# Patient Record
Sex: Female | Born: 1985 | Race: White | Hispanic: No | Marital: Married | Smoking: Never smoker
Health system: Southern US, Community
[De-identification: ages and names within clinical notes are randomized; demographics above are authoritative.]

## PROBLEM LIST (undated history)

## (undated) ENCOUNTER — Inpatient Hospital Stay (HOSPITAL_COMMUNITY): Payer: Self-pay

## (undated) DIAGNOSIS — Z973 Presence of spectacles and contact lenses: Secondary | ICD-10-CM

## (undated) DIAGNOSIS — R1310 Dysphagia, unspecified: Secondary | ICD-10-CM

## (undated) DIAGNOSIS — F329 Major depressive disorder, single episode, unspecified: Secondary | ICD-10-CM

## (undated) DIAGNOSIS — N289 Disorder of kidney and ureter, unspecified: Secondary | ICD-10-CM

## (undated) DIAGNOSIS — C4490 Unspecified malignant neoplasm of skin, unspecified: Secondary | ICD-10-CM

## (undated) DIAGNOSIS — Z5189 Encounter for other specified aftercare: Secondary | ICD-10-CM

## (undated) DIAGNOSIS — T7840XA Allergy, unspecified, initial encounter: Secondary | ICD-10-CM

## (undated) DIAGNOSIS — Z9889 Other specified postprocedural states: Secondary | ICD-10-CM

## (undated) DIAGNOSIS — L309 Dermatitis, unspecified: Secondary | ICD-10-CM

## (undated) DIAGNOSIS — R112 Nausea with vomiting, unspecified: Secondary | ICD-10-CM

## (undated) DIAGNOSIS — K589 Irritable bowel syndrome without diarrhea: Secondary | ICD-10-CM

## (undated) DIAGNOSIS — F419 Anxiety disorder, unspecified: Secondary | ICD-10-CM

## (undated) DIAGNOSIS — D649 Anemia, unspecified: Secondary | ICD-10-CM

## (undated) DIAGNOSIS — C439 Malignant melanoma of skin, unspecified: Secondary | ICD-10-CM

## (undated) DIAGNOSIS — F32A Depression, unspecified: Secondary | ICD-10-CM

## (undated) DIAGNOSIS — N200 Calculus of kidney: Secondary | ICD-10-CM

## (undated) DIAGNOSIS — E282 Polycystic ovarian syndrome: Secondary | ICD-10-CM

## (undated) DIAGNOSIS — Z889 Allergy status to unspecified drugs, medicaments and biological substances status: Secondary | ICD-10-CM

## (undated) DIAGNOSIS — Z1371 Encounter for nonprocreative screening for genetic disease carrier status: Secondary | ICD-10-CM

## (undated) HISTORY — DX: Dermatitis, unspecified: L30.9

## (undated) HISTORY — DX: Unspecified malignant neoplasm of skin, unspecified: C44.90

## (undated) HISTORY — DX: Allergy, unspecified, initial encounter: T78.40XA

## (undated) HISTORY — DX: Depression, unspecified: F32.A

## (undated) HISTORY — PX: ABDOMINOPLASTY: SUR9

## (undated) HISTORY — PX: WISDOM TOOTH EXTRACTION: SHX21

## (undated) HISTORY — PX: COSMETIC SURGERY: SHX468

## (undated) HISTORY — DX: Disorder of kidney and ureter, unspecified: N28.9

## (undated) HISTORY — PX: OTHER SURGICAL HISTORY: SHX169

## (undated) HISTORY — DX: Malignant melanoma of skin, unspecified: C43.9

## (undated) HISTORY — DX: Major depressive disorder, single episode, unspecified: F32.9

## (undated) HISTORY — DX: Encounter for other specified aftercare: Z51.89

## (undated) HISTORY — PX: TONSILLECTOMY AND ADENOIDECTOMY: SHX28

## (undated) HISTORY — PX: TONSILLECTOMY: SUR1361

## (undated) HISTORY — DX: Encounter for nonprocreative screening for genetic disease carrier status: Z13.71

## (undated) HISTORY — DX: Anxiety disorder, unspecified: F41.9

## (undated) HISTORY — DX: Irritable bowel syndrome, unspecified: K58.9

## (undated) HISTORY — DX: Dysphagia, unspecified: R13.10

## (undated) HISTORY — PX: OVARIAN CYST REMOVAL: SHX89

## (undated) HISTORY — PX: DIAGNOSTIC LAPAROSCOPY: SUR761

---
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
## 2006-01-10 ENCOUNTER — Observation Stay: Payer: Self-pay | Admitting: Obstetrics and Gynecology

## 2006-03-24 ENCOUNTER — Observation Stay: Payer: Self-pay | Admitting: Obstetrics and Gynecology

## 2006-04-05 HISTORY — PX: OTHER SURGICAL HISTORY: SHX169

## 2006-04-12 ENCOUNTER — Inpatient Hospital Stay: Payer: Self-pay | Admitting: Obstetrics and Gynecology

## 2007-02-02 DIAGNOSIS — R635 Abnormal weight gain: Secondary | ICD-10-CM | POA: Insufficient documentation

## 2007-04-08 ENCOUNTER — Emergency Department: Payer: Self-pay | Admitting: Emergency Medicine

## 2007-05-14 ENCOUNTER — Emergency Department: Payer: Self-pay | Admitting: Emergency Medicine

## 2007-07-16 ENCOUNTER — Emergency Department: Payer: Self-pay | Admitting: Internal Medicine

## 2008-07-12 ENCOUNTER — Emergency Department: Payer: Self-pay | Admitting: Emergency Medicine

## 2008-10-23 ENCOUNTER — Ambulatory Visit: Payer: Self-pay | Admitting: Obstetrics and Gynecology

## 2008-10-23 ENCOUNTER — Encounter: Payer: Self-pay | Admitting: Obstetrics & Gynecology

## 2008-10-23 LAB — CONVERTED CEMR LAB
Antibody Screen: NEGATIVE
Basophils Absolute: 0 10*3/uL (ref 0.0–0.1)
Eosinophils Relative: 2 % (ref 0–5)
HCT: 36.9 % (ref 36.0–46.0)
Hemoglobin: 11.9 g/dL — ABNORMAL LOW (ref 12.0–15.0)
Lymphs Abs: 2.4 10*3/uL (ref 0.7–4.0)
Monocytes Absolute: 0.7 10*3/uL (ref 0.1–1.0)
Monocytes Relative: 11 % (ref 3–12)
Neutrophils Relative %: 51 % (ref 43–77)
Platelets: 177 10*3/uL (ref 150–400)
Rh Type: POSITIVE
WBC: 6.7 10*3/uL (ref 4.0–10.5)

## 2008-10-31 ENCOUNTER — Ambulatory Visit (HOSPITAL_COMMUNITY): Admission: RE | Admit: 2008-10-31 | Discharge: 2008-10-31 | Payer: Self-pay | Admitting: Gynecology

## 2008-11-05 ENCOUNTER — Ambulatory Visit: Payer: Self-pay | Admitting: Obstetrics & Gynecology

## 2008-11-05 ENCOUNTER — Other Ambulatory Visit: Admission: RE | Admit: 2008-11-05 | Discharge: 2008-11-05 | Payer: Self-pay | Admitting: Obstetrics and Gynecology

## 2008-11-05 ENCOUNTER — Encounter: Payer: Self-pay | Admitting: Obstetrics and Gynecology

## 2008-12-11 ENCOUNTER — Ambulatory Visit: Payer: Self-pay | Admitting: Obstetrics & Gynecology

## 2008-12-11 ENCOUNTER — Ambulatory Visit: Payer: Self-pay | Admitting: Obstetrics and Gynecology

## 2008-12-16 ENCOUNTER — Ambulatory Visit: Payer: Self-pay | Admitting: Obstetrics and Gynecology

## 2008-12-23 ENCOUNTER — Ambulatory Visit: Payer: Self-pay | Admitting: Obstetrics and Gynecology

## 2008-12-30 ENCOUNTER — Ambulatory Visit: Payer: Self-pay | Admitting: Obstetrics and Gynecology

## 2008-12-31 ENCOUNTER — Encounter: Payer: Self-pay | Admitting: Obstetrics and Gynecology

## 2009-01-08 ENCOUNTER — Ambulatory Visit (HOSPITAL_COMMUNITY): Admission: RE | Admit: 2009-01-08 | Discharge: 2009-01-08 | Payer: Self-pay | Admitting: Obstetrics & Gynecology

## 2009-01-09 ENCOUNTER — Ambulatory Visit: Payer: Self-pay | Admitting: Obstetrics and Gynecology

## 2009-01-21 ENCOUNTER — Ambulatory Visit: Payer: Self-pay | Admitting: Obstetrics & Gynecology

## 2009-01-22 ENCOUNTER — Encounter: Payer: Self-pay | Admitting: Obstetrics and Gynecology

## 2009-02-01 ENCOUNTER — Emergency Department: Payer: Self-pay | Admitting: Emergency Medicine

## 2009-02-12 ENCOUNTER — Ambulatory Visit: Payer: Self-pay | Admitting: Family Medicine

## 2009-03-04 ENCOUNTER — Ambulatory Visit: Payer: Self-pay | Admitting: Family Medicine

## 2009-03-04 ENCOUNTER — Inpatient Hospital Stay (HOSPITAL_COMMUNITY): Admission: AD | Admit: 2009-03-04 | Discharge: 2009-03-04 | Payer: Self-pay | Admitting: Obstetrics & Gynecology

## 2009-03-12 ENCOUNTER — Ambulatory Visit: Payer: Self-pay | Admitting: Obstetrics & Gynecology

## 2009-03-12 LAB — CONVERTED CEMR LAB
MCHC: 30.5 g/dL (ref 30.0–36.0)
Platelets: 163 10*3/uL (ref 150–400)

## 2009-03-31 ENCOUNTER — Ambulatory Visit: Payer: Self-pay | Admitting: Obstetrics & Gynecology

## 2009-04-22 ENCOUNTER — Ambulatory Visit: Payer: Self-pay | Admitting: Obstetrics & Gynecology

## 2009-05-04 DIAGNOSIS — IMO0001 Reserved for inherently not codable concepts without codable children: Secondary | ICD-10-CM

## 2009-05-04 DIAGNOSIS — Z5189 Encounter for other specified aftercare: Secondary | ICD-10-CM

## 2009-05-04 HISTORY — DX: Reserved for inherently not codable concepts without codable children: IMO0001

## 2009-05-04 HISTORY — DX: Encounter for other specified aftercare: Z51.89

## 2009-05-06 ENCOUNTER — Ambulatory Visit: Payer: Self-pay | Admitting: Obstetrics & Gynecology

## 2009-05-13 ENCOUNTER — Ambulatory Visit: Payer: Self-pay | Admitting: Obstetrics & Gynecology

## 2009-05-13 LAB — CONVERTED CEMR LAB
Chlamydia, DNA Probe: NEGATIVE
GC Probe Amp, Genital: NEGATIVE

## 2009-05-19 ENCOUNTER — Ambulatory Visit: Payer: Self-pay | Admitting: Obstetrics & Gynecology

## 2009-05-19 ENCOUNTER — Encounter: Payer: Self-pay | Admitting: Family Medicine

## 2009-05-19 ENCOUNTER — Observation Stay (HOSPITAL_COMMUNITY): Admission: RE | Admit: 2009-05-19 | Discharge: 2009-05-19 | Payer: Self-pay | Admitting: Family Medicine

## 2009-06-23 ENCOUNTER — Encounter (INDEPENDENT_AMBULATORY_CARE_PROVIDER_SITE_OTHER): Payer: Self-pay | Admitting: *Deleted

## 2009-06-23 LAB — CONVERTED CEMR LAB
HCT: 35.8 % — ABNORMAL LOW (ref 36.0–46.0)
RBC: 4.07 M/uL (ref 3.87–5.11)
WBC: 6.6 10*3/uL (ref 4.0–10.5)

## 2009-06-25 ENCOUNTER — Ambulatory Visit: Payer: Self-pay | Admitting: Obstetrics & Gynecology

## 2009-06-30 ENCOUNTER — Ambulatory Visit: Payer: Self-pay | Admitting: Obstetrics and Gynecology

## 2009-06-30 ENCOUNTER — Ambulatory Visit (HOSPITAL_COMMUNITY): Admission: RE | Admit: 2009-06-30 | Discharge: 2009-06-30 | Payer: Self-pay | Admitting: Obstetrics and Gynecology

## 2009-06-30 ENCOUNTER — Encounter: Payer: Self-pay | Admitting: Obstetrics & Gynecology

## 2009-06-30 LAB — CONVERTED CEMR LAB
Platelets: 280 10*3/uL (ref 150–400)
RDW: 16.6 % — ABNORMAL HIGH (ref 11.5–15.5)
WBC: 6.8 10*3/uL (ref 4.0–10.5)

## 2009-07-10 ENCOUNTER — Ambulatory Visit: Payer: Self-pay | Admitting: Obstetrics and Gynecology

## 2009-07-10 ENCOUNTER — Encounter: Payer: Self-pay | Admitting: Obstetrics & Gynecology

## 2009-07-10 LAB — CONVERTED CEMR LAB
HCT: 33.1 % — ABNORMAL LOW (ref 36.0–46.0)
Hemoglobin: 9.9 g/dL — ABNORMAL LOW (ref 12.0–15.0)
MCHC: 29.9 g/dL — ABNORMAL LOW (ref 30.0–36.0)
MCV: 87.1 fL (ref 78.0–100.0)
Platelets: 295 10*3/uL (ref 150–400)
RBC: 3.8 M/uL — ABNORMAL LOW (ref 3.87–5.11)
WBC: 6.7 10*3/uL (ref 4.0–10.5)

## 2009-11-13 ENCOUNTER — Ambulatory Visit: Payer: Self-pay | Admitting: Obstetrics & Gynecology

## 2009-11-14 ENCOUNTER — Encounter: Payer: Self-pay | Admitting: Family Medicine

## 2009-12-15 ENCOUNTER — Encounter: Payer: Self-pay | Admitting: Obstetrics & Gynecology

## 2010-03-23 ENCOUNTER — Encounter: Payer: Self-pay | Admitting: Family Medicine

## 2010-03-23 ENCOUNTER — Other Ambulatory Visit: Payer: Commercial Indemnity

## 2010-03-23 DIAGNOSIS — N949 Unspecified condition associated with female genital organs and menstrual cycle: Secondary | ICD-10-CM

## 2010-03-23 LAB — RPR: RPR Ser Ql: NONREACTIVE

## 2010-03-23 LAB — CONVERTED CEMR LAB: TSH: 1.041 microintl units/mL (ref 0.350–4.500)

## 2010-03-23 LAB — CBC: Platelets: 118 10*3/uL — ABNORMAL LOW (ref 150–400)

## 2010-03-30 LAB — GC/CHLAMYDIA PROBE AMP, GENITAL
Chlamydia, DNA Probe: NEGATIVE
GC Probe Amp, Genital: NEGATIVE

## 2010-03-30 LAB — URINALYSIS, ROUTINE W REFLEX MICROSCOPIC
Bilirubin Urine: NEGATIVE
Glucose, UA: NEGATIVE mg/dL
Hgb urine dipstick: NEGATIVE
Protein, ur: NEGATIVE mg/dL
Specific Gravity, Urine: 1.025 (ref 1.005–1.030)

## 2010-03-30 LAB — WET PREP, GENITAL
Trich, Wet Prep: NONE SEEN
Yeast Wet Prep HPF POC: NONE SEEN

## 2010-03-30 LAB — CBC
HCT: 30.8 % — ABNORMAL LOW (ref 36.0–46.0)
Hemoglobin: 10.2 g/dL — ABNORMAL LOW (ref 12.0–15.0)
MCHC: 33.2 g/dL (ref 30.0–36.0)
MCV: 91 fL (ref 78.0–100.0)
Platelets: 138 10*3/uL — ABNORMAL LOW (ref 150–400)
RBC: 3.39 MIL/uL — ABNORMAL LOW (ref 3.87–5.11)
RDW: 12.6 % (ref 11.5–15.5)
WBC: 10.3 10*3/uL (ref 4.0–10.5)

## 2010-03-30 LAB — STREP B DNA PROBE: Strep Group B Ag: NEGATIVE

## 2010-05-12 ENCOUNTER — Ambulatory Visit (INDEPENDENT_AMBULATORY_CARE_PROVIDER_SITE_OTHER): Payer: Commercial Indemnity | Admitting: Family Medicine

## 2010-05-12 DIAGNOSIS — N979 Female infertility, unspecified: Secondary | ICD-10-CM

## 2010-05-15 NOTE — Assessment & Plan Note (Signed)
NAME:  Debra Hernandez NO.:  [id number]  MEDICAL RECORD NO.:  [id number]           PATIENT TYPE:  LOCATION:  [facility] at [facility]           FACILITY:  PHYSICIAN:  Tinnie Gens, MD        DATE OF BIRTH:  01-Sep-1985  DATE OF SERVICE:  05/12/2010                                 CLINIC NOTE  CHIEF COMPLAINT:  Infertility consult.  HISTORY OF PRESENT ILLNESS:  The patient is a 25 year old gravida 2, para 2-0-0-1 who has a history of 8 months of infertility.  She has previously had 2 normal pregnancies and she had no trouble conceiving. Her last pregnancy ended in a neonatal demise, the baby was found to have hydropic change at the end of her pregnancy, she went to [facility] to deliver and the baby had a really low EF in the 5% to 7% range and was profoundly in heart failure, some breathing too did not make it. He lived approximately 12 days.  Had a C-section for that delivery as she would not go to tolerate labor.  She does not recall any other issues.  She did bleed rather profoundly post delivery and required blood transfusion.  She actually came into this office and we ordered an ultrasound that showed no retained products of conception.  She says she has not had a D and C or anything else.  She does not note any trouble from her C-section.  In reviewing her records, I do not see any indication that there is any trouble with section.  She is having monthly cycles, which are normal.  She has her same partner who is the father for other 2 kids.  So they have been trying to get pregnant for approximately 8 months.  She has been using ovulation prediction kits, which have shown her to be ovulating.  She does have timed intercourse. She is worried that her weight, which has gone up to 190 pounds and/or a history of PCOS may be causing her issues.  Lengthy discussion was had with this patient, approximately 25 months was spent doing counseling. We discussed with her the  three main causes of infertility including tubal problem, ovulation problem, and/or sperm problem.  She had a TSH recently back in March, which was normal.  She had another TSH in December, which was also normal.  I think that she has normal prolactin as well.  She has been using ovulation prediction kits, which showed her to be ovulating, so I do not think that is the problem. Because she had a surgery, I think we probably should look at an HSG just to be sure everything is fine and then make sure the cavity and tubes are normal and then semen analysis can be performed on her partner.  We will check FSH and AMH today.  If all of this comes back negative, we can discuss whether she would want to try Clomid versus referral to REI.  We did discuss few more factors as etiologies of  infertility. We discussed the possibility of endometriosis, but I  do not think that this is her problem.  She does not really have a history suggestive of this, but I do not  have another explanation for why she has not gotten pregnant, although we did discuss that really 12 months is the normal time for workup for infertility, but she seems fairly adamant that something must be wrong, so we will begin the workup process.          ______________________________ Tinnie Gens, MD    TP/MEDQ  D:  05/12/2010  T:  05/12/2010  Job:  [id number]

## 2010-05-19 NOTE — Assessment & Plan Note (Signed)
NAME:  Debra Hernandez, Debra Hernandez NO.:  [id number]   MEDICAL RECORD NO.:  [id number]          PATIENT TYPE:  POB   LOCATION:  [facility] at [facility]         FACILITY:  [facility]   PHYSICIAN:  Scheryl Darter, MD       DATE OF BIRTH:  1985/03/24   DATE OF SERVICE:  06/25/2009                                  CLINIC NOTE   The patient returns for postpartum visit.  She is postoperative from a  cesarean section performed at [facility] on May 20, 2009.  She was  delivered at 37 weeks' gestation due to fetal hydrops and hydramnios.  Baby was a female with a congestive heart failure, a congenital heart  condition, and baby has expired on Jun 01, 2009.  She is still waiting  for genetic results.  She requests a NuvaRing for contraception.  She  has a small amount of bleeding postpartum.  She says that her incision  is healing well.   PHYSICAL EXAMINATION:  GENERAL:  She appears well with normal affect.  VITAL SIGNS:  Weight is 201 pounds, BP 116/75, pulse 79.  ABDOMEN:  Nondistended.  No mass.  Well-healed transverse lower  abdominal incision.  PELVIC:  External genitalia, vagina, and cervix was notable for small  amount of vaginal bleeding.  Uterus, normal size, nontender, no mass.   Hemoglobin on June 23, 2009, was 10.8.  This is stable from March.  She  was anemic postoperatively and required transfusion.   IMPRESSION:  1. Postoperative from cesarean section.  2. Status post fetal death secondary to congenital heart condition.   PLAN:  Gave prescription for NuvaRing.  She will start this soon.  I  recommend she return for yearly exam in about 6 months.      Scheryl Darter, MD     JA/MEDQ  D:  06/25/2009  T:  06/25/2009  Job:  [id number]

## 2010-05-19 NOTE — Assessment & Plan Note (Signed)
NAME:  Debra Hernandez, FORNER NO.:  [id number]   MEDICAL RECORD NO.:  [id number]          PATIENT TYPE:  OUT   LOCATION:  [facility] at [facility]          FACILITY:  [facility]   PHYSICIAN:  Argentina Donovan, MD        DATE OF BIRTH:  11/14/1985   DATE OF SERVICE:  06/30/2009                                  CLINIC NOTE   The patient is a 25 year old gravida 2, para 1-1-0-1, who was delivered  at [facility] on May 20, 2009, of a hydroptic baby that lived 11 days,  she was delivered by cesarean section for fetal indication.  When she  got to her room after the C-section, she began bleeding heavily and  received 2 units of blood transfusion.  They did not take her back and  do any surgical procedure.  She then shortly thereafter start bleeding  for about a week and half to 2 weeks.  She did pump her breasts and  because the baby did live for 12 days and the date at the baby died she  stopped pumping and uses frozen cabbage leaves to treat the lactating  breasts.  She still continued to spot or bleed after the first couple  weeks on and off, sometimes heavy passage of clots, and other times just  spotting, and then few days before she came in here 1 week ago, the  bleeding picked up and was heavier.  She had a H and H at that time  which was hemoglobin of 10.8 with a hematocrit of 35.8.  She was given a  NuvaRing which she inserted but took out 2 days ago because of bleeding  got heavier, and kept washing out the NuvaRing.  Over this past day, she  has had hours where she had to change the pad every hour because it was  saturated and other times was slow down just a bit.  When she was  examined 1 week ago, there was just a small amount of blood in the  vagina.  Today, she was bleeding fairly briskly like a heavy period.  On  physical examination, the uterus palpated, pretty firm, I would say  upper limits of normal in size, but not appreciably boggy.  The cervix  was closed and there was  amount of blood extruding from the cervix was  similar to that of a moderate to heavy period, and it is highly unlikely  if she had any changes of counting at this point, but I am going to send  her for an ultrasound.  I am also going to put her on 800 p.o. of  Cytotec and progesterone, Provera 10 mg b.i.d.  If she continues to  bleed and this does not let up, I am going to have her come back in 2  days, then I think that D and C would be an order regardless of the  ultrasound result.   IMPRESSION:  Dysfunctional postpartum bleeding.  Physical exam is  generally normal.  The CBC, however, will be repeated today.           ______________________________  Argentina Donovan, MD     PR/MEDQ  D:  06/30/2009  T:  07/01/2009  Job:  [id number]

## 2010-06-03 ENCOUNTER — Other Ambulatory Visit (INDEPENDENT_AMBULATORY_CARE_PROVIDER_SITE_OTHER): Payer: Commercial Indemnity

## 2010-06-03 DIAGNOSIS — Z3480 Encounter for supervision of other normal pregnancy, unspecified trimester: Secondary | ICD-10-CM

## 2010-06-03 LAB — RPR: RPR: NONREACTIVE

## 2010-06-03 LAB — HIV ANTIBODY (ROUTINE TESTING W REFLEX): HIV: NONREACTIVE

## 2010-06-04 ENCOUNTER — Encounter (INDEPENDENT_AMBULATORY_CARE_PROVIDER_SITE_OTHER): Payer: Commercial Indemnity | Admitting: Family Medicine

## 2010-06-04 DIAGNOSIS — Z3480 Encounter for supervision of other normal pregnancy, unspecified trimester: Secondary | ICD-10-CM

## 2010-06-04 DIAGNOSIS — N912 Amenorrhea, unspecified: Secondary | ICD-10-CM

## 2010-06-05 ENCOUNTER — Other Ambulatory Visit: Payer: Commercial Indemnity

## 2010-06-10 ENCOUNTER — Other Ambulatory Visit: Payer: Self-pay | Admitting: Family Medicine

## 2010-06-10 ENCOUNTER — Other Ambulatory Visit (INDEPENDENT_AMBULATORY_CARE_PROVIDER_SITE_OTHER): Payer: Commercial Indemnity

## 2010-06-10 DIAGNOSIS — Z3480 Encounter for supervision of other normal pregnancy, unspecified trimester: Secondary | ICD-10-CM

## 2010-06-10 DIAGNOSIS — O3680X0 Pregnancy with inconclusive fetal viability, not applicable or unspecified: Secondary | ICD-10-CM

## 2010-06-17 ENCOUNTER — Encounter (INDEPENDENT_AMBULATORY_CARE_PROVIDER_SITE_OTHER): Payer: Commercial Indemnity | Admitting: Obstetrics and Gynecology

## 2010-06-17 ENCOUNTER — Ambulatory Visit (HOSPITAL_COMMUNITY): Payer: Commercial Indemnity

## 2010-06-17 ENCOUNTER — Ambulatory Visit (HOSPITAL_COMMUNITY)
Admission: RE | Admit: 2010-06-17 | Discharge: 2010-06-17 | Disposition: A | Discharge status: Home / Self Care | Payer: Commercial Indemnity | Source: Ambulatory Visit | Attending: Family Medicine | Admitting: Family Medicine

## 2010-06-17 DIAGNOSIS — O3680X0 Pregnancy with inconclusive fetal viability, not applicable or unspecified: Secondary | ICD-10-CM

## 2010-06-17 DIAGNOSIS — O09299 Supervision of pregnancy with other poor reproductive or obstetric history, unspecified trimester: Secondary | ICD-10-CM | POA: Insufficient documentation

## 2010-06-17 DIAGNOSIS — O352XX0 Maternal care for (suspected) hereditary disease in fetus, not applicable or unspecified: Secondary | ICD-10-CM | POA: Insufficient documentation

## 2010-06-17 DIAGNOSIS — O209 Hemorrhage in early pregnancy, unspecified: Secondary | ICD-10-CM | POA: Insufficient documentation

## 2010-06-17 DIAGNOSIS — Z113 Encounter for screening for infections with a predominantly sexual mode of transmission: Secondary | ICD-10-CM

## 2010-06-17 DIAGNOSIS — Z3480 Encounter for supervision of other normal pregnancy, unspecified trimester: Secondary | ICD-10-CM

## 2010-06-17 DIAGNOSIS — Z1272 Encounter for screening for malignant neoplasm of vagina: Secondary | ICD-10-CM

## 2010-06-18 ENCOUNTER — Other Ambulatory Visit (HOSPITAL_COMMUNITY): Payer: Commercial Indemnity

## 2010-06-18 ENCOUNTER — Ambulatory Visit (HOSPITAL_COMMUNITY): Admission: RE | Admit: 2010-06-18 | Payer: Commercial Indemnity | Source: Ambulatory Visit

## 2010-06-18 ENCOUNTER — Encounter: Payer: Commercial Indemnity | Admitting: Obstetrics & Gynecology

## 2010-07-16 ENCOUNTER — Encounter: Payer: Commercial Indemnity | Admitting: Obstetrics & Gynecology

## 2010-07-21 ENCOUNTER — Encounter (INDEPENDENT_AMBULATORY_CARE_PROVIDER_SITE_OTHER): Payer: Commercial Indemnity | Admitting: Family Medicine

## 2010-07-21 ENCOUNTER — Other Ambulatory Visit: Payer: Self-pay | Admitting: Family Medicine

## 2010-07-21 DIAGNOSIS — Z3682 Encounter for antenatal screening for nuchal translucency: Secondary | ICD-10-CM

## 2010-07-21 DIAGNOSIS — Z3480 Encounter for supervision of other normal pregnancy, unspecified trimester: Secondary | ICD-10-CM

## 2010-07-31 ENCOUNTER — Ambulatory Visit (HOSPITAL_COMMUNITY)
Admission: RE | Admit: 2010-07-31 | Discharge: 2010-07-31 | Disposition: A | Discharge status: Home / Self Care | Payer: Commercial Indemnity | Source: Ambulatory Visit | Attending: Family Medicine | Admitting: Family Medicine

## 2010-07-31 ENCOUNTER — Encounter (HOSPITAL_COMMUNITY): Payer: Self-pay

## 2010-07-31 ENCOUNTER — Other Ambulatory Visit: Payer: Self-pay | Admitting: Family Medicine

## 2010-07-31 ENCOUNTER — Other Ambulatory Visit: Payer: Self-pay | Admitting: Maternal and Fetal Medicine

## 2010-07-31 DIAGNOSIS — O351XX0 Maternal care for (suspected) chromosomal abnormality in fetus, not applicable or unspecified: Secondary | ICD-10-CM | POA: Insufficient documentation

## 2010-07-31 DIAGNOSIS — O2690 Pregnancy related conditions, unspecified, unspecified trimester: Secondary | ICD-10-CM

## 2010-07-31 DIAGNOSIS — O34219 Maternal care for unspecified type scar from previous cesarean delivery: Secondary | ICD-10-CM | POA: Insufficient documentation

## 2010-07-31 DIAGNOSIS — O352XX0 Maternal care for (suspected) hereditary disease in fetus, not applicable or unspecified: Secondary | ICD-10-CM | POA: Insufficient documentation

## 2010-07-31 DIAGNOSIS — O3510X0 Maternal care for (suspected) chromosomal abnormality in fetus, unspecified, not applicable or unspecified: Secondary | ICD-10-CM | POA: Insufficient documentation

## 2010-07-31 DIAGNOSIS — O09299 Supervision of pregnancy with other poor reproductive or obstetric history, unspecified trimester: Secondary | ICD-10-CM | POA: Insufficient documentation

## 2010-07-31 DIAGNOSIS — Z3682 Encounter for antenatal screening for nuchal translucency: Secondary | ICD-10-CM

## 2010-07-31 NOTE — ED Notes (Signed)
First screen drawn.

## 2010-07-31 NOTE — Progress Notes (Signed)
Genetic Counseling  High-Risk Gestation Note  Appointment Date:  07/31/2010 Referred By: Reva Bores, MD Date of Birth:  1985-03-28  Pregnancy History: G3P2001 Estimated Date of Delivery: 02/09/11 Estimated Gestational Age: [redacted]w[redacted]d  I met with Mrs. Daralene Milch for prenatal genetic counseling given a previous son with an identified gene variant in a gene associated with dilated cardiomyopathy.   Both family histories were reviewed and found to be contributory for a previous son who passed away at 12 days. Hydrops was identified at [redacted] weeks gestation, and the patient delivered at [redacted] weeks gestation. He reportedly had congestive heart failure. 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 Both the patient and her partner have had negative testing for the identified variant in the MYH7 gene. Dilated cardiomyopathy can include congestive heart failure, reduced cardiac output, arrhythmias, and/or thromboembolic disease and can be due to genetic or acquired causes. Various genes have been identified to be involved with familial dilated cardiomyopathy. 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 No additional relatives were reported in the family history with possible symptoms of dilated cardiomyopathy.   We discussed that recurrence risk for the variant in the MYH7 gene in the current pregnancy is likely low given the negative testing for the patient and her partner but would be increased above the general population risk, given that we cannot rule out germline mosaicism. Prenatal diagnosis for the MYH7 gene variant is available via CVS or amniocentesis. The risks, benefits, and limitations of these tests were reviewed. However, it is not clear that the presence or absence of this  gene variant would alter the plan for pregnancy management in the current pregnancy. Additionally the risks, benefits, and limitations of prenatal diagnosis for a condition with a range of features and age of onset (including adult onset) were discussed. We also discussed that it is not clear from the available information whether or not this underlying gene variant was contributive and/or causative for the hydrops present in her previous pregnancy. We discussed that ultrasound surveillance would be available in the pregnancy to assess for development of hydrops as well as fetal echocardiogram to assess the fetal heart in more detail.  Without further information regarding the provided family history, an accurate genetic risk cannot be calculated.   Further genetic counseling is warranted if more information is obtained.    She was counseled regarding screening options including: First Screen, Integrated Screen, and detailed ultrasound. The risks, benefits, and limitations of each of these options were reviewed in detail.  It was discussed that not all birth defects can be identified with these procedures. She indicated that She understood these risks and decided to continue with First Screen at the time of today's visit.  She understands that although the ultrasound may appear normal, the risk of anomalies cannot be completely eliminated.   She denied exposure to environmental toxins or chemical agents.  She denied the use of alcohol, tobacco or street drugs.  She denied significant viral illnesses during the course of her pregnancy.  Her medical and surgical history were noncontributory.   A complete obstetrical ultrasound was performed at the time of today's evaluation.  The ultrasound report is reported separately.    We counseled the patient for approximately 20 minutes regarding the above risks.  Clydie Braun Uva Runkel, MS, [facility] 07/31/2010

## 2010-08-12 ENCOUNTER — Ambulatory Visit (INDEPENDENT_AMBULATORY_CARE_PROVIDER_SITE_OTHER): Payer: Commercial Indemnity | Admitting: Family Medicine

## 2010-08-12 ENCOUNTER — Encounter: Payer: Self-pay | Admitting: Family Medicine

## 2010-08-12 DIAGNOSIS — O34219 Maternal care for unspecified type scar from previous cesarean delivery: Secondary | ICD-10-CM | POA: Insufficient documentation

## 2010-08-12 DIAGNOSIS — Z3480 Encounter for supervision of other normal pregnancy, unspecified trimester: Secondary | ICD-10-CM | POA: Insufficient documentation

## 2010-08-12 NOTE — Progress Notes (Signed)
Doing well--Had first screen, with normal nuchal translucency, will need AFP at next visit. Subjective:    Debra Hernandez is a 25 y.o. G3P2001 [redacted]w[redacted]d being seen today for her obstetrical visit.  Patient reports no complaints. Fetal movement: not present yet.  Objective:    BP 96/67  Wt 191 lb (86.637 kg)  LMP 05/05/2010  Physical Exam  Exam  FHT:  155 BPM  Uterine Size: size equals dates  Presentation: unknown     Assessment:    Pregnancy:  G3P2001    Plan:    Patient Active Problem List  Diagnoses  . Supervision of other normal pregnancy  . Previous cesarean delivery, antepartum condition or complication  . Fetal hydrops    see notes Follow up in 3 wks.

## 2010-08-12 NOTE — Patient Instructions (Signed)
Pregnancy - Second Trimester The second trimester of pregnancy (3 to 6 months) is a period of rapid growth for you and your baby. At the end of the sixth month, your baby is about 9 inches long and weighs 1 1/2 pounds. You will begin to feel the baby move between 18 and 20 weeks of the pregnancy. This is called quickening. Weight gain is faster. A clear fluid (colostrum) may leak out of your breasts. You may feel small contractions of the womb (uterus). This is known as false labor or Braxton-Hicks contractions. This is like a practice for labor when the baby is ready to be born. Usually, the problems with morning sickness have usually passed by the end of your first trimester. Some women develop small dark blotches (called cholasma, mask of pregnancy) on their face that usually goes away after the baby is born. Exposure to the sun makes the blotches worse. Acne may also develop in some pregnant women and pregnant women who have acne, may find that it goes away. PRENATAL EXAMS  Blood work may continue to be done during prenatal exams. These tests are done to check on your health and the probable health of your baby. Blood work is used to follow your blood levels (hemoglobin). Anemia (low hemoglobin) is common during pregnancy. Iron and vitamins are given to help prevent this. You will also be checked for diabetes between 24 and 28 weeks of the pregnancy. Some of the previous blood tests may be repeated.   The size of the uterus is measured during each visit. This is to make sure that the baby is continuing to grow properly according to the dates of the pregnancy.   Your blood pressure is checked every prenatal visit. This is to make sure you are not getting toxemia.   Your urine is checked to make sure you do not have an infection, diabetes or protein in the urine.   Your weight is checked often to make sure gains are happening at the suggested rate. This is to ensure that both you and your baby are  growing normally.   Sometimes, an ultrasound is performed to confirm the proper growth and development of the baby. This is a test which bounces harmless sound waves off the baby so your caregiver can more accurately determine due dates.  Sometimes, a specialized test is done on the amniotic fluid surrounding the baby. This test is called an amniocentesis. The amniotic fluid is obtained by sticking a needle into the belly (abdomen). This is done to check the chromosomes in instances where there is a concern about possible genetic problems with the baby. It is also sometimes done near the end of pregnancy if an early delivery is required. In this case, it is done to help make sure the baby's lungs are mature enough for the baby to live outside of the womb. CHANGES OCCURING IN THE SECOND TRIMESTER OF PREGNANCY Your body goes through many changes during pregnancy. They vary from person to person. Talk to your caregiver about changes you notice that you are concerned about.  During the second trimester, you will likely have an increase in your appetite. It is normal to have cravings for certain foods. This varies from person to person and pregnancy to pregnancy.   Your lower abdomen will begin to bulge.   You may have to urinate more often because the uterus and baby are pressing on your bladder. It is also common to get more bladder infections during pregnancy (  pain with urination). You can help this by drinking lots of fluids and emptying your bladder before and after intercourse.   You may begin to get stretch marks on your hips, abdomen, and breasts. These are normal changes in the body during pregnancy. There are no exercises or medications to take that prevent this change.   You may begin to develop swollen and bulging veins (varicose veins) in your legs. Wearing support hose, elevating your feet for 15 minutes, 3 to 4 times a day and limiting salt in your diet helps lessen the problem.    Heartburn may develop as the uterus grows and pushes up against the stomach. Antacids recommended by your caregiver helps with this problem. Also, eating smaller meals 4 to 5 times a day helps.   Constipation can be treated with a stool softener or adding bulk to your diet. Drinking lots of fluids, vegetables, fruits, and whole grains are helpful.   Exercising is also helpful. If you have been very active up until your pregnancy, most of these activities can be continued during your pregnancy. If you have been less active, it is helpful to start an exercise program such as walking.   Hemorrhoids (varicose veins in the rectum) may develop at the end of the second trimester. Warm sitz baths and hemorrhoid cream recommended by your caregiver helps hemorrhoid problems.   Backaches may develop during this time of your pregnancy. Avoid heavy lifting, wear low heal shoes and practice good posture to help with backache problems.   Some pregnant women develop tingling and numbness of their hand and fingers because of swelling and tightening of ligaments in the wrist (carpel tunnel syndrome). This goes away after the baby is born.   As your breasts enlarge, you may have to get a bigger bra. Get a comfortable, cotton, support bra. Do not get a nursing bra until the last month of the pregnancy if you will be nursing the baby.   You may get a dark line from your belly button to the pubic area called the linea nigra.   You may develop rosy cheeks because of increase blood flow to the face.   You may develop spider looking lines of the face, neck, arms and chest. These go away after the baby is born.  HOME CARE INSTRUCTIONS  It is extremely important to avoid all smoking, herbs, alcohol, and un-prescribed drugs during your pregnancy. These chemicals affect the formation and growth of the baby. Avoid these chemicals throughout the pregnancy to ensure the delivery of a healthy infant.   Most of your home  care instructions are the same as suggested for the first trimester of your pregnancy. Keep your caregiver's appointments. Follow your caregiver's instructions regarding medication use, exercise and diet.   During pregnancy, you are providing food for you and your baby. Continue to eat regular, well-balanced meals. Choose foods such as meat, fish, milk and other low fat dairy products, vegetables, fruits, and whole-grain breads and cereals. Your caregiver will tell you of the ideal weight gain.   A physical sexual relationship may be continued up until near the end of pregnancy if there are no other problems. Problems could include early (premature) leaking of amniotic fluid from the membranes, vaginal bleeding, abdominal pain, or other medical or pregnancy problems.   Exercise regularly if there are no restrictions. Check with your caregiver if you are unsure of the safety of some of your exercises. The greatest weight gain will occur in the last   2 trimesters of pregnancy. Exercise will help you:   Control your weight.   Get you in shape for labor and delivery.   Lose weight after you have the baby.   Wear a good support or jogging bra for breast tenderness during pregnancy. This may help if worn during sleep. Pads or tissues may be used in the bra if you are leaking colostrum.   Do not use hot tubs, steam rooms or saunas throughout the pregnancy.   Wear your seat belt at all times when driving. This protects you and your baby if you are in an accident.   Avoid raw meat, uncooked cheese, cat litter boxes and soil used by cats. These carry germs that can cause birth defects in the baby.   The second trimester is also a good time to visit your dentist for your dental health if this has not been done yet. Getting your teeth cleaned is OK. Use a soft toothbrush. Brush gently during pregnancy.   It is easier to loose urine during pregnancy. Tightening up and strengthening the pelvic muscles will  help with this problem. Practice stopping your urination while you are going to the bathroom. These are the same muscles you need to strengthen. It is also the muscles you would use as if you were trying to stop from passing gas. You can practice tightening these muscles up 10 times a set and repeating this about 3 times per day. Once you know what muscles to tighten up, do not perform these exercises during urination. It is more likely to contribute to an infection by backing up the urine.   Ask for help if you have financial, counseling or nutritional needs during pregnancy. Your caregiver will be able to offer counseling for these needs as well as refer you for other special needs.   Your skin may become oily. If so, wash your face with mild soap, use non-greasy moisturizer and oil or cream based makeup.  MEDICATIONS AND DRUG USE IN PREGNANCY  Take prenatal vitamins as directed. The vitamin should contain 1 milligram of folic acid. Keep all vitamins out of reach of children. Only a couple vitamins or tablets containing iron may be fatal to a baby or young child when ingested.   Avoid use of all medications, including herbs, over-the-counter medications, not prescribed or suggested by your caregiver. Only take over-the-counter or prescription medicines for pain, discomfort, or fever as directed by your caregiver. Do not use aspirin.   Let your caregiver also know about herbs you may be using.   Alcohol is related to a number of birth defects. This includes fetal alcohol syndrome. All alcohol, in any form, should be avoided completely. Smoking will cause low birth rate and premature babies.   Street/illegal drugs are very harmful to the baby. They are absolutely forbidden. A baby born to an addicted mother will be addicted at birth. The baby will go through the same withdrawal an adult does.  SEEK MEDICAL CARE IF:  You have any concerns or worries during your pregnancy. It is better to call with  your questions if you feel they cannot wait, rather than worry about them.  SEEK IMMEDIATE MEDICAL CARE IF:  An unexplained oral temperature above 101 develops, or as your caregiver suggests.   You have leaking of fluid from the vagina (birth canal). If leaking membranes are suspected, take your temperature and tell your caregiver of this when you call.   There is vaginal spotting, bleeding, or passing   clots. Tell your caregiver of the amount and how many pads are used. Light spotting in pregnancy is common, especially following intercourse.   You develop a bad smelling vaginal discharge with a change in the color from clear to white.   You continue to feel sick to your stomach (nauseated) and have no relief from remedies suggested. You vomit blood or coffee ground like materials.   You loose more than 2 pounds of weight or gain more than 2 pounds of weight over a weeks time, or as suggested by your caregiver.   You notice swelling of your face, hands, and feet or legs.   You get exposed to German measles and have never had them.   You are exposed to fifth disease or chicken pox.   You develop belly (abdominal) pain. Round ligament discomfort is a common non-cancerous (benign) cause of abdominal pain in pregnancy. Your caregiver still must evaluate you.   You develop a bad headache that does not go away.   You develop fever, diarrhea, pain with urination or shortness of breath.   You develop visual problems, blurry or double vision.   You fall, are in a car accident or any kind of trauma.   There is mental or physical violence at home.  Document Released: 12/15/2000 Document Re-Released: 03/17/2009 [employer] Patient Information 2011 [employer]. 

## 2010-08-28 ENCOUNTER — Telehealth: Payer: Self-pay | Admitting: *Deleted

## 2010-08-28 DIAGNOSIS — O219 Vomiting of pregnancy, unspecified: Secondary | ICD-10-CM

## 2010-08-28 MED ORDER — PROMETHAZINE HCL 25 MG PO TABS: 25.0000 mg | ORAL_TABLET | Freq: Four times a day (QID) | ORAL | Status: AC | PRN

## 2010-08-28 NOTE — Telephone Encounter (Signed)
Patient has been very nauseated since yesterday, unable to keep anything down.  She will try Phenergan and if this does not work she will proceed to the hospital for further evaluation.  Her BP is 104/73 and pulse is 90.  Phenergan is called into [facility] [address].

## 2010-09-01 ENCOUNTER — Encounter: Payer: Self-pay | Admitting: Family Medicine

## 2010-09-01 ENCOUNTER — Ambulatory Visit (INDEPENDENT_AMBULATORY_CARE_PROVIDER_SITE_OTHER): Payer: Commercial Indemnity | Admitting: Family Medicine

## 2010-09-01 VITALS — BP 119/66 | Ht 68.0 in | Wt 196.0 lb

## 2010-09-01 DIAGNOSIS — Z3480 Encounter for supervision of other normal pregnancy, unspecified trimester: Secondary | ICD-10-CM

## 2010-09-01 NOTE — Progress Notes (Signed)
  Subjective:    Debra Hernandez is a 25 y.o. G3P2001 [redacted]w[redacted]d being seen today for her obstetrical visit.  Patient reports no complaints. Fetal movement: normal.  Objective:    BP 119/66  Ht 5\' 8"  (1.727 m)  Wt 88.905 kg (196 lb)  BMI 29.80 kg/m2  LMP 05/05/2010  Physical Exam  Exam  FHT:  147 BPM  Uterine Size: 17 cm  Presentation: unsure     Assessment:    Pregnancy:  G3P2001    Plan:    Patient Active Problem List  Diagnoses  . Supervision of other normal pregnancy  . Previous cesarean delivery, antepartum condition or complication  . Fetal hydrops    Discussed C-section vs. TOLAC Follow up in 4 weeks.

## 2010-09-01 NOTE — Progress Notes (Signed)
Doing well 

## 2010-09-03 ENCOUNTER — Encounter: Payer: Commercial Indemnity | Admitting: Obstetrics & Gynecology

## 2010-09-11 ENCOUNTER — Ambulatory Visit (HOSPITAL_COMMUNITY)
Admission: RE | Admit: 2010-09-11 | Discharge: 2010-09-11 | Disposition: A | Discharge status: Home / Self Care | Payer: Commercial Indemnity | Source: Ambulatory Visit | Attending: Family Medicine | Admitting: Family Medicine

## 2010-09-11 VITALS — BP 120/69 | HR 104 | Wt 197.0 lb

## 2010-09-11 DIAGNOSIS — Z1389 Encounter for screening for other disorder: Secondary | ICD-10-CM | POA: Insufficient documentation

## 2010-09-11 DIAGNOSIS — O34219 Maternal care for unspecified type scar from previous cesarean delivery: Secondary | ICD-10-CM | POA: Insufficient documentation

## 2010-09-11 DIAGNOSIS — O352XX0 Maternal care for (suspected) hereditary disease in fetus, not applicable or unspecified: Secondary | ICD-10-CM | POA: Insufficient documentation

## 2010-09-11 DIAGNOSIS — O2690 Pregnancy related conditions, unspecified, unspecified trimester: Secondary | ICD-10-CM

## 2010-09-11 DIAGNOSIS — O358XX0 Maternal care for other (suspected) fetal abnormality and damage, not applicable or unspecified: Secondary | ICD-10-CM | POA: Insufficient documentation

## 2010-09-11 DIAGNOSIS — O09299 Supervision of pregnancy with other poor reproductive or obstetric history, unspecified trimester: Secondary | ICD-10-CM | POA: Insufficient documentation

## 2010-09-11 DIAGNOSIS — Z363 Encounter for antenatal screening for malformations: Secondary | ICD-10-CM | POA: Insufficient documentation

## 2010-09-11 NOTE — Progress Notes (Signed)
Ultrasound in [facility].  Follow up U/S scheduled 

## 2010-09-15 ENCOUNTER — Inpatient Hospital Stay (HOSPITAL_COMMUNITY)
Admission: AD | Admit: 2010-09-15 | Discharge: 2010-09-15 | Disposition: A | Discharge status: Home / Self Care | Payer: Managed Care, Other (non HMO) | Source: Ambulatory Visit | Attending: Obstetrics & Gynecology | Admitting: Obstetrics & Gynecology

## 2010-09-15 DIAGNOSIS — O99891 Other specified diseases and conditions complicating pregnancy: Secondary | ICD-10-CM | POA: Insufficient documentation

## 2010-09-15 DIAGNOSIS — K5289 Other specified noninfective gastroenteritis and colitis: Secondary | ICD-10-CM | POA: Insufficient documentation

## 2010-09-15 DIAGNOSIS — K529 Noninfective gastroenteritis and colitis, unspecified: Secondary | ICD-10-CM

## 2010-09-15 DIAGNOSIS — O210 Mild hyperemesis gravidarum: Secondary | ICD-10-CM | POA: Insufficient documentation

## 2010-09-15 DIAGNOSIS — O34219 Maternal care for unspecified type scar from previous cesarean delivery: Secondary | ICD-10-CM

## 2010-09-15 DIAGNOSIS — Z3480 Encounter for supervision of other normal pregnancy, unspecified trimester: Secondary | ICD-10-CM

## 2010-09-15 LAB — DIFFERENTIAL
Basophils Absolute: 0 10*3/uL (ref 0.0–0.1)
Basophils Relative: 0 % (ref 0–1)
Eosinophils Absolute: 0.2 10*3/uL (ref 0.0–0.7)
Monocytes Relative: 5 % (ref 3–12)
Neutro Abs: 8.3 10*3/uL — ABNORMAL HIGH (ref 1.7–7.7)
Neutrophils Relative %: 79 % — ABNORMAL HIGH (ref 43–77)

## 2010-09-15 LAB — URINALYSIS, ROUTINE W REFLEX MICROSCOPIC
Bilirubin Urine: NEGATIVE
Ketones, ur: NEGATIVE mg/dL
Leukocytes, UA: NEGATIVE
Nitrite: NEGATIVE
Urobilinogen, UA: 0.2 mg/dL (ref 0.0–1.0)
pH: 6 (ref 5.0–8.0)

## 2010-09-15 LAB — CBC
Hemoglobin: 10.3 g/dL — ABNORMAL LOW (ref 12.0–15.0)
MCH: 27.2 pg (ref 26.0–34.0)
MCHC: 32.1 g/dL (ref 30.0–36.0)
Platelets: 161 10*3/uL (ref 150–400)
RDW: 15.6 % — ABNORMAL HIGH (ref 11.5–15.5)

## 2010-09-15 MED ORDER — NALBUPHINE SYRINGE 5 MG/0.5 ML
10.0000 mg | INJECTION | INTRAMUSCULAR | Status: DC | PRN
  Administered 2010-09-15: 10 mg via INTRAMUSCULAR
  Filled 2010-09-15: qty 1

## 2010-09-15 MED ORDER — HYDROXYZINE HCL 50 MG/ML IM SOLN
50.0000 mg | Freq: Four times a day (QID) | INTRAMUSCULAR | Status: AC | PRN
  Administered 2010-09-15: 50 mg via INTRAMUSCULAR
  Filled 2010-09-15: qty 1

## 2010-09-15 MED ORDER — ONDANSETRON 8 MG PO TBDP
8.0000 mg | ORAL_TABLET | Freq: Once | ORAL | Status: DC
  Filled 2010-09-15: qty 1

## 2010-09-15 MED ORDER — NALBUPHINE SYRINGE 5 MG/0.5 ML
10.0000 mg | INJECTION | INTRAMUSCULAR | Status: DC | PRN
  Filled 2010-09-15: qty 1

## 2010-09-15 NOTE — ED Provider Notes (Signed)
History   Pt presents today c/o N&V and diarrhea since about 1am. She states she has vomited about 4 times and had 4-5 episodes of diarrhea. She denies fever, vag dc, bleeding, or any other sx at this time. She has noted some abd cramping associated with the diarrhea.  Chief Complaint  Patient presents with  . Emesis   HPI  OB History    Grav Para Term Preterm Abortions TAB SAB Ect Mult Living   3 2 2  0 0 0 0 0 0 1      Past Medical History  Diagnosis Date  . Melanoma 2003    Past Surgical History  Procedure Date  . Cesarean section     Family History  Problem Relation Age of Onset  . Cancer Mother     Ovarian  . Cancer Maternal Grandmother     Ovarian  . Diabetes Maternal Grandfather     History  Substance Use Topics  . Smoking status: Never Smoker   . Smokeless tobacco: Not on file  . Alcohol Use: No    Allergies:  Allergies  Allergen Reactions  . Strattera (Atomoxetine Hcl) Nausea And Vomiting and Other (See Comments)    insomnia    Prescriptions prior to admission  Medication Sig Dispense Refill  . acetaminophen (TYLENOL) 160 MG tablet Take 160 mg by mouth every 6 (six) hours as needed. For fever       . IRON PO Take 1 tablet by mouth daily.        . prenatal vitamin w/FE, FA (PRENATAL 1 + 1) 27-1 MG TABS Take 1 tablet by mouth daily.        . IRON COMBINATIONS PO Take by mouth.        [name] PRENATAL VITAMINS PO Take by mouth.        . ValACYclovir HCl (VALTREX PO) Take by mouth as needed.          Review of Systems  Constitutional: Positive for chills and malaise/fatigue. Negative for fever, weight loss and diaphoresis.  Cardiovascular: Negative for chest pain and palpitations.  Gastrointestinal: Positive for nausea, vomiting, abdominal pain and diarrhea. Negative for constipation and blood in stool.  Genitourinary: Negative for dysuria, urgency, frequency, hematuria and flank pain.  Neurological: Negative for dizziness and headaches.    Psychiatric/Behavioral: Negative for depression and suicidal ideas.   Physical Exam   Blood pressure 114/61, pulse 91, temperature 97 F (36.1 C), temperature source Oral, resp. rate 18, height 5\' 8"  (1.727 m), weight 198 lb (89.812 kg), last menstrual period 05/05/2010.  Physical Exam  Constitutional: She is oriented to person, place, and time. She appears well-developed and well-nourished. No distress.  HENT:  Head: Normocephalic and atraumatic.  Eyes: EOM are normal. Pupils are equal, round, and reactive to light.  Cardiovascular: Normal rate, regular rhythm and normal heart sounds.  Exam reveals no gallop and no friction rub.   No murmur heard. Respiratory: Effort normal and breath sounds normal. No respiratory distress. She has no wheezes. She has no rales. She exhibits no tenderness.  GI: Soft. She exhibits no distension. There is no tenderness. There is no rebound and no guarding.  Neurological: She is alert and oriented to person, place, and time.  Skin: Skin is warm and dry. She is not diaphoretic.  Psychiatric: She has a normal mood and affect. Her behavior is normal. Judgment and thought content normal.    MAU Course  Procedures  Results for orders placed during the hospital  encounter of 09/15/10 (from the past 24 hour(s))  URINALYSIS, ROUTINE W REFLEX MICROSCOPIC     Status: Normal   Collection Time   09/15/10  5:50 AM      Component Value Range   Color, Urine YELLOW  YELLOW    Appearance CLEAR  CLEAR    Specific Gravity, Urine 1.025  1.005 - 1.030    pH 6.0  5.0 - 8.0    Glucose, UA NEGATIVE  NEGATIVE (mg/dL)   Hgb urine dipstick NEGATIVE  NEGATIVE    Bilirubin Urine NEGATIVE  NEGATIVE    Ketones, ur NEGATIVE  NEGATIVE (mg/dL)   Protein, ur NEGATIVE  NEGATIVE (mg/dL)   Urobilinogen, UA 0.2  0.0 - 1.0 (mg/dL)   Nitrite NEGATIVE  NEGATIVE    Leukocytes, UA NEGATIVE  NEGATIVE   CBC     Status: Abnormal   Collection Time   09/15/10  7:16 AM      Component Value  Range   WBC 10.6 (*) 4.0 - 10.5 (K/uL)   RBC 3.78 (*) 3.87 - 5.11 (MIL/uL)   Hemoglobin 10.3 (*) 12.0 - 15.0 (g/dL)   HCT 09.8 (*) 11.9 - 46.0 (%)   MCV 84.9  78.0 - 100.0 (fL)   MCH 27.2  26.0 - 34.0 (pg)   MCHC 32.1  30.0 - 36.0 (g/dL)   RDW 14.7 (*) 82.9 - 15.5 (%)   Platelets 161  150 - 400 (K/uL)  DIFFERENTIAL     Status: Abnormal   Collection Time   09/15/10  7:16 AM      Component Value Range   Neutrophils Relative 79 (*) 43 - 77 (%)   Neutro Abs 8.3 (*) 1.7 - 7.7 (K/uL)   Lymphocytes Relative 14  12 - 46 (%)   Lymphs Abs 1.5  0.7 - 4.0 (K/uL)   Monocytes Relative 5  3 - 12 (%)   Monocytes Absolute 0.5  0.1 - 1.0 (K/uL)   Eosinophils Relative 2  0 - 5 (%)   Eosinophils Absolute 0.2  0.0 - 0.7 (K/uL)   Basophils Relative 0  0 - 1 (%)   Basophils Absolute 0.0  0.0 - 0.1 (K/uL)     Assessment and Plan  Viral gastroenteritis: discussed with pt at length. She has phenergan at home which she will use vaginally. She is also to take Imodium for the diarrhea. Encouraged proper hydration. Discussed diet, activity, risks, and precautions.  Clinton Gallant. Rice III, DrHSc, MPAS, PA-C  09/15/2010, 8:10 AM   Henrietta Hoover, PA 09/15/10 [id number]

## 2010-09-15 NOTE — ED Notes (Signed)
No adverse effects from meds. Feels better. Family member to drive patient home.

## 2010-09-15 NOTE — Progress Notes (Signed)
Pt reports she awoke about 5 hours ago with nausea and vomiting, has been vomiting and having diarrhea since then some lower abd pain. Fever at home 99.8.

## 2010-09-15 NOTE — Progress Notes (Signed)
Pt states she woke up with stomach pain,vomiting and diarrhea

## 2010-09-23 NOTE — ED Provider Notes (Signed)
Agree with above note.  Debra Hernandez 09/23/2010 9:14 AM   

## 2010-09-29 ENCOUNTER — Ambulatory Visit (INDEPENDENT_AMBULATORY_CARE_PROVIDER_SITE_OTHER): Payer: Managed Care, Other (non HMO) | Admitting: Family Medicine

## 2010-09-29 VITALS — BP 98/65 | Wt 206.0 lb

## 2010-09-29 DIAGNOSIS — B9689 Other specified bacterial agents as the cause of diseases classified elsewhere: Secondary | ICD-10-CM

## 2010-09-29 DIAGNOSIS — N390 Urinary tract infection, site not specified: Secondary | ICD-10-CM

## 2010-09-29 DIAGNOSIS — O2390 Unspecified genitourinary tract infection in pregnancy, unspecified trimester: Secondary | ICD-10-CM

## 2010-09-29 DIAGNOSIS — A499 Bacterial infection, unspecified: Secondary | ICD-10-CM

## 2010-09-29 DIAGNOSIS — N760 Acute vaginitis: Secondary | ICD-10-CM

## 2010-09-29 DIAGNOSIS — Z3480 Encounter for supervision of other normal pregnancy, unspecified trimester: Secondary | ICD-10-CM

## 2010-09-29 DIAGNOSIS — O2340 Unspecified infection of urinary tract in pregnancy, unspecified trimester: Secondary | ICD-10-CM

## 2010-09-29 LAB — POCT URINALYSIS DIPSTICK
Glucose, UA: NEGATIVE
Nitrite, UA: NEGATIVE

## 2010-09-29 MED ORDER — METRONIDAZOLE 500 MG PO TABS: 500.0000 mg | ORAL_TABLET | Freq: Two times a day (BID) | ORAL | Status: DC

## 2010-09-29 NOTE — Patient Instructions (Signed)
Birth Control Choices Birth control is the use of any practices, methods, or devices to prevent pregnancy from happening in a sexually active woman.  Below are some birth control choices to help avoid pregnancy.  Not having sex (abstinence) is the surest form of birth control. This requires self-control. There is no risk of acquiring a sexually transmitted disease (STD), including acquired immunodeficiency syndrome (AIDS).   Periodic abstinence requires self-control during certain times of the month.   Calendar method, timing your menstrual periods from month to month.   Ovulation method is avoiding sexual intercourse around the time you produce an egg (ovulate).   Symptotherm method is avoiding sexual intercourse at the time of ovulation, using a thermometer and ovulation symptoms.   Post ovulation method is the timing of sexual intercourse after you ovulated.  These methods do not protect against STDs, including AIDS.  Birth control pills (BCPs) contain estrogen and progesterone hormone. These medicines work by stopping the egg from forming in the ovary (ovulation). Birth control pills are prescribed by a caregiver who will ask you questions about the risks of taking BCPs. Birth control pills do not protect against STDs, including AIDS.   "Minipill" birth control pills have only the progesterone hormone. They are taken every day of each month and must be prescribed by your caregiver. They do not protect against STDs, including AIDS.   Emergency contraception is often call the "morning after" pill. This pill can be taken right after sex or up to five days after sex if you think your birth control failed, you failed to use contraception, or you were forced to have sex. It is most effective the sooner you take the pills after having sexual intercourse. Do not use emergency contraception as your only form of birth control. Emergency contraceptive pills are available without a prescription. Check  with your pharmacist.   Condoms are a thin sheath of latex, synthetic material, or lambskin worn over the penis during sexual intercourse. They can have a spermicide in or on them when you buy them. Latex condoms can prevent pregnancy and STDs. "Natural" or lambskin condoms can prevent pregnancy but may not protect against STDs, including AIDS.   Female condoms are a soft, loose-fitting sheath that is put into the vagina before sexual intercourse. They can prevent pregnancy and STDs, including AIDS.   Sponge is a soft, circular piece of polyurethane foam with spermicide in it that is inserted into the vagina after wetting it and before sexual intercourse. It does not require a prescription from your caregiver. It does not protect against STDs, including AIDS.   Diaphragm is a soft, latex, dome-shaped barrier that must be fitted by a caregiver. It is inserted into the vagina, along with a spermicidal jelly. After the proper fitting for a diaphragm, always insert the diaphragm before intercourse. The diaphragm should be left in the vagina for 6 to 8 hours after intercourse. Removal and reinsertion with a spermicide is always necessary after any use. It does not protect against STDs, including AIDS.   Progesterone-only injections are given every 3 months to prevent pregnancy. These injections contain synthetic progesterone and no estrogen. This hormone stops the ovaries from releasing eggs. It also causes the cervical mucus to thicken and changes the uterine lining. This makes it harder for sperm to survive in the uterus. It does not protect against STDs, including AIDS.   Birth Control Patch contains hormones similar to those in birth control pills, so effectiveness, risks, and side effects   are similar. It must be changed once a week and is prescribed by a caregiver. It is less effective in very overweight women. It does not protect against STDs, including AIDS.   Vaginal Ring contains hormones similar  to those in birth control pills. It is left in place for 3 weeks, removed for 1 week, and then a new one is put back into the vagina. It comes with a timer to put in your purse to help you remember when to take it out or put a new one in. A caregiver's examination and prescription is necessary, just like with birth control pills and the patch. It does not protect against STDs, including AIDS.   Estrogen plus progesterone injections are given every 28 to 30 days. They can be given in the upper arm, thigh, or buttocks. It does not protect against STDs, including AIDS.   Intrauterine device (IUD): copper T or progestin filled is a T-shaped device that is put in a woman's uterus during a menstrual period to prevent pregnancy. The copper T IUD can last 10 years, and the progestin IUD can last 5 years. The progestin IUD can also help control heavy menstrual periods. It does not protect against STDs, including AIDS. The copper T IUD can be used as emergency contraception if inserted within 5 days of having unprotected intercourse.   Cervical cap is a round, soft latex or plastic cup that fits over the cervix and must be fitted by a caregiver. You do not need to use a spermicide with it or remove and insert it every time you have sexual intercourse. It does not protect against STDs, including AIDS.   Spermicides are chemicals that kill or block sperm from entering the cervix and uterus. They come in the form of creams, jellies, suppositories, foam, or tablets, and they do not require a prescription. They are inserted into the vagina with an applicator before having sexual intercourse. This must be repeated every time you have sexual intercourse.   Withdrawal is using the method of the female withdrawing his penis from sexual intercourse before he has a climax and deposits his sperm. It does not protect against STDs, including AIDS.   Female tubal ligation is when the woman's fallopian tubes are surgically sealed  or tied to prevent the egg from traveling to the uterus. It does not protect against STDs, including AIDS.   Female sterilization is when the female has his tubes that carry sperm tied off (vasectomy) to stop sperm from entering the vagina during sexual intercourse. It does not protect against STDs, including AIDS.  Regardless of which method of birth control you choose, it is still important that you use some form of protection against STDs. Document Released: 12/21/2004 Document Re-Released: 06/10/2009 [facility] Patient Information 2011 [place], [place]. Breastfeeding Your Baby BENEFITS OF BREASTFEEDING For the baby  The first milk (colostrum) helps the baby's digestive system function better.   There are antibodies from the mother in the milk that help the baby fight off infections.   The baby has a lower incidence of asthma, allergies and SIDS (sudden infant death syndrome).   The nutrients in breast milk are better than formulas for the baby and helps the baby's brain grow better.   Babies who breastfeed have less gas, colic and constipation.  For the mother  Breastfeeding helps develop a very special bond between mother and baby.   It is more convenient, always available at the correct temperature and cheaper than formula feeding.  It burns calories in the mother and helps with losing weight that was gained during pregnancy.   It makes the uterus contract back down to normal size faster and slows bleeding following delivery.   Breastfeeding mothers have a lower risk of developing breast cancer.  NURSE FREQUENTLY  A healthy, full-term baby may breastfeed as often as every hour or space his feedings to every three hours.   How often to nurse will vary from baby to baby. Watch your baby for signs of hunger, not the clock.   Nurse as often as the baby requests, or when you feel the need to reduce the fullness of your breasts.   Awaken the baby if it has been 3-4 hours since the  last feeding.   Frequent feeding will help the mother make more milk and will prevent problems like sore nipples and engorgement of the breasts.  BABY'S POSITION AT THE BREAST  Whether lying down or sitting, be sure that the baby's tummy is facing your tummy.   Support the breast with four fingers underneath the breast and the thumb above. Make sure your fingers are well away from the nipple and baby's mouth.   Stroke the baby's lips and cheek closest to the breast gently with your finger or nipple.   When the baby's mouth is open wide enough, place all of your nipple and as much of the dark area around the nipple as possible into your baby's mouth.   Pull the baby in close so the tip of the nose and the baby's cheeks touch the breast during the feeding.  FEEDINGS  The length of each feeding varies from baby to baby and from feeding to feeding.   The baby must suck about two to three minutes for your milk to get to the him. This is called a "let down." For this reason, allow the baby to feed on each breast as long as he wants. He will end the feeding when he has received the right balance of nutrients.   To break the suction, put your finger into the corner of the baby's mouth and slide it between his gums before removing your breast from his mouth. This will help prevent sore nipples.  REDUCING BREAST ENGORGEMENT  In the first week after your baby is born, you may experience signs of breast engorgement. When breasts are engorged, they feel heavy, warm, full and may be tender to the touch. You can reduce engorgement if you:   Nurse frequently, every 2-3 hours. Mothers who breastfeed early and often have fewer problems with engorgement.   Place light ice packs (bags of frozen corn or peas work well) on your breasts between feedings. This reduces swelling. Wrap the ice packs in a lightweight towel to protect your skin.   Apply moist hot packs to your breast for 5-10 minutes before each  feeding. This increases circulation and helps the milk flow.   Gently massage your breast before and during the feeding.   Make sure that the baby empties at least one breast at every feeding before switching sides.   Use a breast pump to empty the breasts if your baby is sleepy or not nursing well. You may also want to pump if you are returning to work or or you feel you are getting engorged.   Avoid bottle feeds, pacifiers or supplemental feedings of water or juice in place of breastfeeding.   Be sure the baby is latched on and positioned properly while breastfeeding.  Prevent fatigue, stress and anemia.   Wear a supportive bra, avoiding underwire styles.   Eat a balanced diet with enough fluids.  If you follow these suggestions, your engorgement should improve in 24-48 hours. If you are still experiencing difficulty, call your lactation consultant or caregiver. IS MY BABY GETTING ENOUGH MILK? Sometimes, mothers worry about whether their babies are getting enough milk. You can be assured that your baby is getting enough milk if:  The baby is actively sucking and you hear swallowing.   The baby nurses at least 8-12 times in a 24 hour time period. Nurse him until he unlatches himself or falls asleep at the first breast (at least 10-20 minutes), then offer the second side.   The baby is wetting 5-6 disposable diapers (6-8 cloth diapers) in a 24 hour period by 27-34 days of age.   The baby is having at least 2-3 stools every 24 hours for the first few months. Breast milk is all the food your baby needs. It is not necessary for your baby to have water or formula. In fact, to help your breasts make more milk, it is best not to give your baby supplemental feedings during the early weeks.   The stool should be soft and yellow.   The baby should gain 4-6 ounces per week after he is 12 days old.  TAKE CARE OF YOURSELF Take care of your breasts by:  Bathing or showering daily.   Avoiding  the use of soaps on your nipples.   Start feedings on your left breast at one feeding and on your right breast at the next feeding.   You will notice an increase in your milk supply 2-5 days after delivery. You may feel some discomfort from engorgement, which makes your breasts very firm and often tender. Engorgement "peaks" out within 24 to 48 hours. In the meantime, apply warm moist towels to your breasts for 5-10 minutes before feeding. Gentle massage and expression of some milk before feeding will soften your breasts, making it easier for your baby to latch on. Wear a well fitting nursing bra and air dry your nipples for 10-15 minutes after each feeding.   Only use cotton bra pads.   Only use pure lanolin on your nipples after nursing. You do not need to wash it off before nursing.  Take care of yourself by:   Eating well-balanced meals and nutritious snacks.   Drinking milk, fruit juice and water to satisfy your thirst (about 8 glasses/day).   Getting plenty of rest.   Increase calcium in your diet (1200mg /day).   Avoid foods that you notice affect the baby in a bad way.  SEEK MEDICAL CARE IF:  You have any questions or difficulty with breastfeeding.   You need help.   You have a hard, red, sore area on your breast, accompanied by a fever of 100.5 F (38.1 C) or more.   Your baby is too sleepy to eat well or is having trouble sleeping.   Your baby is wetting less than 6 diapers per day, by 88 days of age.   Your baby's skin or white part of his/her eyes is more yellow than it was in the hospital.   You feel depressed.  Document Released: 12/21/2004 Document Re-Released: 10/17/2008 [facility] Patient Information 2011 [place], [place].

## 2010-09-29 NOTE — Progress Notes (Signed)
  Subjective:    Debra Hernandez is a 25 y.o. G3P2001 [redacted]w[redacted]d being seen today for her obstetrical visit.  Patient reports vaginal irritation. Fetal movement: normal.  Objective:    BP 98/65  Wt 206 lb (93.441 kg)  LMP 05/05/2010  Physical Exam  Exam  FHT:  150 BPM  Uterine Size: 22 cm  Presentation: unsure     Assessment:    Pregnancy:  G3P2001    Plan:    Patient Active Problem List  Diagnoses  . Supervision of other normal pregnancy  . Previous cesarean delivery, antepartum condition or complication  . Fetal hydrops    Continue PNC- Fetal ECHO Follow up in 2 Weeks.   Flagyl

## 2010-09-30 LAB — WET PREP, GENITAL
Trich, Wet Prep: NONE SEEN
Yeast Wet Prep HPF POC: NONE SEEN

## 2010-09-30 MED ORDER — METRONIDAZOLE 500 MG PO TABS: 500.0000 mg | ORAL_TABLET | Freq: Two times a day (BID) | ORAL | Status: AC

## 2010-09-30 NOTE — Progress Notes (Signed)
Addended by: Barbara Cower on: 09/30/2010 02:08 PM   Modules accepted: Orders

## 2010-10-05 ENCOUNTER — Ambulatory Visit (HOSPITAL_COMMUNITY): Payer: Commercial Indemnity

## 2010-10-12 ENCOUNTER — Ambulatory Visit (HOSPITAL_COMMUNITY)
Admission: RE | Admit: 2010-10-12 | Discharge: 2010-10-12 | Disposition: A | Discharge status: Home / Self Care | Payer: Managed Care, Other (non HMO) | Source: Ambulatory Visit | Attending: Family Medicine | Admitting: Family Medicine

## 2010-10-12 DIAGNOSIS — O2690 Pregnancy related conditions, unspecified, unspecified trimester: Secondary | ICD-10-CM

## 2010-10-12 DIAGNOSIS — O34219 Maternal care for unspecified type scar from previous cesarean delivery: Secondary | ICD-10-CM | POA: Insufficient documentation

## 2010-10-12 DIAGNOSIS — O352XX0 Maternal care for (suspected) hereditary disease in fetus, not applicable or unspecified: Secondary | ICD-10-CM | POA: Insufficient documentation

## 2010-10-12 DIAGNOSIS — O09299 Supervision of pregnancy with other poor reproductive or obstetric history, unspecified trimester: Secondary | ICD-10-CM | POA: Insufficient documentation

## 2010-10-27 ENCOUNTER — Encounter: Payer: Self-pay | Admitting: Family Medicine

## 2010-10-27 ENCOUNTER — Ambulatory Visit (INDEPENDENT_AMBULATORY_CARE_PROVIDER_SITE_OTHER): Payer: Managed Care, Other (non HMO) | Admitting: Family Medicine

## 2010-10-27 VITALS — BP 105/68 | Wt 211.0 lb

## 2010-10-27 DIAGNOSIS — Z3480 Encounter for supervision of other normal pregnancy, unspecified trimester: Secondary | ICD-10-CM

## 2010-10-27 DIAGNOSIS — M545 Low back pain, unspecified: Secondary | ICD-10-CM

## 2010-10-27 NOTE — Patient Instructions (Signed)
Back Pain in Pregnancy  Back pain during pregnancy is common. It happens in about half of all pregnancies. It is important for you and your baby that you remain active during your pregnancy.If you feel that back pain is not allowing you to remain active or sleep well, it is time to see your caregiver. Back pain may be caused by several factors related to changes during your pregnancy.Fortunately, unless you had trouble with your back before your pregnancy, the pain is likely to get better after you deliver.  Low back pain usually occurs between the fifth and seventh months of pregnancy. It can, however, happen in the first couple months. Factors that increase the risk of back problems include:    Previous back problems.   Injury to your back.   Having twins or multiple births.   A chronic cough.   Stress.   Job-related repetitive motions.   Muscle or spinal disease in the back.   Family history of back problems, ruptured (herniated) discs, or osteoporosis.   Depression, anxiety, and panic attacks.  CAUSES    When you are pregnant, your body produces a hormone called relaxin. This hormonemakes the ligaments connecting the low back and pubic bones more flexible. This flexibility allows the baby to be delivered more easily. When your ligaments are loose, your muscles need to work harder to support your back. Soreness in your back can come from tired muscles. Soreness can also come from back tissues that are irritated since they are receiving less support.   As the baby grows, it puts pressure on the nerves and blood vessels in your pelvis. This can cause back pain.   As the baby grows and gets heavier during pregnancy, the uterus pushes the stomach muscles forward and changes your center of gravity. This makes your back muscles work harder to maintain good posture.  SYMPTOMS   Lumbar pain during pregnancy  Lumbar pain during pregnancy usually occurs at or above the waist in the center of the back. There  may be pain and numbness that radiates into your leg or foot. This is similar to low back pain experienced by non-pregnant women. It usually increases with sitting for long periods of time, standing, or repetitive lifting. Tenderness may also be present in the muscles along your upper back.  Posterior pelvic pain during pregnancy  Pain in the back of the pelvis is more common than lumbar pain in pregnancy. It is a deep pain felt in your side at the waistline, or across the tailbone (sacrum), or in both places. You may have pain on one or both sides. This pain can also go into the buttocks and backs of the upper thighs. Pubic and groin pain may also be present. The pain does not quickly resolve with rest, and morning stiffness may also be present.  Pelvic pain during pregnancy can be brought on by most activities. A high level of fitness before and during pregnancy may or may not prevent this problem. Labor pain is usually 1 to 2 minutes apart, lasts for about 1 minute, and involves a bearing down feeling or pressure in your pelvis. However, if you are at term with the pregnancy, constant low back pain can be the beginning of early labor, and you should be aware of this.  DIAGNOSIS   X-rays of the back should not be done during the first 12 to 14 weeks of the pregnancy and only when absolutely necessary during the rest of the pregnancy. MRIs do   not give off radiation and are safe during pregnancy. MRIs also should only be done when absolutely necessary.  HOME CARE INSTRUCTIONS   Exercise as directed by your caregiver. Exercise is the most effective way to prevent or manage back pain. If you have a back problem, it is especially important to avoid sports that require sudden body movements. Swimming and walking are great activities.   Do not stand in one place for long periods of time.   Do not wear high heels.   Sit in chairs with good posture. Use a pillow on your lower back if necessary. Make sure your head  rests over your shoulders and is not hanging forward.   Try sleeping on your side, preferably the left side, with a pillow or two between your legs. If you are sore after a night's rest, your bedmay betoo soft.Try placing a board between your mattress and box spring.   Listen to your body when lifting.If you are experiencing pain, ask for help or try bending yourknees more so you can use your leg muscles rather than your back muscles. Squat down when picking up something from the floor. Do not bend over.   Eat a healthy diet. Try to gain weight within your caregiver's recommendations.   Use heat or cold packs 3 to 4 times a day for 15 minutes to help with the pain.   Only take over-the-counter or prescription medicines for pain, discomfort, or fever as directed by your caregiver.  Sudden (acute) back pain   Use bed rest for only the most extreme, acute episodes of back pain. Prolonged bed rest over 48 hours will aggravate your condition.   Ice is very effective for acute conditions.   Put ice in a plastic bag.   Place a towel between your skin and the bag.   Leave the ice on for 10 to 20 minutes every 2 hours, or as needed.   Using heat packs for 30 minutes prior to activities is also helpful.  Continued back pain  See your caregiver if you have continued problems. Your caregiver can help or refer you for appropriate physical therapy. With conditioning, most back problems can be avoided. Sometimes, a more serious issue may be the cause of back pain. You should be seen right away if new problems seem to be developing. Your caregiver may recommend:   A maternity girdle.   An elastic sling.   A back brace.   A massage therapist or acupuncture.  SEEK MEDICAL CARE IF:    You are not able to do most of your daily activities, even when taking the pain medicine you were given.   You need a referral to a physical therapist or chiropractor.   You want to try acupuncture.  SEEK IMMEDIATE MEDICAL CARE  IF:   You develop numbness, tingling, weakness, or problems with the use of your arms or legs.   You develop severe back pain that is no longer relieved with medicines.   You have a sudden change in bowel or bladder control.   You have increasing pain in other areas of the body.   You develop shortness of breath, dizziness, or fainting.   You develop nausea, vomiting, or sweating.   You have back pain which is similar to labor pains.   You have back pain along with your water breaking or vaginal bleeding.   You have back pain or numbness that travels down your leg.   Your back pain developed after   kidney stone.   You see blood in your urine. You may have a bladder infection or kidney stone.   You have back pain with blisters. You may have shingles.  Back pain is fairly common during pregnancy but should not be accepted as just part of the process. Back pain should always be treated as soon as possible. This will make your pregnancy as pleasant as possible. Document Released: 03/31/2005 Document Revised: 09/02/2010 Document Reviewed: 05/12/2010 [facility] Patient Information 2012 [place], [place].Preterm Labor Preterm labor is when labor starts at less than 37 weeks of pregnancy. The normal length of a pregnancy is 39 to 41 weeks. CAUSES Often, there is no identifiable underlying cause as to why a woman goes into preterm labor. However, one of the most common known causes of preterm labor is infection. Infections of the uterus, cervix, vagina, amniotic sac, bladder, kidney, or even the lungs (pneumonia) can cause labor to start. Other causes of preterm labor include:  Urogenital infections, such as yeast infections and bacterial vaginosis.   Uterine abnormalities (uterine shape, uterine septum, fibroids, bleeding from the placenta).   A cervix that has been operated on and opens  prematurely.   Malformations in the baby.   Multiple gestations (twins, triplets, and so on).   Breakage of the amniotic sac.  Additional risk factors for preterm labor include:  Previous history of preterm labor.   Premature rupture of membranes (PROM).   A placenta that covers the opening of the cervix (placenta previa).   A placenta that separates from the uterus (placenta abruption).   A cervix that is too weak to hold the baby in the uterus (incompetence cervix).   Having too much fluid in the amniotic sac (polyhydramnios).   Taking illegal drugs or smoking while pregnant.   Not gaining enough weight while pregnant.   Women younger than 73 and older than 24 years old.   Low socioeconomic status.   African-American ethnicity.  SYMPTOMS Signs and symptoms of preterm labor include:  Menstrual-like cramps.   Contractions that are 30 to 70 seconds apart, become very regular, closer together, and are more intense and painful.   Contractions that start on the top of the uterus and spread down to the lower abdomen and back.   A sense of increased pelvic pressure or back pain.   A watery or bloody discharge that comes from the vagina.  DIAGNOSIS  A diagnosis can be confirmed by:  A vaginal exam.   An ultrasound of the cervix.   Sampling (swabbing) cervico-vaginal secretions. These samples can be tested for the presence of fetal fibronectin. This is a protein found in cervical discharge which is associated with preterm labor.   Fetal monitoring.  TREATMENT  Depending on the length of the pregnancy and other circumstances, a caregiver may suggest bed rest. If necessary, there are medicines that can be given to stop contractions and to quicken fetal lung maturity. If labor happens before 34 weeks of pregnancy, a prolonged hospital stay may be recommended. Treatment depends on the condition of both the mother and baby. PREVENTION There are some things a mother can do to  lower the risk of preterm labor in future pregnancies. A woman can:   Stop smoking.   Maintain healthy weight gain and avoid chemicals and drugs that are not necessary.   Be watchful for any type of infection.   Inform her caregiver if she has a known history of preterm labor.  Document Released: 03/13/2003 Document Revised: 09/02/2010  Document Reviewed: 04/17/2010 [facility] Patient Information 2012 [place], [place].

## 2010-10-27 NOTE — Progress Notes (Signed)
Starting at 5 am today, increasing back pain and low pelvic cramping that goes all the way around "almost like a ring"  She has had significantly increased watery discharge that is making her underwear stay wet.  Increased nausea and lack of appetite.  She just feels bad today and it has been getting worse since she woke.  The baby's movement is not as much as usual.

## 2010-10-27 NOTE — Progress Notes (Signed)
Watery d/c and low abd pain and back pain. SSE neg. Pool, neg nitrazine  Subjective:    Debra Hernandez is a 25 y.o. G3P2001 [redacted]w[redacted]d being seen today for her obstetrical visit.  Patient reports backache. Fetal movement: normal.  Objective:    BP 105/68  Wt 211 lb (95.709 kg)  LMP 05/05/2010  Physical Exam  Exam  FHT:  154 BPM  Uterine Size: 26 cm  Presentation: unsure   External os is open and internal os is closed.  Assessment:    Pregnancy:  G3P2001    Plan:    Patient Active Problem List  Diagnoses  . Supervision of other normal pregnancy  . Previous cesarean delivery, antepartum condition or complication  . Fetal hydrops    Ruled out ROM, check PTL, f/u in 1 day for recheck. Follow up in 1 day.

## 2010-10-28 ENCOUNTER — Ambulatory Visit (INDEPENDENT_AMBULATORY_CARE_PROVIDER_SITE_OTHER): Payer: Managed Care, Other (non HMO) | Admitting: Family Medicine

## 2010-10-28 VITALS — BP 118/76 | Wt 209.0 lb

## 2010-10-28 DIAGNOSIS — Z3480 Encounter for supervision of other normal pregnancy, unspecified trimester: Secondary | ICD-10-CM

## 2010-10-28 MED ORDER — CYCLOBENZAPRINE HCL 5 MG PO TABS: 10.0000 mg | ORAL_TABLET | Freq: Three times a day (TID) | ORAL | Status: DC | PRN

## 2010-10-28 MED ORDER — INDOMETHACIN 50 MG PO CAPS: 50.0000 mg | ORAL_CAPSULE | Freq: Four times a day (QID) | ORAL | Status: DC

## 2010-10-28 NOTE — Progress Notes (Signed)
Lower pelvic pain is somewhat better, more achey now likened it to the same pain as after a charlie horse.  Her lower back is still hurting extremely bad.

## 2010-10-28 NOTE — Progress Notes (Signed)
Continued back pain, improved abd. Pain.  Cervix is unchanged.  Back is tender over SI joints.  Trial of heat, exercise and muscle relaxant.  2 days of Indocin.  Desires panniculectomy with C-section.  28 wk labs today.

## 2010-10-28 NOTE — Progress Notes (Signed)
Addended by: Vinnie Langton C on: 10/28/2010 03:50 PM   Modules accepted: Orders

## 2010-10-28 NOTE — Patient Instructions (Signed)
Continue to use heat!  Stretch out back as much as possible.  Return to normal activity slowly.  Back Exercises Back exercises help treat and prevent back injuries. The goal of back exercises is to increase the strength of your abdominal and back muscles and the flexibility of your back. These exercises should be started when you no longer have back pain. Back exercises include:  Pelvic Tilt. Lie on your back with your knees bent. Tilt your pelvis until the lower part of your back is against the floor. Hold this position 5 to 10 sec and repeat 5 to 10 times.   Knee to Chest. Pull first 1 knee up against your chest and hold for 20 to 30 seconds, repeat this with the other knee, and then both knees. This may be done with the other leg straight or bent, whichever feels better.   Sit-Ups or Curl-Ups. Bend your knees 90 degrees. Start with tilting your pelvis, and do a partial, slow sit-up, lifting your trunk only 30 to 45 degrees off the floor. Take at least 2 to 3 seconds for each sit-up. Do not do sit-ups with your knees out straight. If partial sit-ups are difficult, simply do the above but with only tightening your abdominal muscles and holding it as directed.   Hip-Lift. Lie on your back with your knees flexed 90 degrees. Push down with your feet and shoulders as you raise your hips a couple inches off the floor; hold for 10 seconds, repeat 5 to 10 times.   Back arches. Lie on your stomach, propping yourself up on bent elbows. Slowly press on your hands, causing an arch in your low back. Repeat 3 to 5 times. Any initial stiffness and discomfort should lessen with repetition over time.   Shoulder-Lifts. Lie face down with arms beside your body. Keep hips and torso pressed to floor as you slowly lift your head and shoulders off the floor.  Do not overdo your exercises, especially in the beginning. Exercises may cause you some mild back discomfort which lasts for a few minutes; however, if the pain is  more severe, or lasts for more than 15 minutes, do not continue exercises until you see your caregiver. Improvement with exercise therapy for back problems is slow.  See your caregivers for assistance with developing a proper back exercise program. Document Released: 01/29/2004 Document Revised: 08/19/2010 Document Reviewed: 12/21/2004 [facility] Patient Information 2012 [place], [place].

## 2010-10-29 ENCOUNTER — Encounter: Payer: Commercial Indemnity | Admitting: Obstetrics & Gynecology

## 2010-11-04 ENCOUNTER — Encounter: Payer: Managed Care, Other (non HMO) | Admitting: Family Medicine

## 2010-11-11 ENCOUNTER — Ambulatory Visit (INDEPENDENT_AMBULATORY_CARE_PROVIDER_SITE_OTHER): Payer: Managed Care, Other (non HMO) | Admitting: Obstetrics & Gynecology

## 2010-11-11 DIAGNOSIS — Z3480 Encounter for supervision of other normal pregnancy, unspecified trimester: Secondary | ICD-10-CM

## 2010-11-11 NOTE — Progress Notes (Signed)
Some "shocking" pain in vagina, cervix, some BHC Cervix is stable

## 2010-11-11 NOTE — Progress Notes (Signed)
Patient is having sharper contractions over the past two weeks.  They are not regular but will wake her from her sleep.  She has also had an increase in the watery discharge along with brown to yellow mucous discharge.  Urine dip is negative for protein, sugar, leukocytes, nitrates and blood.

## 2010-11-18 ENCOUNTER — Telehealth: Payer: Self-pay | Admitting: *Deleted

## 2010-11-18 DIAGNOSIS — B009 Herpesviral infection, unspecified: Secondary | ICD-10-CM

## 2010-11-18 MED ORDER — VALACYCLOVIR HCL 1 G PO TABS: 1000.0000 mg | ORAL_TABLET | Freq: Two times a day (BID) | ORAL | Status: AC

## 2010-11-18 NOTE — Telephone Encounter (Signed)
Patient is requesting a refill of her Valtrex. 

## 2010-11-23 ENCOUNTER — Ambulatory Visit (HOSPITAL_COMMUNITY): Payer: Managed Care, Other (non HMO)

## 2010-11-24 ENCOUNTER — Encounter: Payer: Managed Care, Other (non HMO) | Admitting: Family Medicine

## 2010-11-24 ENCOUNTER — Ambulatory Visit (HOSPITAL_COMMUNITY)
Admission: RE | Admit: 2010-11-24 | Discharge: 2010-11-24 | Disposition: A | Discharge status: Home / Self Care | Payer: Managed Care, Other (non HMO) | Source: Ambulatory Visit | Attending: Family Medicine | Admitting: Family Medicine

## 2010-11-24 DIAGNOSIS — O352XX0 Maternal care for (suspected) hereditary disease in fetus, not applicable or unspecified: Secondary | ICD-10-CM | POA: Insufficient documentation

## 2010-11-24 DIAGNOSIS — O09299 Supervision of pregnancy with other poor reproductive or obstetric history, unspecified trimester: Secondary | ICD-10-CM | POA: Insufficient documentation

## 2010-11-24 DIAGNOSIS — O2690 Pregnancy related conditions, unspecified, unspecified trimester: Secondary | ICD-10-CM

## 2010-11-24 DIAGNOSIS — O34219 Maternal care for unspecified type scar from previous cesarean delivery: Secondary | ICD-10-CM | POA: Insufficient documentation

## 2010-11-25 ENCOUNTER — Observation Stay: Payer: Self-pay

## 2010-12-01 ENCOUNTER — Ambulatory Visit (HOSPITAL_COMMUNITY): Payer: Managed Care, Other (non HMO)

## 2010-12-01 ENCOUNTER — Encounter: Payer: Managed Care, Other (non HMO) | Admitting: Obstetrics and Gynecology

## 2010-12-08 ENCOUNTER — Ambulatory Visit (INDEPENDENT_AMBULATORY_CARE_PROVIDER_SITE_OTHER): Payer: Managed Care, Other (non HMO) | Admitting: Family Medicine

## 2010-12-08 VITALS — BP 133/65 | Wt 212.0 lb

## 2010-12-08 DIAGNOSIS — O26899 Other specified pregnancy related conditions, unspecified trimester: Secondary | ICD-10-CM

## 2010-12-08 DIAGNOSIS — O26819 Pregnancy related exhaustion and fatigue, unspecified trimester: Secondary | ICD-10-CM

## 2010-12-08 DIAGNOSIS — R5381 Other malaise: Secondary | ICD-10-CM

## 2010-12-08 DIAGNOSIS — Z3480 Encounter for supervision of other normal pregnancy, unspecified trimester: Secondary | ICD-10-CM

## 2010-12-08 NOTE — Progress Notes (Signed)
Addended by: Barbara Cower on: 12/08/2010 02:14 PM   Modules accepted: Orders

## 2010-12-08 NOTE — Progress Notes (Signed)
Increased pressure and mucous discharge. Irreg ctx, < 4/hour. Cvx: FT/thick/vtx/-1. Comfort measures reviewed.

## 2010-12-08 NOTE — Patient Instructions (Signed)

## 2010-12-08 NOTE — Progress Notes (Signed)
Patient is having significant increase in pressure and discharge has changed from watery to more mucous and stringy...Debra KitchenMarland KitchenShe also has been to ER since last visit due to stomach flu.  She has lost some weight due to this and was told her blood sugar was 63 and she was tachycardic.  She is feeling better from that now but seems to be very pale in color and just feels bad.  She would like blood work today to check her Hgm.

## 2010-12-10 ENCOUNTER — Inpatient Hospital Stay (HOSPITAL_COMMUNITY)
Admission: AD | Admit: 2010-12-10 | Discharge: 2010-12-10 | Disposition: A | Discharge status: Home / Self Care | Payer: Managed Care, Other (non HMO) | Source: Ambulatory Visit | Attending: Family Medicine | Admitting: Family Medicine

## 2010-12-10 ENCOUNTER — Encounter (HOSPITAL_COMMUNITY): Payer: Self-pay | Admitting: *Deleted

## 2010-12-10 DIAGNOSIS — O34219 Maternal care for unspecified type scar from previous cesarean delivery: Secondary | ICD-10-CM

## 2010-12-10 DIAGNOSIS — O4700 False labor before 37 completed weeks of gestation, unspecified trimester: Secondary | ICD-10-CM | POA: Insufficient documentation

## 2010-12-10 DIAGNOSIS — Z3480 Encounter for supervision of other normal pregnancy, unspecified trimester: Secondary | ICD-10-CM

## 2010-12-10 DIAGNOSIS — R109 Unspecified abdominal pain: Secondary | ICD-10-CM | POA: Insufficient documentation

## 2010-12-10 HISTORY — DX: Calculus of kidney: N20.0

## 2010-12-10 HISTORY — DX: Polycystic ovarian syndrome: E28.2

## 2010-12-10 HISTORY — DX: Anemia, unspecified: D64.9

## 2010-12-10 LAB — URINALYSIS, ROUTINE W REFLEX MICROSCOPIC
Leukocytes, UA: NEGATIVE
Nitrite: NEGATIVE
Specific Gravity, Urine: 1.025 (ref 1.005–1.030)
Urobilinogen, UA: 0.2 mg/dL (ref 0.0–1.0)
pH: 6 (ref 5.0–8.0)

## 2010-12-10 LAB — WET PREP, GENITAL
Clue Cells Wet Prep HPF POC: NONE SEEN
Trich, Wet Prep: NONE SEEN

## 2010-12-10 MED ORDER — LACTATED RINGERS IV BOLUS (SEPSIS)
1000.0000 mL | Freq: Once | INTRAVENOUS | Status: AC
  Administered 2010-12-10: 1000 mL via INTRAVENOUS

## 2010-12-10 MED ORDER — BETAMETHASONE SOD PHOS & ACET 6 (3-3) MG/ML IJ SUSP
12.0000 mg | Freq: Once | INTRAMUSCULAR | Status: AC
  Administered 2010-12-10: 12 mg via INTRAMUSCULAR
  Filled 2010-12-10: qty 2

## 2010-12-10 MED ORDER — NIFEDIPINE 10 MG PO CAPS
10.0000 mg | ORAL_CAPSULE | Freq: Once | ORAL | Status: AC
  Administered 2010-12-10: 10 mg via ORAL
  Filled 2010-12-10: qty 1

## 2010-12-10 MED ORDER — NIFEDIPINE 10 MG PO CAPS: 10.0000 mg | ORAL_CAPSULE | Freq: Four times a day (QID) | ORAL | Status: DC | PRN

## 2010-12-10 NOTE — ED Provider Notes (Signed)
Chart reviewed and agree with management and plan.  

## 2010-12-10 NOTE — ED Provider Notes (Signed)
Debra Hernandez is a 25 y.o. year old G50P2001 female at [redacted]w[redacted]d weeks gestation who presents to [facility]U reporting cramping wrapping around her low abd and low back that has worsened today and continued feeling of dampness in her underwear.  History OB History    Grav Para Term Preterm Abortions TAB SAB Ect Mult Living   3 2 2  0 0 0 0 0 0 1     Past Medical History  Diagnosis Date  . Melanoma 2003  . Anemia   . Polycystic ovary syndrome   . Kidney stone    Past Surgical History  Procedure Date  . Cesarean section   . Laporascopy   . Tonsillectomy    Family History: family history includes Cancer in her maternal grandmother and mother and Diabetes in her maternal grandfather. Social History:  reports that she has never smoked. She does not have any smokeless tobacco history on file. She reports that she does not drink alcohol or use illicit drugs.  ROS  Dilation: Fingertip: 1 ext os, closed internally Effacement (%): Thick Station: -3 Exam by:: VKatrinka Blazing CNM Blood pressure 121/59, pulse 101, temperature 98.4 F (36.9 C), temperature source Oral, resp. rate 20, height 5\' 8"  (1.727 m), weight 97.977 kg (216 lb), last menstrual period 05/05/2010, SpO2 99.00%. Exam General: Mild discomfort, A&O x 4 Abd: NT, gravid, S=D, mild UC's palpated Pelvic: Mod clear mucus. Neg for pooling or VB  FHR: category I Toco: frequent UI  Physical Exam  Prenatal labs: ABO, Rh:   Antibody:   Rubella:   RPR:    HBsAg:    HIV:    GBS:     Fern neg  Results for orders placed during the hospital encounter of 12/10/10 (from the past 48 hour(s))  URINALYSIS, ROUTINE W REFLEX MICROSCOPIC     Status: Abnormal   Collection Time   12/10/10  2:40 PM      Component Value Range Comment   Color, Urine YELLOW  YELLOW     APPearance HAZY (*) CLEAR     Specific Gravity, Urine 1.025  1.005 - 1.030     pH 6.0  5.0 - 8.0     Glucose, UA NEGATIVE  NEGATIVE (mg/dL)    Hgb urine dipstick NEGATIVE  NEGATIVE       Bilirubin Urine NEGATIVE  NEGATIVE     Ketones, ur 15 (*) NEGATIVE (mg/dL)    Protein, ur NEGATIVE  NEGATIVE (mg/dL)    Urobilinogen, UA 0.2  0.0 - 1.0 (mg/dL)    Nitrite NEGATIVE  NEGATIVE     Leukocytes, UA NEGATIVE  NEGATIVE  MICROSCOPIC NOT DONE ON URINES WITH NEGATIVE PROTEIN, BLOOD, LEUKOCYTES, NITRITE, OR GLUCOSE <1000 mg/dL.  WET PREP, GENITAL     Status: Abnormal   Collection Time   12/10/10  3:30 PM      Component Value Range Comment   Yeast, Wet Prep NONE SEEN  NONE SEEN     Trich, Wet Prep NONE SEEN  NONE SEEN     Clue Cells, Wet Prep NONE SEEN  NONE SEEN     WBC, Wet Prep HPF POC FEW (*) NONE SEEN  FEW BACTERIA SEEN                           FETAL FIBRONECTIN     Status: Abnormal   Collection Time   12/10/10  3:38 PM      Component Value Range Comment  Fetal Fibronectin POSITIVE (*) NEGATIVE     Will give IV bolus, procardia and BMZ per consult w/ Dr. Debroah Loop.  Care turned over to Wynelle Bourgeois, CNM at [address] [place] 12/10/2010, 3:58 PM

## 2010-12-10 NOTE — ED Provider Notes (Signed)
History     Chief Complaint  Patient presents with  . Abdominal Pain   HPI Assumed care from V.Smith CNM. Pt treated today for PTL with + fetal fibronectin. Cervix has remained unchanged. FHR reassuring. Has received 2 doses of Procardia and her first dose of Beta methasone.   Past Medical History  Diagnosis Date  . Melanoma 2003  . Anemia   . Polycystic ovary syndrome   . Kidney stone     Past Surgical History  Procedure Date  . Cesarean section   . Laporascopy   . Tonsillectomy     Family History  Problem Relation Age of Onset  . Cancer Mother     Ovarian  . Cancer Maternal Grandmother     Ovarian  . Diabetes Maternal Grandfather     History  Substance Use Topics  . Smoking status: Never Smoker   . Smokeless tobacco: Not on file  . Alcohol Use: No    Allergies:  Allergies  Allergen Reactions  . Strattera (Atomoxetine Hcl) Nausea And Vomiting and Other (See Comments)    insomnia    Prescriptions prior to admission  Medication Sig Dispense Refill  . IRON COMBINATIONS PO Take by mouth.        [name] PRENATAL VITAMINS PO Take by mouth.        [name] acetaminophen (TYLENOL) 160 MG tablet Take 160 mg by mouth every 6 (six) hours as needed. For fever       . cyclobenzaprine (FLEXERIL) 5 MG tablet Take 2 tablets (10 mg total) by mouth every 8 (eight) hours as needed for muscle spasms.  30 tablet  1  . indomethacin (INDOCIN) 50 MG capsule Take 1 capsule (50 mg total) by mouth 4 (four) times daily.  8 capsule  0    ROS As above  Physical Exam   Blood pressure 104/57, pulse 90, temperature 98.4 F (36.9 C), temperature source Oral, resp. rate 20, height 5\' 8"  (1.727 m), weight 97.977 kg (216 lb), last menstrual period 05/05/2010, SpO2 99.00%.  Physical Exam Cervix closed/FT / long/ high/ soft/ no presenting part felt. FHR reassuring UCs irritability with contractions irregular q 4-5 minutes  MAU Course  Procedures  Assessment and Plan  A:  IUP at 32 weeks  PTL with + FFn P:  Discussed with Dr Shawnie Pons and patient      Will d/c home      Procardia at home      Second dose of betamethasone tomorrow   [facility] 12/10/2010, 6:39 PM

## 2010-12-10 NOTE — Progress Notes (Signed)
Patient states she has been having lower abdominal pressure since her last visit at the office. Today became more intense in the lower abdomen and radiates to back at the hip level. States her panties have been damp but no active leaking. Reports good fetal movement.

## 2010-12-11 ENCOUNTER — Inpatient Hospital Stay (HOSPITAL_COMMUNITY)
Admission: AD | Admit: 2010-12-11 | Discharge: 2010-12-11 | Disposition: A | Discharge status: Home / Self Care | Payer: Managed Care, Other (non HMO) | Source: Ambulatory Visit | Attending: Obstetrics & Gynecology | Admitting: Obstetrics & Gynecology

## 2010-12-11 ENCOUNTER — Other Ambulatory Visit: Payer: Managed Care, Other (non HMO)

## 2010-12-11 DIAGNOSIS — O4700 False labor before 37 completed weeks of gestation, unspecified trimester: Secondary | ICD-10-CM | POA: Insufficient documentation

## 2010-12-11 MED ORDER — BETAMETHASONE SOD PHOS & ACET 6 (3-3) MG/ML IJ SUSP
12.0000 mg | Freq: Once | INTRAMUSCULAR | Status: AC
  Administered 2010-12-11: 12 mg via INTRAMUSCULAR
  Filled 2010-12-11: qty 2

## 2010-12-11 MED ORDER — BETAMETHASONE SOD PHOS & ACET 6 (3-3) MG/ML IJ SUSP: 12.0000 mg | Freq: Once | INTRAMUSCULAR | Status: DC

## 2010-12-14 ENCOUNTER — Ambulatory Visit (INDEPENDENT_AMBULATORY_CARE_PROVIDER_SITE_OTHER): Payer: Managed Care, Other (non HMO) | Admitting: Obstetrics & Gynecology

## 2010-12-14 DIAGNOSIS — Z3480 Encounter for supervision of other normal pregnancy, unspecified trimester: Secondary | ICD-10-CM

## 2010-12-14 DIAGNOSIS — O99210 Obesity complicating pregnancy, unspecified trimester: Secondary | ICD-10-CM

## 2010-12-14 DIAGNOSIS — E669 Obesity, unspecified: Secondary | ICD-10-CM

## 2010-12-14 NOTE — Progress Notes (Signed)
She reports that her back pain is daily and unchanged since her cervix was found to be fingertip.  She has been out of work [employer]) since last Thursday and she wants to know if she will be going back to work. She is rather tearful when talking about how the back pain has affected her activities of daily living (like bathing her 25 year old). I have given her reassurance that her NST was reassuring and there was no unusual uterine activity.  Her internal os was closed and thick.  She will go see chiropractor Dr. Remo Lipps today (if possible).

## 2010-12-14 NOTE — Progress Notes (Signed)
Appointment with [facility] is tomorrow at 10:00.  Patient will report back after this appointment.

## 2010-12-17 ENCOUNTER — Ambulatory Visit (INDEPENDENT_AMBULATORY_CARE_PROVIDER_SITE_OTHER): Payer: Managed Care, Other (non HMO) | Admitting: Family Medicine

## 2010-12-17 ENCOUNTER — Encounter: Payer: Managed Care, Other (non HMO) | Admitting: Family Medicine

## 2010-12-17 VITALS — BP 148/64 | Wt 208.0 lb

## 2010-12-17 DIAGNOSIS — M5430 Sciatica, unspecified side: Secondary | ICD-10-CM

## 2010-12-17 DIAGNOSIS — Z3480 Encounter for supervision of other normal pregnancy, unspecified trimester: Secondary | ICD-10-CM

## 2010-12-17 MED ORDER — OXYCODONE-ACETAMINOPHEN 5-325 MG PO TABS
1.0000 | ORAL_TABLET | Freq: Four times a day (QID) | ORAL | Status: AC | PRN
Start: 2010-12-17 — End: 2010-12-27

## 2010-12-17 NOTE — Progress Notes (Signed)
Patient is here today to discuss her visit with the Chiropractor.  She is still hurting daily to the point her husband has to help her get dressed.  She is having a hard time walking.  The pain shoots through her butt and into her legs.  She wishes to discuss what happens at time of delivery and if moving her date up is an option.

## 2010-12-17 NOTE — Patient Instructions (Addendum)
Back Pain in Pregnancy  Back pain during pregnancy is common. It happens in about half of all pregnancies. It is important for you and your baby that you remain active during your pregnancy.If you feel that back pain is not allowing you to remain active or sleep well, it is time to see your caregiver. Back pain may be caused by several factors related to changes during your pregnancy.Fortunately, unless you had trouble with your back before your pregnancy, the pain is likely to get better after you deliver.  Low back pain usually occurs between the fifth and seventh months of pregnancy. It can, however, happen in the first couple months. Factors that increase the risk of back problems include:    Previous back problems.   Injury to your back.   Having twins or multiple births.   A chronic cough.   Stress.   Job-related repetitive motions.   Muscle or spinal disease in the back.   Family history of back problems, ruptured (herniated) discs, or osteoporosis.   Depression, anxiety, and panic attacks.  CAUSES    When you are pregnant, your body produces a hormone called relaxin. This hormonemakes the ligaments connecting the low back and pubic bones more flexible. This flexibility allows the baby to be delivered more easily. When your ligaments are loose, your muscles need to work harder to support your back. Soreness in your back can come from tired muscles. Soreness can also come from back tissues that are irritated since they are receiving less support.   As the baby grows, it puts pressure on the nerves and blood vessels in your pelvis. This can cause back pain.   As the baby grows and gets heavier during pregnancy, the uterus pushes the stomach muscles forward and changes your center of gravity. This makes your back muscles work harder to maintain good posture.  SYMPTOMS   Lumbar pain during pregnancy  Lumbar pain during pregnancy usually occurs at or above the waist in the center of the back. There  may be pain and numbness that radiates into your leg or foot. This is similar to low back pain experienced by non-pregnant women. It usually increases with sitting for long periods of time, standing, or repetitive lifting. Tenderness may also be present in the muscles along your upper back.  Posterior pelvic pain during pregnancy  Pain in the back of the pelvis is more common than lumbar pain in pregnancy. It is a deep pain felt in your side at the waistline, or across the tailbone (sacrum), or in both places. You may have pain on one or both sides. This pain can also go into the buttocks and backs of the upper thighs. Pubic and groin pain may also be present. The pain does not quickly resolve with rest, and morning stiffness may also be present.  Pelvic pain during pregnancy can be brought on by most activities. A high level of fitness before and during pregnancy may or may not prevent this problem. Labor pain is usually 1 to 2 minutes apart, lasts for about 1 minute, and involves a bearing down feeling or pressure in your pelvis. However, if you are at term with the pregnancy, constant low back pain can be the beginning of early labor, and you should be aware of this.  DIAGNOSIS   X-rays of the back should not be done during the first 12 to 14 weeks of the pregnancy and only when absolutely necessary during the rest of the pregnancy. MRIs do   not give off radiation and are safe during pregnancy. MRIs also should only be done when absolutely necessary.  HOME CARE INSTRUCTIONS   Exercise as directed by your caregiver. Exercise is the most effective way to prevent or manage back pain. If you have a back problem, it is especially important to avoid sports that require sudden body movements. Swimming and walking are great activities.   Do not stand in one place for long periods of time.   Do not wear high heels.   Sit in chairs with good posture. Use a pillow on your lower back if necessary. Make sure your head  rests over your shoulders and is not hanging forward.   Try sleeping on your side, preferably the left side, with a pillow or two between your legs. If you are sore after a night's rest, your bedmay betoo soft.Try placing a board between your mattress and box spring.   Listen to your body when lifting.If you are experiencing pain, ask for help or try bending yourknees more so you can use your leg muscles rather than your back muscles. Squat down when picking up something from the floor. Do not bend over.   Eat a healthy diet. Try to gain weight within your caregiver's recommendations.   Use heat or cold packs 3 to 4 times a day for 15 minutes to help with the pain.   Only take over-the-counter or prescription medicines for pain, discomfort, or fever as directed by your caregiver.  Sudden (acute) back pain   Use bed rest for only the most extreme, acute episodes of back pain. Prolonged bed rest over 48 hours will aggravate your condition.   Ice is very effective for acute conditions.   Put ice in a plastic bag.   Place a towel between your skin and the bag.   Leave the ice on for 10 to 20 minutes every 2 hours, or as needed.   Using heat packs for 30 minutes prior to activities is also helpful.  Continued back pain  See your caregiver if you have continued problems. Your caregiver can help or refer you for appropriate physical therapy. With conditioning, most back problems can be avoided. Sometimes, a more serious issue may be the cause of back pain. You should be seen right away if new problems seem to be developing. Your caregiver may recommend:   A maternity girdle.   An elastic sling.   A back brace.   A massage therapist or acupuncture.  SEEK MEDICAL CARE IF:    You are not able to do most of your daily activities, even when taking the pain medicine you were given.   You need a referral to a physical therapist or chiropractor.   You want to try acupuncture.  SEEK IMMEDIATE MEDICAL CARE  IF:   You develop numbness, tingling, weakness, or problems with the use of your arms or legs.   You develop severe back pain that is no longer relieved with medicines.   You have a sudden change in bowel or bladder control.   You have increasing pain in other areas of the body.   You develop shortness of breath, dizziness, or fainting.   You develop nausea, vomiting, or sweating.   You have back pain which is similar to labor pains.   You have back pain along with your water breaking or vaginal bleeding.   You have back pain or numbness that travels down your leg.   Your back pain developed after   kidney stone.   You see blood in your urine. You may have a bladder infection or kidney stone.   You have back pain with blisters. You may have shingles.  Back pain is fairly common during pregnancy but should not be accepted as just part of the process. Back pain should always be treated as soon as possible. This will make your pregnancy as pleasant as possible. Document Released: 03/31/2005 Document Revised: 09/02/2010 Document Reviewed: 05/12/2010 [facility] Patient Information 2012 [place], [place]. Pregnancy - Third Trimester The third trimester of pregnancy (the last 3 months) is a period of the most rapid growth for you and your baby. The baby approaches a length of 20 inches and a weight of 6 to 10 pounds. The baby is adding on fat and getting ready for life outside your body. While inside, babies have periods of sleeping and waking, suck their thumbs, and hiccups. You can often feel small contractions of the uterus. This is false labor. It is also called Braxton-Hicks contractions. This is like a practice for labor. The usual problems in this stage of pregnancy include more difficulty breathing, swelling of the hands and feet from water retention, and having to urinate more often because  of the uterus and baby pressing on your bladder.  PRENATAL EXAMS  Blood work may continue to be done during prenatal exams. These tests are done to check on your health and the probable health of your baby. Blood work is used to follow your blood levels (hemoglobin). Anemia (low hemoglobin) is common during pregnancy. Iron and vitamins are given to help prevent this. You may also continue to be checked for diabetes. Some of the past blood tests may be done again.   The size of the uterus is measured during each visit. This makes sure your baby is growing properly according to your pregnancy dates.   Your blood pressure is checked every prenatal visit. This is to make sure you are not getting toxemia.   Your urine is checked every prenatal visit for infection, diabetes and protein.   Your weight is checked at each visit. This is done to make sure gains are happening at the suggested rate and that you and your baby are growing normally.   Sometimes, an ultrasound is performed to confirm the position and the proper growth and development of the baby. This is a test done that bounces harmless sound waves off the baby so your caregiver can more accurately determine due dates.   Discuss the type of pain medication and anesthesia you will have during your labor and delivery.   Discuss the possibility and anesthesia if a Cesarean Section might be necessary.   Inform your caregiver if there is any mental or physical violence at home.  Sometimes, a specialized non-stress test, contraction stress test and biophysical profile are done to make sure the baby is not having a problem. Checking the amniotic fluid surrounding the baby is called an amniocentesis. The amniotic fluid is removed by sticking a needle into the belly (abdomen). This is sometimes done near the end of pregnancy if an early delivery is required. In this case, it is done to help make sure the baby's lungs are mature enough for the baby to  live outside of the womb. If the lungs are not mature and it is unsafe to deliver the baby, an injection of cortisone medication is given to the mother 1 to 2 days before the delivery. This helps the baby's lungs mature and makes  it safer to deliver the baby. CHANGES OCCURING IN THE THIRD TRIMESTER OF PREGNANCY Your body goes through many changes during pregnancy. They vary from person to person. Talk to your caregiver about changes you notice and are concerned about.  During the last trimester, you have probably had an increase in your appetite. It is normal to have cravings for certain foods. This varies from person to person and pregnancy to pregnancy.   You may begin to get stretch marks on your hips, abdomen, and breasts. These are normal changes in the body during pregnancy. There are no exercises or medications to take which prevent this change.   Constipation may be treated with a stool softener or adding bulk to your diet. Drinking lots of fluids, fiber in vegetables, fruits, and whole grains are helpful.   Exercising is also helpful. If you have been very active up until your pregnancy, most of these activities can be continued during your pregnancy. If you have been less active, it is helpful to start an exercise program such as walking. Consult your caregiver before starting exercise programs.   Avoid all smoking, alcohol, un-prescribed drugs, herbs and "street drugs" during your pregnancy. These chemicals affect the formation and growth of the baby. Avoid chemicals throughout the pregnancy to ensure the delivery of a healthy infant.   Backache, varicose veins and hemorrhoids may develop or get worse.   You will tire more easily in the third trimester, which is normal.   The baby's movements may be stronger and more often.   You may become short of breath easily.   Your belly button may stick out.   A yellow discharge may leak from your breasts called colostrum.   You may have a  bloody mucus discharge. This usually occurs a few days to a week before labor begins.  HOME CARE INSTRUCTIONS   Keep your caregiver's appointments. Follow your caregiver's instructions regarding medication use, exercise, and diet.   During pregnancy, you are providing food for you and your baby. Continue to eat regular, well-balanced meals. Choose foods such as meat, fish, milk and other low fat dairy products, vegetables, fruits, and whole-grain breads and cereals. Your caregiver will tell you of the ideal weight gain.   A physical sexual relationship may be continued throughout pregnancy if there are no other problems such as early (premature) leaking of amniotic fluid from the membranes, vaginal bleeding, or belly (abdominal) pain.   Exercise regularly if there are no restrictions. Check with your caregiver if you are unsure of the safety of your exercises. Greater weight gain will occur in the last 2 trimesters of pregnancy. Exercising helps:   Control your weight.   Get you in shape for labor and delivery.   You lose weight after you deliver.   Rest a lot with legs elevated, or as needed for leg cramps or low back pain.   Wear a good support or jogging bra for breast tenderness during pregnancy. This may help if worn during sleep. Pads or tissues may be used in the bra if you are leaking colostrum.   Do not use hot tubs, steam rooms, or saunas.   Wear your seat belt when driving. This protects you and your baby if you are in an accident.   Avoid raw meat, cat litter boxes and soil used by cats. These carry germs that can cause birth defects in the baby.   It is easier to loose urine during pregnancy. Tightening up and strengthening the  pelvic muscles will help with this problem. You can practice stopping your urination while you are going to the bathroom. These are the same muscles you need to strengthen. It is also the muscles you would use if you were trying to stop from passing  gas. You can practice tightening these muscles up 10 times a set and repeating this about 3 times per day. Once you know what muscles to tighten up, do not perform these exercises during urination. It is more likely to cause an infection by backing up the urine.   Ask for help if you have financial, counseling or nutritional needs during pregnancy. Your caregiver will be able to offer counseling for these needs as well as refer you for other special needs.   Make a list of emergency phone numbers and have them available.   Plan on getting help from family or friends when you go home from the hospital.   Make a trial run to the hospital.   Take prenatal classes with the father to understand, practice and ask questions about the labor and delivery.   Prepare the baby's room/nursery.   Do not travel out of the city unless it is absolutely necessary and with the advice of your caregiver.   Wear only low or no heal shoes to have better balance and prevent falling.  MEDICATIONS AND DRUG USE IN PREGNANCY  Take prenatal vitamins as directed. The vitamin should contain 1 milligram of folic acid. Keep all vitamins out of reach of children. Only a couple vitamins or tablets containing iron may be fatal to a baby or young child when ingested.   Avoid use of all medications, including herbs, over-the-counter medications, not prescribed or suggested by your caregiver. Only take over-the-counter or prescription medicines for pain, discomfort, or fever as directed by your caregiver. Do not use aspirin, ibuprofen (Motrin, Advil, Nuprin) or naproxen (Aleve) unless OK'd by your caregiver.   Let your caregiver also know about herbs you may be using.   Alcohol is related to a number of birth defects. This includes fetal alcohol syndrome. All alcohol, in any form, should be avoided completely. Smoking will cause low birth rate and premature babies.   Street/illegal drugs are very harmful to the baby. They  are absolutely forbidden. A baby born to an addicted mother will be addicted at birth. The baby will go through the same withdrawal an adult does.  SEEK MEDICAL CARE IF: You have any concerns or worries during your pregnancy. It is better to call with your questions if you feel they cannot wait, rather than worry about them. DECISIONS ABOUT CIRCUMCISION You may or may not know the sex of your baby. If you know your baby is a boy, it may be time to think about circumcision. Circumcision is the removal of the foreskin of the penis. This is the skin that covers the sensitive end of the penis. There is no proven medical need for this. Often this decision is made on what is popular at the time or based upon religious beliefs and social issues. You can discuss these issues with your caregiver or pediatrician. SEEK IMMEDIATE MEDICAL CARE IF:   An unexplained oral temperature above 102 F (38.9 C) develops, or as your caregiver suggests.   You have leaking of fluid from the vagina (birth canal). If leaking membranes are suspected, take your temperature and tell your caregiver of this when you call.   There is vaginal spotting, bleeding or passing clots. Tell your  caregiver of the amount and how many pads are used.   You develop a bad smelling vaginal discharge with a change in the color from clear to white.   You develop vomiting that lasts more than 24 hours.   You develop chills or fever.   You develop shortness of breath.   You develop burning on urination.   You loose more than 2 pounds of weight or gain more than 2 pounds of weight or as suggested by your caregiver.   You notice sudden swelling of your face, hands, and feet or legs.   You develop belly (abdominal) pain. Round ligament discomfort is a common non-cancerous (benign) cause of abdominal pain in pregnancy. Your caregiver still must evaluate you.   You develop a severe headache that does not go away.   You develop visual  problems, blurred or double vision.   If you have not felt your baby move for more than 1 hour. If you think the baby is not moving as much as usual, eat something with sugar in it and lie down on your left side for an hour. The baby should move at least 4 to 5 times per hour. Call right away if your baby moves less than that.   You fall, are in a car accident or any kind of trauma.   There is mental or physical violence at home.  Document Released: 12/15/2000 Document Revised: 09/02/2010 Document Reviewed: 06/19/2008 [facility] Patient Information 2012 [place], [place]. Postpartum Depression After delivery, your body is going through a drastic change in hormone levels. You may find yourself crying for no apparent reason and unable to cope with all the changes a new baby brings. This is a common response following a pregnancy. Seek support from your partner and/or friends and just give yourself time to recover. If these feelings persist and you feel you are getting worse, contact your caregiver or other professionals who can help you. WHAT IS DEPRESSION? Depression can be described as feeling sad, blue, unhappy, miserable, or down in the dumps. Most of [place] feel this way at one time or another for short periods. But true clinical depression is a mood disorder in which feelings of sadness, loss, anger, fear, or frustration interfere with everyday life for an extended time. Depression can be mild, moderate, or severe. The degree of depression, which your caregiver can determine, influences your treatment. Postpartum depression occurs within a couple days to months after delivering your baby. HOW COMMON IS DEPRESSION DURING AND AFTER PREGNANCY? Depression that occurs during pregnancy or within a year after delivery is called perinatal depression. Depression after pregnancy is also called postpartum depression or peripartum depression. The exact number of women with depression during this time is unknown, but  it occurs in between 10-15% of women. Researchers believe that depression is one of the most common complications during and after pregnancy. The depression is often not recognized or treated, because some normal pregnancy changes cause similar symptoms and are happening at the same time. Tiredness, problems sleeping, stronger emotional reactions, and changes in body weight may occur during and after pregnancy. But these symptoms may also be signs of depression.  CAUSES  Rapid hormone changes. Estrogen and progesterone usually decrease immediately after delivering your baby. Researchers think the fast change in hormone levels may lead to depression, just as smaller changes in hormones can affect a woman's moods before she gets her menstrual period.   Decrease in thyroid hormone. Thyroid hormone regulates how your body uses  and stores energy from food (metabolism). A simple blood test can tell if this condition is causing a woman's depression. If so, thyroid medicine can be prescribed by your caregiver.   A stressful life event, such as a death in the family. This can cause chemical changes in the brain that lead to depression.   Feeling overwhelmed by caring for and raising a new baby.   Depression is also an illness that runs in some families. It is not always clear what causes depression.  FACTORS THAT MAY INCREASE A WOMAN'S CHANCE OF DEPRESSION DURING PREGNANCY:  History of depression.   Substance abuse, alcohol, or drugs.   Little support from family and friends.   Problems with previous pregnancy or birth.   Young age for motherhood.   Living alone.   Little or no social support.   Family history of mental illness.   Anxiety about the fetus.   Marital or financial problems.   Postpartum depression in a previous pregnancy.   Having a psychiatric illness (schizophrenia, bipolar disorder).   Going through a difficult or stressful pregnancy.   Going through a difficult labor  and delivery.   Moving to another city or state during your pregnancy, or just after delivering your baby.  OTHER FACTORS THAT MAY CONTRIBUTE TO POSTPARTUM DEPRESSION INCLUDE:   Feeling tired after delivery, broken sleep patterns, and not getting enough rest. This often keeps a new mother from regaining her full strength for weeks.   Feeling overwhelmed with a new baby to take care of and doubting your ability to be a good mother.   Feeling stress from changes in work and home routines. Women sometimes think they need to be "super mom" or perfect. This is not realistic and can add stress.   Having feelings of loss. This can include loss of the identity of who you are, or were, before having the baby, loss of control, loss of your pre-pregnancy figure, and feeling less attractive.   Having less free time and less control over your time. Needing to stay home, indoors, for longer periods of time and having less time to spend with your partner and loved ones can contribute to depression.   Having trouble doing your daily activities at home or at work.   Fears about not knowing how to take of the baby correctly and about harming the baby.   Feelings of guilt that you are not taking care of the baby properly.  SYMPTOMS Any of these symptoms, during and after pregnancy, that last longer than 2 weeks are signs of depression:  Feeling restless or irritable.   Feeling sad, hopeless, and overwhelmed.   Crying a lot.   Having no energy or motivation.   Eating too little or too much.   Sleeping too little or too much.   Trouble focusing, remembering, or making decisions.   Feeling worthless and guilty.   Loss of interest or pleasure in activities.   Withdrawal from friends and family.   Having headaches, chest pains, rapid or irregular heartbeat (palpitations), or fast and shallow breathing (hyperventilation).   After pregnancy, being afraid of hurting the baby or oneself, and not  having any interest in the baby.   Not being able to care for yourself or the baby.   Loss of interest in caring for the baby.   Anxiety and panic attacks.   Thoughts of harming yourself, the baby, or someone else.   Feelings of guilt because you feel you are not  taking care of the baby well enough.  WHAT IS THE DIFFERENCE BETWEEN "BABY BLUES," POSTPARTUM DEPRESSION, AND POSTPARTUM PSYCHOSIS?  The "baby blues" occurs 70 to 80% of the time, and it can happen in the days right after childbirth. It normally goes away within a few days to a week. A new mother can have sudden mood swings, sadness, crying spells, loss of appetite, sleeping problems, and feel irritable, restless, anxious, and lonely. Symptoms are not severe and treatment usually is not needed. But there are things you can do to feel better. Nap when the baby does. Ask for help from your spouse, family members, and friends. Join a support group of new moms or talk with other moms. If the "baby blues" does not go away in a week to 10 days or gets worse, you may have postpartum depression.   Postpartum depression can happen anytime within the first year after childbirth. A woman may have a number of symptoms, such as sadness, lack of energy, trouble concentrating, anxiety, and feelings of guilt and worthlessness. The difference between postpartum depression and the "baby blues" is that the feelings in postpartum depression are much stronger and often affects a woman's well-being. It keeps her from functioning well for a longer period of time. Postpartum depression needs to be treated by a caregiver. Counseling, support groups, and medicines can help.   Postpartum psychosis is rare. It occurs in 1 or 2 out of every 1000 births. It usually begins in the first 6 weeks after delivery. Women who have bipolar disorder, schizoaffective disorder, or family history of psychotic disease have a higher risk for developing postpartum psychosis. Symptoms  may include delusions, hallucinations, sleep disturbances, and obsessive thoughts about the baby. A woman may have rapid mood swings, from depression, to irritability, to euphoria. This is a serious condition and needs professional care and treatment.  WHAT STEPS CAN I TAKE IF I HAVE SYMPTOMS OF DEPRESSION DURING PREGNANCY OR AFTER CHILDBIRTH?  Some women do not tell anyone about their symptoms, because they feel embarrassed, ashamed, or guilty about feeling depressed when they are supposed to be happy. They worry that they will be viewed as unfit parents. Perinatal depression can happen to any woman. It does not mean you are a bad or a "not together" mom. You and your baby do not need to suffer. There is help. You should discuss these feelings with your spouse or partner, family, and caregiver.   There are different types of individual and group "talk therapies" that can help a woman with perinatal depression feel better and do better as a mom and as a person. Limited research suggests that many women with perinatal depression improve when treated with antidepressant medicine. Your caregiver can help you learn more about these options and decide which approach is best for you and your baby.   Speak to your caregiver if you are having symptoms of depression while you are pregnant or after you deliver your baby. Your caregiver can give you a questionnaire to test for depression. You can also be referred to a mental health professional who specializes in treating depression.  HOME CARE INSTRUCTIONS  Try to get as much rest as you can. Try to nap when the baby naps.   Stop putting pressure on yourself to do everything. Do as much as you can and leave the rest.   Ask for help with household chores and nighttime feedings. Ask your partner to bring the baby to you so you can breastfeed.  If you can, have a friend, family member, or professional support person help you in the home for part of the day.   Talk  to your partner, family, and friends about how you are feeling.   Do not spend a lot of time alone. Get dressed and leave the house. Run an errand or take a short walk.   Spend time alone with your partner.   Talk with other mothers so you can learn from their experiences.   Join a support group for women with depression. Call a local hotline or look in your telephone book for information and services.   Do not make any major life changes during pregnancy. Major changes can cause unneeded stress. However, sometimes big changes cannot be avoided. Arrange support and help in your new situation ahead of time.   Exercise regularly.   Eat a balanced and nourishing diet.   Seek help if there are marital or financial problems.   Take the medicine your caregiver gives, as directed.   Keep all your postpartum appointments.  TREATMENT There are 2 common types of treatment for depression.  Talk therapy. This involves talking to a therapist, psychologist, clergyperson, or social worker, in order to learn to change how depression makes you think, feel, and act.   Medicine. Your caregiver can give you an antidepressant medicine to help you. These medicines can help relieve the symptoms of depression.   Women who are pregnant or breast-feeding should talk with their caregivers about the advantages and risks of taking antidepressant medicines. Some women are concerned that taking these medicines may harm the baby. A mother's depression can affect her baby's development. Getting treatment is important for both mother and baby. The risks of taking medicine must be weighed against the risks of depression. It is a decision that women need to discuss carefully with their caregivers. Women who decide to take antidepressant medicines should talk to their caregivers about which antidepressant medicines are safer to take while pregnant or breastfeeding.  What effects can untreated depression have?  Depression  not only hurts the mother, but it also affects her family. Some researchers have found that depression during pregnancy can raise the risk of delivering an underweight baby or a premature infant. Some women with depression have difficulty caring for themselves during pregnancy. They may have trouble eating and do not gain enough weight during the pregnancy. They may also have trouble sleeping, may miss prenatal visits, may not follow medical instructions, have a poor diet, or may use harmful substances, like tobacco, alcohol, or illegal drugs.   Postpartum depression can affect a mother's ability to parent. She may lack energy, have trouble concentrating, be irritable, and not be able to meet her child's needs for love and affection. As a result, she may feel guilty and lose confidence in herself as a mother. This can make the depression worse. Researchers believe that postpartum depression can affect the infant by causing delays in language development, problems with emotional bonding to others, behavioral problems, lower activity levels, sleep problems, and distress. It helps if the father or another caregiver can assist in meeting the needs of the baby, and other children in the family, while the mother is depressed.   All children deserve the chance to have a healthy mom. All moms deserve the chance to enjoy their life and their children. Do not suffer alone. If you are experiencing symptoms of depression during pregnancy or after having a baby, tell a loved one and  call your caregiver right away.  SEEK MEDICAL CARE IF:  You think you have postpartum depression.   You want medicine to treat your postpartum depression.   You want a referral to a psychiatrist or psychologist.   You are having a reaction or problems with your medicine.  SEEK IMMEDIATE MEDICAL CARE IF:  You have suicidal feelings.   You feel you may harm the baby.   You feel you may harm your spouse/partner, or someone else.    You feel you need to be admitted to a hospital now.   You feel you are losing control and need treatment immediately.  FOR MORE INFORMATION [employer] Health Information Center: http://hoffman.com/ National Institute of Mental Health, NIH, HHS: http://www.maynard.net/ American Psychological Association: DiceTournament.ca  Postpartum Education for Parents: www.sbpep.org National Mental Health Information Center, SAMHSA, HHS: www.mentalhealth.org  National Mental Health Association: www.nmha.org Postpartum Support International: www.postpartum.net  Document Released: 09/25/2003 Document Revised: 09/02/2010 Document Reviewed: 01/02/2009 [facility] Patient Information 2012 [place], [place]. Breastfeeding BENEFITS OF BREASTFEEDING For the baby  The first milk (colostrum) helps the baby's digestive system function better.   There are antibodies from the mother in the milk that help the baby fight off infections.   The baby has a lower incidence of asthma, allergies, and SIDS (sudden infant death syndrome).   The nutrients in breast milk are better than formulas for the baby and helps the baby's brain grow better.   Babies who breastfeed have less gas, colic, and constipation.  For the mother  Breastfeeding helps develop a very special bond between mother and baby.   It is more convenient, always available at the correct temperature and cheaper than formula feeding.   It burns calories in the mother and helps with losing weight that was gained during pregnancy.   It makes the uterus contract back down to normal size faster and slows bleeding following delivery.   Breastfeeding mothers have a lower risk of developing breast cancer.  NURSE FREQUENTLY  A healthy, full-term baby may breastfeed as often as every hour or space his or her feedings to every 3 hours.   How often to nurse will vary from baby to baby. Watch your baby for signs of hunger, not the clock.   Nurse as often as the  baby requests, or when you feel the need to reduce the fullness of your breasts.   Awaken the baby if it has been 3 to 4 hours since the last feeding.   Frequent feeding will help the mother make more milk and will prevent problems like sore nipples and engorgement of the breasts.  BABY'S POSITION AT THE BREAST  Whether lying down or sitting, be sure that the baby's tummy is facing your tummy.   Support the breast with 4 fingers underneath the breast and the thumb above. Make sure your fingers are well away from the nipple and baby's mouth.   Stroke the baby's lips and cheek closest to the breast gently with your finger or nipple.   When the baby's mouth is open wide enough, place all of your nipple and as much of the dark area around the nipple as possible into your baby's mouth.   Pull the baby in close so the tip of the nose and the baby's cheeks touch the breast during the feeding.  FEEDINGS  The length of each feeding varies from baby to baby and from feeding to feeding.   The baby must suck about 2 to 3 minutes for your  milk to get to him or her. This is called a "let down." For this reason, allow the baby to feed on each breast as long as he or she wants. Your baby will end the feeding when he or she has received the right balance of nutrients.   To break the suction, put your finger into the corner of the baby's mouth and slide it between his or her gums before removing your breast from his or her mouth. This will help prevent sore nipples.  REDUCING BREAST ENGORGEMENT  In the first week after your baby is born, you may experience signs of breast engorgement. When breasts are engorged, they feel heavy, warm, full, and may be tender to the touch. You can reduce engorgement if you:   Nurse frequently, every 2 to 3 hours. Mothers who breastfeed early and often have fewer problems with engorgement.   Place light ice packs on your breasts between feedings. This reduces swelling. Wrap  the ice packs in a lightweight towel to protect your skin.   Apply moist hot packs to your breast for 5 to 10 minutes before each feeding. This increases circulation and helps the milk flow.   Gently massage your breast before and during the feeding.   Make sure that the baby empties at least one breast at every feeding before switching sides.   Use a breast pump to empty the breasts if your baby is sleepy or not nursing well. You may also want to pump if you are returning to work or or you feel you are getting engorged.   Avoid bottle feeds, pacifiers or supplemental feedings of water or juice in place of breastfeeding.   Be sure the baby is latched on and positioned properly while breastfeeding.   Prevent fatigue, stress, and anemia.   Wear a supportive bra, avoiding underwire styles.   Eat a balanced diet with enough fluids.  If you follow these suggestions, your engorgement should improve in 24 to 48 hours. If you are still experiencing difficulty, call your lactation consultant or caregiver. IS MY BABY GETTING ENOUGH MILK? Sometimes, mothers worry about whether their babies are getting enough milk. You can be assured that your baby is getting enough milk if:  The baby is actively sucking and you hear swallowing.   The baby nurses at least 8 to 12 times in a 24 hour time period. Nurse your baby until he or she unlatches or falls asleep at the first breast (at least 10 to 20 minutes), then offer the second side.   The baby is wetting 5 to 6 disposable diapers (6 to 8 cloth diapers) in a 24 hour period by 15 to 29 days of age.   The baby is having at least 2 to 3 stools every 24 hours for the first few months. Breast milk is all the food your baby needs. It is not necessary for your baby to have water or formula. In fact, to help your breasts make more milk, it is best not to give your baby supplemental feedings during the early weeks.   The stool should be soft and yellow.   The  baby should gain 4 to 7 ounces per week after he is 80 days old.  TAKE CARE OF YOURSELF Take care of your breasts by:  Bathing or showering daily.   Avoiding the use of soaps on your nipples.   Start feedings on your left breast at one feeding and on your right breast at the next  feeding.   You will notice an increase in your milk supply 2 to 5 days after delivery. You may feel some discomfort from engorgement, which makes your breasts very firm and often tender. Engorgement "peaks" out within 24 to 48 hours. In the meantime, apply warm moist towels to your breasts for 5 to 10 minutes before feeding. Gentle massage and expression of some milk before feeding will soften your breasts, making it easier for your baby to latch on. Wear a well fitting nursing bra and air dry your nipples for 10 to 15 minutes after each feeding.   Only use cotton bra pads.   Only use pure lanolin on your nipples after nursing. You do not need to wash it off before nursing.  Take care of yourself by:   Eating well-balanced meals and nutritious snacks.   Drinking milk, fruit juice, and water to satisfy your thirst (about 8 glasses a day).   Getting plenty of rest.   Increasing calcium in your diet (1200 mg a day).   Avoiding foods that you notice affect the baby in a bad way.  SEEK MEDICAL CARE IF:   You have any questions or difficulty with breastfeeding.   You need help.   You have a hard, red, sore area on your breast, accompanied by a fever of 100.5 F (38.1 C) or more.   Your baby is too sleepy to eat well or is having trouble sleeping.   Your baby is wetting less than 6 diapers per day, by 6 days of age.   Your baby's skin or white part of his or her eyes is more yellow than it was in the hospital.   You feel depressed.  Document Released: 12/21/2004 Document Revised: 09/02/2010 Document Reviewed: 08/05/2008 [facility] Patient Information 2012 [place], [place].

## 2010-12-17 NOTE — Progress Notes (Signed)
Continued sciatica and back pain.  Chiropractor has given up.  Will refer to PT. Given small amount of narcotic pain reliever.  Discussed addiction, implications for baby, not taking it daily, etc.  Discussed TOLAC vs. RCS.  She wants RCS at present.

## 2010-12-22 ENCOUNTER — Encounter: Payer: Self-pay | Admitting: Advanced Practice Midwife

## 2010-12-22 ENCOUNTER — Ambulatory Visit: Payer: Managed Care, Other (non HMO) | Admitting: Advanced Practice Midwife

## 2010-12-22 VITALS — BP 87/61 | Wt 209.0 lb

## 2010-12-22 DIAGNOSIS — Z3480 Encounter for supervision of other normal pregnancy, unspecified trimester: Secondary | ICD-10-CM

## 2010-12-22 DIAGNOSIS — M5430 Sciatica, unspecified side: Secondary | ICD-10-CM

## 2010-12-22 NOTE — Progress Notes (Signed)
Discharge has changed to a snot like consistency, she feels like she lost a pretty good size of her mucous plug last night and has been having some irregular contractions.  She took her nifedipine and drank some water, but is still feeling the pressure and having some irregular pains.  She did have a positive FFN in [facility] at 31 weeks.  Ultrasound is scheduled for January.

## 2010-12-22 NOTE — Progress Notes (Signed)
Still having considerable sciatic pain. Irregular contractions:  Cervix unchanged. No leaking or bleeding. Left leg slightly shorter than right. Suggest trial of shoe insert to even up. Recommend keep PT appt as they can lend more to treatment. Will also try Ice for symptomatic relief. Discussed positioning for sleep.

## 2010-12-22 NOTE — Patient Instructions (Signed)

## 2010-12-23 ENCOUNTER — Encounter: Payer: Managed Care, Other (non HMO) | Admitting: Obstetrics and Gynecology

## 2010-12-30 ENCOUNTER — Ambulatory Visit (INDEPENDENT_AMBULATORY_CARE_PROVIDER_SITE_OTHER): Payer: Managed Care, Other (non HMO) | Admitting: Obstetrics & Gynecology

## 2010-12-30 DIAGNOSIS — Z3480 Encounter for supervision of other normal pregnancy, unspecified trimester: Secondary | ICD-10-CM

## 2010-12-30 DIAGNOSIS — G5700 Lesion of sciatic nerve, unspecified lower limb: Secondary | ICD-10-CM

## 2010-12-30 DIAGNOSIS — O34219 Maternal care for unspecified type scar from previous cesarean delivery: Secondary | ICD-10-CM

## 2010-12-30 DIAGNOSIS — M5430 Sciatica, unspecified side: Secondary | ICD-10-CM

## 2010-12-30 NOTE — Progress Notes (Signed)
Addended by: Barbara Cower on: 12/30/2010 03:20 PM   Modules accepted: Orders

## 2010-12-30 NOTE — Progress Notes (Signed)
Sciatic pain is persistent, on pain meds prn. No other complaints or concerns.  Fetal movement and labor precautions reviewed.  Next MFM scan for fetal evaluation and hydrops check is on 01/06/11.

## 2011-01-06 ENCOUNTER — Ambulatory Visit (HOSPITAL_COMMUNITY)
Admission: RE | Admit: 2011-01-06 | Discharge: 2011-01-06 | Disposition: A | Discharge status: Home / Self Care | Payer: Managed Care, Other (non HMO) | Source: Ambulatory Visit | Attending: Obstetrics & Gynecology | Admitting: Obstetrics & Gynecology

## 2011-01-06 DIAGNOSIS — O34219 Maternal care for unspecified type scar from previous cesarean delivery: Secondary | ICD-10-CM | POA: Insufficient documentation

## 2011-01-06 DIAGNOSIS — O09299 Supervision of pregnancy with other poor reproductive or obstetric history, unspecified trimester: Secondary | ICD-10-CM | POA: Insufficient documentation

## 2011-01-06 DIAGNOSIS — O2690 Pregnancy related conditions, unspecified, unspecified trimester: Secondary | ICD-10-CM

## 2011-01-06 DIAGNOSIS — O352XX0 Maternal care for (suspected) hereditary disease in fetus, not applicable or unspecified: Secondary | ICD-10-CM | POA: Insufficient documentation

## 2011-01-06 NOTE — Progress Notes (Signed)
Ms. Cerullo was seen for ultrasound appointment today.  Please see [facility] report for details.

## 2011-01-12 ENCOUNTER — Ambulatory Visit (INDEPENDENT_AMBULATORY_CARE_PROVIDER_SITE_OTHER): Payer: Managed Care, Other (non HMO) | Admitting: Family Medicine

## 2011-01-12 VITALS — BP 91/69 | Wt 212.4 lb

## 2011-01-12 DIAGNOSIS — Z3480 Encounter for supervision of other normal pregnancy, unspecified trimester: Secondary | ICD-10-CM

## 2011-01-12 NOTE — Progress Notes (Signed)
MFM--reports nml growth, no need for further growth scans---weekly scanning for hydrops. Cultures today.  No evidence of hydrops today.  Considering TOLAC if goes into labor prior to c-section.

## 2011-01-12 NOTE — Patient Instructions (Signed)
Birth Control Choices Birth control is the use of any practices, methods, or devices to prevent pregnancy from happening in a sexually active woman.  Below are some birth control choices to help avoid pregnancy.  Not having sex (abstinence) is the surest form of birth control. This requires self-control. There is no risk of acquiring a sexually transmitted disease (STD), including acquired immunodeficiency syndrome (AIDS).   Periodic abstinence requires self-control during certain times of the month.   Calendar method, timing your menstrual periods from month to month.   Ovulation method is avoiding sexual intercourse around the time you produce an egg (ovulate).   Symptotherm method is avoiding sexual intercourse at the time of ovulation, using a thermometer and ovulation symptoms.   Post ovulation method is the timing of sexual intercourse after you ovulated.  These methods do not protect against STDs, including AIDS.  Birth control pills (BCPs) contain estrogen and progesterone hormone. These medicines work by stopping the egg from forming in the ovary (ovulation). Birth control pills are prescribed by a caregiver who will ask you questions about the risks of taking BCPs. Birth control pills do not protect against STDs, including AIDS.   "Minipill" birth control pills have only the progesterone hormone. They are taken every day of each month and must be prescribed by your caregiver. They do not protect against STDs, including AIDS.   Emergency contraception is often call the "morning after" pill. This pill can be taken right after sex or up to five days after sex if you think your birth control failed, you failed to use contraception, or you were forced to have sex. It is most effective the sooner you take the pills after having sexual intercourse. Do not use emergency contraception as your only form of birth control. Emergency contraceptive pills are available without a prescription. Check  with your pharmacist.   Condoms are a thin sheath of latex, synthetic material, or lambskin worn over the penis during sexual intercourse. They can have a spermicide in or on them when you buy them. Latex condoms can prevent pregnancy and STDs. "Natural" or lambskin condoms can prevent pregnancy but may not protect against STDs, including AIDS.   Female condoms are a soft, loose-fitting sheath that is put into the vagina before sexual intercourse. They can prevent pregnancy and STDs, including AIDS.   Sponge is a soft, circular piece of polyurethane foam with spermicide in it that is inserted into the vagina after wetting it and before sexual intercourse. It does not require a prescription from your caregiver. It does not protect against STDs, including AIDS.   Diaphragm is a soft, latex, dome-shaped barrier that must be fitted by a caregiver. It is inserted into the vagina, along with a spermicidal jelly. After the proper fitting for a diaphragm, always insert the diaphragm before intercourse. The diaphragm should be left in the vagina for 6 to 8 hours after intercourse. Removal and reinsertion with a spermicide is always necessary after any use. It does not protect against STDs, including AIDS.   Progesterone-only injections are given every 3 months to prevent pregnancy. These injections contain synthetic progesterone and no estrogen. This hormone stops the ovaries from releasing eggs. It also causes the cervical mucus to thicken and changes the uterine lining. This makes it harder for sperm to survive in the uterus. It does not protect against STDs, including AIDS.   Birth Control Patch contains hormones similar to those in birth control pills, so effectiveness, risks, and side effects   are similar. It must be changed once a week and is prescribed by a caregiver. It is less effective in very overweight women. It does not protect against STDs, including AIDS.   Vaginal Ring contains hormones similar  to those in birth control pills. It is left in place for 3 weeks, removed for 1 week, and then a new one is put back into the vagina. It comes with a timer to put in your purse to help you remember when to take it out or put a new one in. A caregiver's examination and prescription is necessary, just like with birth control pills and the patch. It does not protect against STDs, including AIDS.   Estrogen plus progesterone injections are given every 28 to 30 days. They can be given in the upper arm, thigh, or buttocks. It does not protect against STDs, including AIDS.   Intrauterine device (IUD): copper T or progestin filled is a T-shaped device that is put in a woman's uterus during a menstrual period to prevent pregnancy. The copper T IUD can last 10 years, and the progestin IUD can last 5 years. The progestin IUD can also help control heavy menstrual periods. It does not protect against STDs, including AIDS. The copper T IUD can be used as emergency contraception if inserted within 5 days of having unprotected intercourse.   Cervical cap is a round, soft latex or plastic cup that fits over the cervix and must be fitted by a caregiver. You do not need to use a spermicide with it or remove and insert it every time you have sexual intercourse. It does not protect against STDs, including AIDS.   Spermicides are chemicals that kill or block sperm from entering the cervix and uterus. They come in the form of creams, jellies, suppositories, foam, or tablets, and they do not require a prescription. They are inserted into the vagina with an applicator before having sexual intercourse. This must be repeated every time you have sexual intercourse.   Withdrawal is using the method of the female withdrawing his penis from sexual intercourse before he has a climax and deposits his sperm. It does not protect against STDs, including AIDS.   Female tubal ligation is when the woman's fallopian tubes are surgically sealed  or tied to prevent the egg from traveling to the uterus. It does not protect against STDs, including AIDS.   Female sterilization is when the female has his tubes that carry sperm tied off (vasectomy) to stop sperm from entering the vagina during sexual intercourse. It does not protect against STDs, including AIDS.  Regardless of which method of birth control you choose, it is still important that you use some form of protection against STDs. Document Released: 12/21/2004 Document Revised: 01/23/2010 Document Reviewed: 11/07/2008 [employer] Patient Information 2012 [employer]. Postpartum Depression After delivery, your body is going through a drastic change in hormone levels. You may find yourself crying for no apparent reason and unable to cope with all the changes a new baby brings. This is a common response following a pregnancy. Seek support from your partner and/or friends and just give yourself time to recover. If these feelings persist and you feel you are getting worse, contact your caregiver or other professionals who can help you. WHAT IS DEPRESSION? Depression can be described as feeling sad, blue, unhappy, miserable, or down in the dumps. Most of us feel this way at one time or another for short periods. But true clinical depression is a mood disorder   in which feelings of sadness, loss, anger, fear, or frustration interfere with everyday life for an extended time. Depression can be mild, moderate, or severe. The degree of depression, which your caregiver can determine, influences your treatment. Postpartum depression occurs within a couple days to months after delivering your baby. HOW COMMON IS DEPRESSION DURING AND AFTER PREGNANCY? Depression that occurs during pregnancy or within a year after delivery is called perinatal depression. Depression after pregnancy is also called postpartum depression or peripartum depression. The exact number of women with depression during this time is unknown,  but it occurs in between 10-15% of women. Researchers believe that depression is one of the most common complications during and after pregnancy. The depression is often not recognized or treated, because some normal pregnancy changes cause similar symptoms and are happening at the same time. Tiredness, problems sleeping, stronger emotional reactions, and changes in body weight may occur during and after pregnancy. But these symptoms may also be signs of depression.  CAUSES  Rapid hormone changes. Estrogen and progesterone usually decrease immediately after delivering your baby. Researchers think the fast change in hormone levels may lead to depression, just as smaller changes in hormones can affect a woman's moods before she gets her menstrual period.   Decrease in thyroid hormone. Thyroid hormone regulates how your body uses and stores energy from food (metabolism). A simple blood test can tell if this condition is causing a woman's depression. If so, thyroid medicine can be prescribed by your caregiver.   A stressful life event, such as a death in the family. This can cause chemical changes in the brain that lead to depression.   Feeling overwhelmed by caring for and raising a new baby.   Depression is also an illness that runs in some families. It is not always clear what causes depression.  FACTORS THAT MAY INCREASE A WOMAN'S CHANCE OF DEPRESSION DURING PREGNANCY:  History of depression.   Substance abuse, alcohol, or drugs.   Little support from family and friends.   Problems with previous pregnancy or birth.   Young age for motherhood.   Living alone.   Little or no social support.   Family history of mental illness.   Anxiety about the fetus.   Marital or financial problems.   Postpartum depression in a previous pregnancy.   Having a psychiatric illness (schizophrenia, bipolar disorder).   Going through a difficult or stressful pregnancy.   Going through a difficult  labor and delivery.   Moving to another city or state during your pregnancy, or just after delivering your baby.  OTHER FACTORS THAT MAY CONTRIBUTE TO POSTPARTUM DEPRESSION INCLUDE:   Feeling tired after delivery, broken sleep patterns, and not getting enough rest. This often keeps a new mother from regaining her full strength for weeks.   Feeling overwhelmed with a new baby to take care of and doubting your ability to be a good mother.   Feeling stress from changes in work and home routines. Women sometimes think they need to be "super mom" or perfect. This is not realistic and can add stress.   Having feelings of loss. This can include loss of the identity of who you are, or were, before having the baby, loss of control, loss of your pre-pregnancy figure, and feeling less attractive.   Having less free time and less control over your time. Needing to stay home, indoors, for longer periods of time and having less time to spend with your partner and loved ones can   contribute to depression.   Having trouble doing your daily activities at home or at work.   Fears about not knowing how to take of the baby correctly and about harming the baby.   Feelings of guilt that you are not taking care of the baby properly.  SYMPTOMS Any of these symptoms, during and after pregnancy, that last longer than 2 weeks are signs of depression:  Feeling restless or irritable.   Feeling sad, hopeless, and overwhelmed.   Crying a lot.   Having no energy or motivation.   Eating too little or too much.   Sleeping too little or too much.   Trouble focusing, remembering, or making decisions.   Feeling worthless and guilty.   Loss of interest or pleasure in activities.   Withdrawal from friends and family.   Having headaches, chest pains, rapid or irregular heartbeat (palpitations), or fast and shallow breathing (hyperventilation).   After pregnancy, being afraid of hurting the baby or oneself, and  not having any interest in the baby.   Not being able to care for yourself or the baby.   Loss of interest in caring for the baby.   Anxiety and panic attacks.   Thoughts of harming yourself, the baby, or someone else.   Feelings of guilt because you feel you are not taking care of the baby well enough.  WHAT IS THE DIFFERENCE BETWEEN "BABY BLUES," POSTPARTUM DEPRESSION, AND POSTPARTUM PSYCHOSIS?  The "baby blues" occurs 70 to 80% of the time, and it can happen in the days right after childbirth. It normally goes away within a few days to a week. A new mother can have sudden mood swings, sadness, crying spells, loss of appetite, sleeping problems, and feel irritable, restless, anxious, and lonely. Symptoms are not severe and treatment usually is not needed. But there are things you can do to feel better. Nap when the baby does. Ask for help from your spouse, family members, and friends. Join a support group of new moms or talk with other moms. If the "baby blues" does not go away in a week to 10 days or gets worse, you may have postpartum depression.   Postpartum depression can happen anytime within the first year after childbirth. A woman may have a number of symptoms, such as sadness, lack of energy, trouble concentrating, anxiety, and feelings of guilt and worthlessness. The difference between postpartum depression and the "baby blues" is that the feelings in postpartum depression are much stronger and often affects a woman's well-being. It keeps her from functioning well for a longer period of time. Postpartum depression needs to be treated by a caregiver. Counseling, support groups, and medicines can help.   Postpartum psychosis is rare. It occurs in 1 or 2 out of every 1000 births. It usually begins in the first 6 weeks after delivery. Women who have bipolar disorder, schizoaffective disorder, or family history of psychotic disease have a higher risk for developing postpartum psychosis.  Symptoms may include delusions, hallucinations, sleep disturbances, and obsessive thoughts about the baby. A woman may have rapid mood swings, from depression, to irritability, to euphoria. This is a serious condition and needs professional care and treatment.  WHAT STEPS CAN I TAKE IF I HAVE SYMPTOMS OF DEPRESSION DURING PREGNANCY OR AFTER CHILDBIRTH?  Some women do not tell anyone about their symptoms, because they feel embarrassed, ashamed, or guilty about feeling depressed when they are supposed to be happy. They worry that they will be viewed as unfit parents.   Perinatal depression can happen to any woman. It does not mean you are a bad or a "not together" mom. You and your baby do not need to suffer. There is help. You should discuss these feelings with your spouse or partner, family, and caregiver.   There are different types of individual and group "talk therapies" that can help a woman with perinatal depression feel better and do better as a mom and as a person. Limited research suggests that many women with perinatal depression improve when treated with antidepressant medicine. Your caregiver can help you learn more about these options and decide which approach is best for you and your baby.   Speak to your caregiver if you are having symptoms of depression while you are pregnant or after you deliver your baby. Your caregiver can give you a questionnaire to test for depression. You can also be referred to a mental health professional who specializes in treating depression.  HOME CARE INSTRUCTIONS  Try to get as much rest as you can. Try to nap when the baby naps.   Stop putting pressure on yourself to do everything. Do as much as you can and leave the rest.   Ask for help with household chores and nighttime feedings. Ask your partner to bring the baby to you so you can breastfeed. If you can, have a friend, family member, or professional support person help you in the home for part of the day.    Talk to your partner, family, and friends about how you are feeling.   Do not spend a lot of time alone. Get dressed and leave the house. Run an errand or take a short walk.   Spend time alone with your partner.   Talk with other mothers so you can learn from their experiences.   Join a support group for women with depression. Call a local hotline or look in your telephone book for information and services.   Do not make any major life changes during pregnancy. Major changes can cause unneeded stress. However, sometimes big changes cannot be avoided. Arrange support and help in your new situation ahead of time.   Exercise regularly.   Eat a balanced and nourishing diet.   Seek help if there are marital or financial problems.   Take the medicine your caregiver gives, as directed.   Keep all your postpartum appointments.  TREATMENT There are 2 common types of treatment for depression.  Talk therapy. This involves talking to a therapist, psychologist, clergyperson, or social worker, in order to learn to change how depression makes you think, feel, and act.   Medicine. Your caregiver can give you an antidepressant medicine to help you. These medicines can help relieve the symptoms of depression.   Women who are pregnant or breast-feeding should talk with their caregivers about the advantages and risks of taking antidepressant medicines. Some women are concerned that taking these medicines may harm the baby. A mother's depression can affect her baby's development. Getting treatment is important for both mother and baby. The risks of taking medicine must be weighed against the risks of depression. It is a decision that women need to discuss carefully with their caregivers. Women who decide to take antidepressant medicines should talk to their caregivers about which antidepressant medicines are safer to take while pregnant or breastfeeding.  What effects can untreated depression  have?  Depression not only hurts the mother, but it also affects her family. Some researchers have found that depression during pregnancy   can raise the risk of delivering an underweight baby or a premature infant. Some women with depression have difficulty caring for themselves during pregnancy. They may have trouble eating and do not gain enough weight during the pregnancy. They may also have trouble sleeping, may miss prenatal visits, may not follow medical instructions, have a poor diet, or may use harmful substances, like tobacco, alcohol, or illegal drugs.   Postpartum depression can affect a mother's ability to parent. She may lack energy, have trouble concentrating, be irritable, and not be able to meet her child's needs for love and affection. As a result, she may feel guilty and lose confidence in herself as a mother. This can make the depression worse. Researchers believe that postpartum depression can affect the infant by causing delays in language development, problems with emotional bonding to others, behavioral problems, lower activity levels, sleep problems, and distress. It helps if the father or another caregiver can assist in meeting the needs of the baby, and other children in the family, while the mother is depressed.   All children deserve the chance to have a healthy mom. All moms deserve the chance to enjoy their life and their children. Do not suffer alone. If you are experiencing symptoms of depression during pregnancy or after having a baby, tell a loved one and call your caregiver right away.  SEEK MEDICAL CARE IF:  You think you have postpartum depression.   You want medicine to treat your postpartum depression.   You want a referral to a psychiatrist or psychologist.   You are having a reaction or problems with your medicine.  SEEK IMMEDIATE MEDICAL CARE IF:  You have suicidal feelings.   You feel you may harm the baby.   You feel you may harm your spouse/partner,  or someone else.   You feel you need to be admitted to a hospital now.   You feel you are losing control and need treatment immediately.  FOR MORE INFORMATION National Women's Health Information Center: www.womenshealth.gov National Institute of Mental Health, NIH, HHS: www.nimh.nih.gov American Psychological Association: www.apa.org  Postpartum Education for Parents: www.sbpep.org National Mental Health Information Center, SAMHSA, HHS: www.mentalhealth.org  National Mental Health Association: www.nmha.org Postpartum Support International: www.postpartum.net  Document Released: 09/25/2003 Document Revised: 09/02/2010 Document Reviewed: 01/02/2009 [employer] Patient Information 2012 [employer]. Breastfeeding BENEFITS OF BREASTFEEDING For the baby  The first milk (colostrum) helps the baby's digestive system function better.   There are antibodies from the mother in the milk that help the baby fight off infections.   The baby has a lower incidence of asthma, allergies, and SIDS (sudden infant death syndrome).   The nutrients in breast milk are better than formulas for the baby and helps the baby's brain grow better.   Babies who breastfeed have less gas, colic, and constipation.  For the mother  Breastfeeding helps develop a very special bond between mother and baby.   It is more convenient, always available at the correct temperature and cheaper than formula feeding.   It burns calories in the mother and helps with losing weight that was gained during pregnancy.   It makes the uterus contract back down to normal size faster and slows bleeding following delivery.   Breastfeeding mothers have a lower risk of developing breast cancer.  NURSE FREQUENTLY  A healthy, full-term baby may breastfeed as often as every hour or space his or her feedings to every 3 hours.   How often to nurse will vary from baby to   baby. Watch your baby for signs of hunger, not the clock.   Nurse as  often as the baby requests, or when you feel the need to reduce the fullness of your breasts.   Awaken the baby if it has been 3 to 4 hours since the last feeding.   Frequent feeding will help the mother make more milk and will prevent problems like sore nipples and engorgement of the breasts.  BABY'S POSITION AT THE BREAST  Whether lying down or sitting, be sure that the baby's tummy is facing your tummy.   Support the breast with 4 fingers underneath the breast and the thumb above. Make sure your fingers are well away from the nipple and baby's mouth.   Stroke the baby's lips and cheek closest to the breast gently with your finger or nipple.   When the baby's mouth is open wide enough, place all of your nipple and as much of the dark area around the nipple as possible into your baby's mouth.   Pull the baby in close so the tip of the nose and the baby's cheeks touch the breast during the feeding.  FEEDINGS  The length of each feeding varies from baby to baby and from feeding to feeding.   The baby must suck about 2 to 3 minutes for your milk to get to him or her. This is called a "let down." For this reason, allow the baby to feed on each breast as long as he or she wants. Your baby will end the feeding when he or she has received the right balance of nutrients.   To break the suction, put your finger into the corner of the baby's mouth and slide it between his or her gums before removing your breast from his or her mouth. This will help prevent sore nipples.  REDUCING BREAST ENGORGEMENT  In the first week after your baby is born, you may experience signs of breast engorgement. When breasts are engorged, they feel heavy, warm, full, and may be tender to the touch. You can reduce engorgement if you:   Nurse frequently, every 2 to 3 hours. Mothers who breastfeed early and often have fewer problems with engorgement.   Place light ice packs on your breasts between feedings. This reduces  swelling. Wrap the ice packs in a lightweight towel to protect your skin.   Apply moist hot packs to your breast for 5 to 10 minutes before each feeding. This increases circulation and helps the milk flow.   Gently massage your breast before and during the feeding.   Make sure that the baby empties at least one breast at every feeding before switching sides.   Use a breast pump to empty the breasts if your baby is sleepy or not nursing well. You may also want to pump if you are returning to work or or you feel you are getting engorged.   Avoid bottle feeds, pacifiers or supplemental feedings of water or juice in place of breastfeeding.   Be sure the baby is latched on and positioned properly while breastfeeding.   Prevent fatigue, stress, and anemia.   Wear a supportive bra, avoiding underwire styles.   Eat a balanced diet with enough fluids.  If you follow these suggestions, your engorgement should improve in 24 to 48 hours. If you are still experiencing difficulty, call your lactation consultant or caregiver. IS MY BABY GETTING ENOUGH MILK? Sometimes, mothers worry about whether their babies are getting enough milk. You can be   assured that your baby is getting enough milk if:  The baby is actively sucking and you hear swallowing.   The baby nurses at least 8 to 12 times in a 24 hour time period. Nurse your baby until he or she unlatches or falls asleep at the first breast (at least 10 to 20 minutes), then offer the second side.   The baby is wetting 5 to 6 disposable diapers (6 to 8 cloth diapers) in a 24 hour period by 5 to 6 days of age.   The baby is having at least 2 to 3 stools every 24 hours for the first few months. Breast milk is all the food your baby needs. It is not necessary for your baby to have water or formula. In fact, to help your breasts make more milk, it is best not to give your baby supplemental feedings during the early weeks.   The stool should be soft and  yellow.   The baby should gain 4 to 7 ounces per week after he is 4 days old.  TAKE CARE OF YOURSELF Take care of your breasts by:  Bathing or showering daily.   Avoiding the use of soaps on your nipples.   Start feedings on your left breast at one feeding and on your right breast at the next feeding.   You will notice an increase in your milk supply 2 to 5 days after delivery. You may feel some discomfort from engorgement, which makes your breasts very firm and often tender. Engorgement "peaks" out within 24 to 48 hours. In the meantime, apply warm moist towels to your breasts for 5 to 10 minutes before feeding. Gentle massage and expression of some milk before feeding will soften your breasts, making it easier for your baby to latch on. Wear a well fitting nursing bra and air dry your nipples for 10 to 15 minutes after each feeding.   Only use cotton bra pads.   Only use pure lanolin on your nipples after nursing. You do not need to wash it off before nursing.  Take care of yourself by:   Eating well-balanced meals and nutritious snacks.   Drinking milk, fruit juice, and water to satisfy your thirst (about 8 glasses a day).   Getting plenty of rest.   Increasing calcium in your diet (1200 mg a day).   Avoiding foods that you notice affect the baby in a bad way.  SEEK MEDICAL CARE IF:   You have any questions or difficulty with breastfeeding.   You need help.   You have a hard, red, sore area on your breast, accompanied by a fever of 100.5 F (38.1 C) or more.   Your baby is too sleepy to eat well or is having trouble sleeping.   Your baby is wetting less than 6 diapers per day, by 5 days of age.   Your baby's skin or white part of his or her eyes is more yellow than it was in the hospital.   You feel depressed.  Document Released: 12/21/2004 Document Revised: 09/02/2010 Document Reviewed: 08/05/2008 [employer] Patient Information 2012 [employer]. 

## 2011-01-17 ENCOUNTER — Encounter (HOSPITAL_COMMUNITY): Payer: Self-pay | Admitting: *Deleted

## 2011-01-17 ENCOUNTER — Inpatient Hospital Stay (HOSPITAL_COMMUNITY)
Admission: AD | Admit: 2011-01-17 | Discharge: 2011-01-17 | Disposition: A | Discharge status: Home / Self Care | Payer: Managed Care, Other (non HMO) | Source: Ambulatory Visit | Attending: Obstetrics and Gynecology | Admitting: Obstetrics and Gynecology

## 2011-01-17 DIAGNOSIS — R109 Unspecified abdominal pain: Secondary | ICD-10-CM | POA: Insufficient documentation

## 2011-01-17 DIAGNOSIS — O479 False labor, unspecified: Secondary | ICD-10-CM

## 2011-01-17 DIAGNOSIS — O4700 False labor before 37 completed weeks of gestation, unspecified trimester: Principal | ICD-10-CM | POA: Insufficient documentation

## 2011-01-17 HISTORY — DX: Nausea with vomiting, unspecified: R11.2

## 2011-01-17 HISTORY — DX: Other specified postprocedural states: Z98.890

## 2011-01-17 LAB — URINALYSIS, ROUTINE W REFLEX MICROSCOPIC
Bilirubin Urine: NEGATIVE
Hgb urine dipstick: NEGATIVE
Nitrite: NEGATIVE
Specific Gravity, Urine: 1.02 (ref 1.005–1.030)
Urobilinogen, UA: 2 mg/dL — ABNORMAL HIGH (ref 0.0–1.0)
pH: 7 (ref 5.0–8.0)

## 2011-01-17 MED ORDER — ZOLPIDEM TARTRATE 5 MG PO TABS
5.0000 mg | ORAL_TABLET | Freq: Once | ORAL | Status: AC
  Administered 2011-01-17: 5 mg via ORAL
  Filled 2011-01-17: qty 1

## 2011-01-17 NOTE — Progress Notes (Signed)
Pt G3 P2 at 36.5wks, having cramping, back pain and leaking small amt of clear/cloudy fluid around 1900.  Pt reports, PTL stopped taking procardia 1/8.

## 2011-01-17 NOTE — ED Provider Notes (Signed)
History     Chief Complaint  Patient presents with  . Abdominal Cramping  . Back Pain  . Rupture of Membranes   HPI This is a 26 yo G3P2001 at 36 weeks and 5 days who presents to [facility] with question of ruptured membranes and contractions.  Felt gush of fluid around 7pm, but no continued leaking.  Contractions started about 12 hours ago and has stayed fairly regular throughout the day.  No radiation, nothing improves or worsens the contractions.  OB History    Grav Para Term Preterm Abortions TAB SAB Ect Mult Living   3 2 2  0 0 0 0 0 0 1      Past Medical History  Diagnosis Date  . Melanoma 2003  . Anemia   . Polycystic ovary syndrome   . Kidney stone   . PONV (postoperative nausea and vomiting)     Past Surgical History  Procedure Date  . Cesarean section   . Laporascopy   . Tonsillectomy     Family History  Problem Relation Age of Onset  . Cancer Mother     Ovarian  . Cancer Maternal Grandmother     Ovarian  . Diabetes Maternal Grandfather   . Anesthesia problems Neg Hx   . Hypotension Neg Hx   . Malignant hyperthermia Neg Hx   . Pseudochol deficiency Neg Hx     History  Substance Use Topics  . Smoking status: Never Smoker   . Smokeless tobacco: Not on file  . Alcohol Use: No    Allergies:  Allergies  Allergen Reactions  . Strattera (Atomoxetine Hcl) Nausea And Vomiting and Other (See Comments)    insomnia     (Not in a hospital admission)  ROS Physical Exam   Blood pressure 114/66, pulse 85, temperature 98.6 F (37 C), temperature source Oral, resp. rate 18, height 5\' 7"  (1.702 m), weight 97.977 kg (216 lb), last menstrual period 05/05/2010.  Physical Exam  Constitutional: She is oriented to person, place, and time. She appears well-developed and well-nourished.  HENT:  Head: Normocephalic.  Cardiovascular: Normal rate.   Respiratory: Effort normal.  GI: Soft. Bowel sounds are normal. She exhibits no distension and no mass. There is no  tenderness. There is no rebound and no guarding.       Fundus at term.  Vertex by leopold.   Genitourinary: Vagina normal.       No pooling.  Musculoskeletal: Normal range of motion.  Neurological: She is alert and oriented to person, place, and time.  Skin: Skin is warm and dry.  Psychiatric: She has a normal mood and affect. Her behavior is normal. Judgment and thought content normal.   Dilation: 2 Effacement (%): 50 Cervical Position: Posterior Exam by:: B Mosca  Fern neg. NST - category 1.  MAU Course  Procedures  Assessment and Plan  1.  IUP at 36 weeks and 5 days 2.  Braxton hicks contractions  Patient sent home.  Labor precautions given.  Encouraged oral fluids.  Pape Parson JEHIEL 01/17/2011, 10:40 PM

## 2011-01-17 NOTE — Progress Notes (Signed)
Pt reports leaking of fluid since 1900. Pt also reports "period like cramps" radiating into her back.

## 2011-01-19 ENCOUNTER — Encounter: Payer: Self-pay | Admitting: Advanced Practice Midwife

## 2011-01-19 ENCOUNTER — Ambulatory Visit: Payer: Managed Care, Other (non HMO) | Admitting: Advanced Practice Midwife

## 2011-01-19 VITALS — BP 105/72 | Wt 217.0 lb

## 2011-01-19 DIAGNOSIS — Z3480 Encounter for supervision of other normal pregnancy, unspecified trimester: Secondary | ICD-10-CM

## 2011-01-19 NOTE — Progress Notes (Signed)
Still bothered by sciatica. Weekly USs for hydrops within normal limits so far. Culture results pending from [facility].  GBS neg.  Cervix 1+/70/-3/vtx.  Membranes swept.  Discussed VBAC/Section. States if comes into hospital in labor, may consider VBAC

## 2011-01-19 NOTE — Patient Instructions (Signed)
Trial of Labor After Cesarean Information A trial of labor after cesarean (TOLAC) is when a woman tries to give birth vaginally after a previous cesarean delivery. When successful, this is called a vaginal birth after cesarean (VBAC). TOLAC may be a safe and appropriate option for you depending on your history and other risk factors. The chances of a successful VBAC depends on the reason you previously had a cesarean. Women have higher VBAC success rates if their cesarean was due to:   A breech (malpresentation) baby.   Emergency reasons.   Medical factors like hypertension.  Women have lower VBAC success rates if their cesarean was done because they:   Did not dilate.   Could not push their baby out.  Talk to your caregiver about VBAC benefits, risks, and success rates. Discuss your plans for having more children. After this is done, you and your caregiver can decide whether to attempt TOLAC.  MOST SUCCESSFUL CANDIDATES FOR TOLAC TOLAC is possible for some women who:  Had 1 past horizontal (low transverse) incision during a cesarean.   Are carrying twins and had 1 past low transverse incision during a cesarean.   Do not have a very large (macrosomic) baby.   Do not have a vertical (classical) uterine scar.   Are in labor and are less than [redacted] weeks pregnant.  TOLAC is also supported for women who meet appropriate criteria and:  Are under the age of 94.   Are tall and have a body mass index (BMI) of less than 30.   Are likely carrying a baby that is at an average, or less than average, birth weight.   Have an unknown uterine scar.   Deliver in a hospital with a proper medical team. This team should be able to handle possible complications such as a uterine rupture.   Have thorough counseling about the benefits and risks of TOLAC.   Have discussed future pregnancy plans with their caregiver.   Plan to have several more pregnancies.  TOLAC may be most appropriate for women  who meet the above guidelines and who plan to have more pregnancies. TOLAC is not recommended for home births. LEAST SUCCESSFUL CANDIDATES FOR TOLAC TOLAC is least successful for women who:  Have an induced labor with an unfavorable cervix. An unfavorable cervix is when the cerix is not dilating sufficiently (among other factors).   Have never had a vaginal delivery.   Had a past cesarean for failed progress.   Had a past cesarean due to abnormal fetal heart rate patterns on the monitor (nonrassuring tracing).   Have a macrosomic baby.   Are postterm. This means the pregancy has lasted beyond 42 weeks from the first day of the last menstrual period.  There are no high-quality studies comparing the risks and benefits of TOLAC and elective repeat cesarean deliveries. SUGGESTED BENEFITS OF TOLAC The benefits of TOLAC include:   Having a faster recovery time.   Having less pain than with a cesarean.   Having the partner involved in the delivery process.  SUGGESTED RISKS OF TOLAC The highest risk of complications happens to women who attempt a TOLAC and fail. A failed TOLAC results in an unplanned cesarean delivery. Risks related to [facility] or repeat cesarean surgeries include:   Blood loss.   Infection.   Blood clot.   Injury to surrounding tissues or organs.   Removal of the uterus (hysterectomy).   Potential problems with the placenta (placenta previa, placental acreta) in future pregnancies.  While very rare, the main concerns with TOLAC are:  Rupture of the uterine scar from a past cesarean surgery.   Needing an emergency cesarean surgery.   Having a bad outcome for the baby (perinatal morbidity).   Having closely spaced pregnancies (less than 6 months apart).  ADVANTAGES TO HAVING A REPEAT CESAREAN DELIVERY   It is convenient to be able to schedule a cesarean delivery.   A woman can easily have surgery to prevent future pregnancies (sterilization) during a cesarean,  if desired.   There is a lower rate of hysterectomy later in life due to the uterus falling down (pelvic relaxation).   The risks associated with TOLAC are not applicable.  FOR MORE INFORMATION [employer] of Obstetricians and Gynecologists: www.acog.org [employer]: www.midwife.org Document Released: 09/08/2010 Document Reviewed: 07/17/2010 [facility] Patient Information 2012 [place], [place].

## 2011-01-19 NOTE — Progress Notes (Signed)
Addended by: Vinnie Langton C on: 01/19/2011 09:19 AM   Modules accepted: Orders

## 2011-01-20 LAB — GC/CHLAMYDIA PROBE AMP, URINE
Chlamydia, Swab/Urine, PCR: NEGATIVE
GC Probe Amp, Urine: NEGATIVE

## 2011-01-25 ENCOUNTER — Ambulatory Visit (INDEPENDENT_AMBULATORY_CARE_PROVIDER_SITE_OTHER): Payer: Managed Care, Other (non HMO) | Admitting: Family Medicine

## 2011-01-25 ENCOUNTER — Encounter (HOSPITAL_COMMUNITY): Payer: Self-pay

## 2011-01-25 ENCOUNTER — Encounter (HOSPITAL_COMMUNITY)
Admission: RE | Admit: 2011-01-25 | Discharge: 2011-01-25 | Disposition: A | Discharge status: Home / Self Care | Payer: Managed Care, Other (non HMO) | Source: Ambulatory Visit | Attending: Family Medicine | Admitting: Family Medicine

## 2011-01-25 DIAGNOSIS — O34219 Maternal care for unspecified type scar from previous cesarean delivery: Secondary | ICD-10-CM

## 2011-01-25 DIAGNOSIS — Z3480 Encounter for supervision of other normal pregnancy, unspecified trimester: Secondary | ICD-10-CM

## 2011-01-25 HISTORY — DX: Encounter for other specified aftercare: Z51.89

## 2011-01-25 HISTORY — DX: Allergy status to unspecified drugs, medicaments and biological substances: Z88.9

## 2011-01-25 LAB — CBC
MCH: 23.6 pg — ABNORMAL LOW (ref 26.0–34.0)
MCHC: 29.9 g/dL — ABNORMAL LOW (ref 30.0–36.0)
MCV: 79 fL (ref 78.0–100.0)
Platelets: 131 10*3/uL — ABNORMAL LOW (ref 150–400)
RBC: 3.43 MIL/uL — ABNORMAL LOW (ref 3.87–5.11)
RDW: 16.9 % — ABNORMAL HIGH (ref 11.5–15.5)

## 2011-01-25 LAB — SURGICAL PCR SCREEN: MRSA, PCR: NEGATIVE

## 2011-01-25 NOTE — Patient Instructions (Signed)
Birth Control Choices Birth control is the use of any practices, methods, or devices to prevent pregnancy from happening in a sexually active woman.  Below are some birth control choices to help avoid pregnancy.  Not having sex (abstinence) is the surest form of birth control. This requires self-control. There is no risk of acquiring a sexually transmitted disease (STD), including acquired immunodeficiency syndrome (AIDS).   Periodic abstinence requires self-control during certain times of the month.   Calendar method, timing your menstrual periods from month to month.   Ovulation method is avoiding sexual intercourse around the time you produce an egg (ovulate).   Symptotherm method is avoiding sexual intercourse at the time of ovulation, using a thermometer and ovulation symptoms.   Post ovulation method is the timing of sexual intercourse after you ovulated.  These methods do not protect against STDs, including AIDS.  Birth control pills (BCPs) contain estrogen and progesterone hormone. These medicines work by stopping the egg from forming in the ovary (ovulation). Birth control pills are prescribed by a caregiver who will ask you questions about the risks of taking BCPs. Birth control pills do not protect against STDs, including AIDS.   "Minipill" birth control pills have only the progesterone hormone. They are taken every day of each month and must be prescribed by your caregiver. They do not protect against STDs, including AIDS.   Emergency contraception is often call the "morning after" pill. This pill can be taken right after sex or up to five days after sex if you think your birth control failed, you failed to use contraception, or you were forced to have sex. It is most effective the sooner you take the pills after having sexual intercourse. Do not use emergency contraception as your only form of birth control. Emergency contraceptive pills are available without a prescription. Check  with your pharmacist.   Condoms are a thin sheath of latex, synthetic material, or lambskin worn over the penis during sexual intercourse. They can have a spermicide in or on them when you buy them. Latex condoms can prevent pregnancy and STDs. "Natural" or lambskin condoms can prevent pregnancy but may not protect against STDs, including AIDS.   Female condoms are a soft, loose-fitting sheath that is put into the vagina before sexual intercourse. They can prevent pregnancy and STDs, including AIDS.   Sponge is a soft, circular piece of polyurethane foam with spermicide in it that is inserted into the vagina after wetting it and before sexual intercourse. It does not require a prescription from your caregiver. It does not protect against STDs, including AIDS.   Diaphragm is a soft, latex, dome-shaped barrier that must be fitted by a caregiver. It is inserted into the vagina, along with a spermicidal jelly. After the proper fitting for a diaphragm, always insert the diaphragm before intercourse. The diaphragm should be left in the vagina for 6 to 8 hours after intercourse. Removal and reinsertion with a spermicide is always necessary after any use. It does not protect against STDs, including AIDS.   Progesterone-only injections are given every 3 months to prevent pregnancy. These injections contain synthetic progesterone and no estrogen. This hormone stops the ovaries from releasing eggs. It also causes the cervical mucus to thicken and changes the uterine lining. This makes it harder for sperm to survive in the uterus. It does not protect against STDs, including AIDS.   Birth Control Patch contains hormones similar to those in birth control pills, so effectiveness, risks, and side effects   are similar. It must be changed once a week and is prescribed by a caregiver. It is less effective in very overweight women. It does not protect against STDs, including AIDS.   Vaginal Ring contains hormones similar  to those in birth control pills. It is left in place for 3 weeks, removed for 1 week, and then a new one is put back into the vagina. It comes with a timer to put in your purse to help you remember when to take it out or put a new one in. A caregiver's examination and prescription is necessary, just like with birth control pills and the patch. It does not protect against STDs, including AIDS.   Estrogen plus progesterone injections are given every 28 to 30 days. They can be given in the upper arm, thigh, or buttocks. It does not protect against STDs, including AIDS.   Intrauterine device (IUD): copper T or progestin filled is a T-shaped device that is put in a woman's uterus during a menstrual period to prevent pregnancy. The copper T IUD can last 10 years, and the progestin IUD can last 5 years. The progestin IUD can also help control heavy menstrual periods. It does not protect against STDs, including AIDS. The copper T IUD can be used as emergency contraception if inserted within 5 days of having unprotected intercourse.   Cervical cap is a round, soft latex or plastic cup that fits over the cervix and must be fitted by a caregiver. You do not need to use a spermicide with it or remove and insert it every time you have sexual intercourse. It does not protect against STDs, including AIDS.   Spermicides are chemicals that kill or block sperm from entering the cervix and uterus. They come in the form of creams, jellies, suppositories, foam, or tablets, and they do not require a prescription. They are inserted into the vagina with an applicator before having sexual intercourse. This must be repeated every time you have sexual intercourse.   Withdrawal is using the method of the female withdrawing his penis from sexual intercourse before he has a climax and deposits his sperm. It does not protect against STDs, including AIDS.   Female tubal ligation is when the woman's fallopian tubes are surgically sealed  or tied to prevent the egg from traveling to the uterus. It does not protect against STDs, including AIDS.   Female sterilization is when the female has his tubes that carry sperm tied off (vasectomy) to stop sperm from entering the vagina during sexual intercourse. It does not protect against STDs, including AIDS.  Regardless of which method of birth control you choose, it is still important that you use some form of protection against STDs. Document Released: 12/21/2004 Document Revised: 01/23/2010 Document Reviewed: 11/07/2008 [employer] Patient Information 2012 [employer]. Breastfeeding BENEFITS OF BREASTFEEDING For the baby  The first milk (colostrum) helps the baby's digestive system function better.   There are antibodies from the mother in the milk that help the baby fight off infections.   The baby has a lower incidence of asthma, allergies, and SIDS (sudden infant death syndrome).   The nutrients in breast milk are better than formulas for the baby and helps the baby's brain grow better.   Babies who breastfeed have less gas, colic, and constipation.  For the mother  Breastfeeding helps develop a very special bond between mother and baby.   It is more convenient, always available at the correct temperature and cheaper than formula feeding.     It burns calories in the mother and helps with losing weight that was gained during pregnancy.   It makes the uterus contract back down to normal size faster and slows bleeding following delivery.   Breastfeeding mothers have a lower risk of developing breast cancer.  NURSE FREQUENTLY  A healthy, full-term baby may breastfeed as often as every hour or space his or her feedings to every 3 hours.   How often to nurse will vary from baby to baby. Watch your baby for signs of hunger, not the clock.   Nurse as often as the baby requests, or when you feel the need to reduce the fullness of your breasts.   Awaken the baby if it has been 3  to 4 hours since the last feeding.   Frequent feeding will help the mother make more milk and will prevent problems like sore nipples and engorgement of the breasts.  BABY'S POSITION AT THE BREAST  Whether lying down or sitting, be sure that the baby's tummy is facing your tummy.   Support the breast with 4 fingers underneath the breast and the thumb above. Make sure your fingers are well away from the nipple and baby's mouth.   Stroke the baby's lips and cheek closest to the breast gently with your finger or nipple.   When the baby's mouth is open wide enough, place all of your nipple and as much of the dark area around the nipple as possible into your baby's mouth.   Pull the baby in close so the tip of the nose and the baby's cheeks touch the breast during the feeding.  FEEDINGS  The length of each feeding varies from baby to baby and from feeding to feeding.   The baby must suck about 2 to 3 minutes for your milk to get to him or her. This is called a "let down." For this reason, allow the baby to feed on each breast as long as he or she wants. Your baby will end the feeding when he or she has received the right balance of nutrients.   To break the suction, put your finger into the corner of the baby's mouth and slide it between his or her gums before removing your breast from his or her mouth. This will help prevent sore nipples.  REDUCING BREAST ENGORGEMENT  In the first week after your baby is born, you may experience signs of breast engorgement. When breasts are engorged, they feel heavy, warm, full, and may be tender to the touch. You can reduce engorgement if you:   Nurse frequently, every 2 to 3 hours. Mothers who breastfeed early and often have fewer problems with engorgement.   Place light ice packs on your breasts between feedings. This reduces swelling. Wrap the ice packs in a lightweight towel to protect your skin.   Apply moist hot packs to your breast for 5 to 10  minutes before each feeding. This increases circulation and helps the milk flow.   Gently massage your breast before and during the feeding.   Make sure that the baby empties at least one breast at every feeding before switching sides.   Use a breast pump to empty the breasts if your baby is sleepy or not nursing well. You may also want to pump if you are returning to work or or you feel you are getting engorged.   Avoid bottle feeds, pacifiers or supplemental feedings of water or juice in place of breastfeeding.   Be sure   the baby is latched on and positioned properly while breastfeeding.   Prevent fatigue, stress, and anemia.   Wear a supportive bra, avoiding underwire styles.   Eat a balanced diet with enough fluids.  If you follow these suggestions, your engorgement should improve in 24 to 48 hours. If you are still experiencing difficulty, call your lactation consultant or caregiver. IS MY BABY GETTING ENOUGH MILK? Sometimes, mothers worry about whether their babies are getting enough milk. You can be assured that your baby is getting enough milk if:  The baby is actively sucking and you hear swallowing.   The baby nurses at least 8 to 12 times in a 24 hour time period. Nurse your baby until he or she unlatches or falls asleep at the first breast (at least 10 to 20 minutes), then offer the second side.   The baby is wetting 5 to 6 disposable diapers (6 to 8 cloth diapers) in a 24 hour period by 5 to 6 days of age.   The baby is having at least 2 to 3 stools every 24 hours for the first few months. Breast milk is all the food your baby needs. It is not necessary for your baby to have water or formula. In fact, to help your breasts make more milk, it is best not to give your baby supplemental feedings during the early weeks.   The stool should be soft and yellow.   The baby should gain 4 to 7 ounces per week after he is 4 days old.  TAKE CARE OF YOURSELF Take care of your breasts  by:  Bathing or showering daily.   Avoiding the use of soaps on your nipples.   Start feedings on your left breast at one feeding and on your right breast at the next feeding.   You will notice an increase in your milk supply 2 to 5 days after delivery. You may feel some discomfort from engorgement, which makes your breasts very firm and often tender. Engorgement "peaks" out within 24 to 48 hours. In the meantime, apply warm moist towels to your breasts for 5 to 10 minutes before feeding. Gentle massage and expression of some milk before feeding will soften your breasts, making it easier for your baby to latch on. Wear a well fitting nursing bra and air dry your nipples for 10 to 15 minutes after each feeding.   Only use cotton bra pads.   Only use pure lanolin on your nipples after nursing. You do not need to wash it off before nursing.  Take care of yourself by:   Eating well-balanced meals and nutritious snacks.   Drinking milk, fruit juice, and water to satisfy your thirst (about 8 glasses a day).   Getting plenty of rest.   Increasing calcium in your diet (1200 mg a day).   Avoiding foods that you notice affect the baby in a bad way.  SEEK MEDICAL CARE IF:   You have any questions or difficulty with breastfeeding.   You need help.   You have a hard, red, sore area on your breast, accompanied by a fever of 100.5 F (38.1 C) or more.   Your baby is too sleepy to eat well or is having trouble sleeping.   Your baby is wetting less than 6 diapers per day, by 5 days of age.   Your baby's skin or white part of his or her eyes is more yellow than it was in the hospital.   You feel   depressed.  Document Released: 12/21/2004 Document Revised: 09/02/2010 Document Reviewed: 08/05/2008 [employer] Patient Information 2012 [employer]. 

## 2011-01-25 NOTE — Pre-Procedure Instructions (Signed)
Ok to see patient DOS. 

## 2011-01-25 NOTE — Progress Notes (Signed)
Doing well Membranes stripped 

## 2011-01-25 NOTE — Patient Instructions (Signed)
   Your procedure is scheduled on: Tuesday, Jan 29th  Enter through the Main Entrance of [facility] at: 11:30am Pick up the phone at the desk and dial [phone] and inform us of your arrival.  Please call this number if you have any problems the morning of surgery: [phone]  Remember: Do not eat food after midnight: Monday Do not drink clear liquids after: 9am Tuesday Take these medicines the morning of surgery with a SIP OF WATER: None  Do not wear jewelry, make-up, or FINGER nail polish Do not wear lotions, powders, perfumes or deodorant. Do not shave 48 hours prior to surgery. Do not bring valuables to the hospital.  Leave suitcase in the car. After Surgery it may be brought to your room. For patients being admitted to the hospital, checkout time is 11:00am the day of discharge. Home with Husband "Anayi Bricco  cell [phone]  Remember to use your hibiclens as instructed.Please shower with 1/2 bottle the evening before your surgery and the other 1/2 bottle the morning of surgery.

## 2011-01-26 LAB — RPR: RPR Ser Ql: NONREACTIVE

## 2011-02-02 ENCOUNTER — Encounter (HOSPITAL_COMMUNITY): Payer: Self-pay | Admitting: Family Medicine

## 2011-02-02 ENCOUNTER — Encounter (HOSPITAL_COMMUNITY): Payer: Self-pay | Admitting: Anesthesiology

## 2011-02-02 ENCOUNTER — Inpatient Hospital Stay (HOSPITAL_COMMUNITY): Payer: Managed Care, Other (non HMO) | Admitting: Anesthesiology

## 2011-02-02 ENCOUNTER — Inpatient Hospital Stay (HOSPITAL_COMMUNITY)
Admission: RE | Admit: 2011-02-02 | Discharge: 2011-02-04 | DRG: 766 | Disposition: A | Discharge status: Home / Self Care | Payer: Managed Care, Other (non HMO) | Source: Ambulatory Visit | Attending: Family Medicine | Admitting: Family Medicine

## 2011-02-02 ENCOUNTER — Encounter (HOSPITAL_COMMUNITY): Payer: Self-pay

## 2011-02-02 ENCOUNTER — Encounter (HOSPITAL_COMMUNITY)
Admission: RE | Disposition: A | Discharge status: Home / Self Care | Payer: Self-pay | Source: Ambulatory Visit | Attending: Family Medicine

## 2011-02-02 DIAGNOSIS — E65 Localized adiposity: Secondary | ICD-10-CM | POA: Diagnosis present

## 2011-02-02 DIAGNOSIS — E669 Obesity, unspecified: Secondary | ICD-10-CM | POA: Diagnosis present

## 2011-02-02 DIAGNOSIS — O34219 Maternal care for unspecified type scar from previous cesarean delivery: Secondary | ICD-10-CM

## 2011-02-02 DIAGNOSIS — O9903 Anemia complicating the puerperium: Secondary | ICD-10-CM | POA: Diagnosis not present

## 2011-02-02 DIAGNOSIS — O99214 Obesity complicating childbirth: Secondary | ICD-10-CM

## 2011-02-02 DIAGNOSIS — D649 Anemia, unspecified: Secondary | ICD-10-CM | POA: Diagnosis not present

## 2011-02-02 DIAGNOSIS — Z3480 Encounter for supervision of other normal pregnancy, unspecified trimester: Secondary | ICD-10-CM

## 2011-02-02 DIAGNOSIS — O9989 Other specified diseases and conditions complicating pregnancy, childbirth and the puerperium: Secondary | ICD-10-CM

## 2011-02-02 DIAGNOSIS — O99892 Other specified diseases and conditions complicating childbirth: Secondary | ICD-10-CM | POA: Diagnosis present

## 2011-02-02 HISTORY — PX: SCAR REVISION: SHX5285

## 2011-02-02 LAB — ABO/RH: ABO/RH(D): A POS

## 2011-02-02 SURGERY — Surgical Case
Anesthesia: Spinal | Site: Abdomen

## 2011-02-02 MED ORDER — IBUPROFEN 600 MG PO TABS
600.0000 mg | ORAL_TABLET | Freq: Four times a day (QID) | ORAL | Status: DC
  Administered 2011-02-02 – 2011-02-04 (×6): 600 mg via ORAL
  Filled 2011-02-02 (×4): qty 1

## 2011-02-02 MED ORDER — FENTANYL CITRATE 0.05 MG/ML IJ SOLN
INTRAMUSCULAR | Status: DC | PRN
  Administered 2011-02-02: 25 ug via INTRATHECAL

## 2011-02-02 MED ORDER — DIPHENHYDRAMINE HCL 50 MG/ML IJ SOLN: 12.5000 mg | INTRAMUSCULAR | Status: DC | PRN

## 2011-02-02 MED ORDER — SODIUM CHLORIDE 0.9 % IJ SOLN: 3.0000 mL | INTRAMUSCULAR | Status: DC | PRN

## 2011-02-02 MED ORDER — METOCLOPRAMIDE HCL 5 MG/ML IJ SOLN
INTRAMUSCULAR | Status: AC
  Administered 2011-02-02: 10 mg via INTRAVENOUS
  Filled 2011-02-02: qty 2

## 2011-02-02 MED ORDER — OXYTOCIN 20 UNITS IN LACTATED RINGERS INFUSION - SIMPLE
INTRAVENOUS | Status: AC
  Administered 2011-02-02: 20 [IU]
  Filled 2011-02-02: qty 1000

## 2011-02-02 MED ORDER — SENNOSIDES-DOCUSATE SODIUM 8.6-50 MG PO TABS
2.0000 | ORAL_TABLET | Freq: Every day | ORAL | Status: DC
  Administered 2011-02-03: 2 via ORAL

## 2011-02-02 MED ORDER — TETANUS-DIPHTH-ACELL PERTUSSIS 5-2.5-18.5 LF-MCG/0.5 IM SUSP: 0.5000 mL | Freq: Once | INTRAMUSCULAR | Status: DC

## 2011-02-02 MED ORDER — ONDANSETRON HCL 4 MG/2ML IJ SOLN: 4.0000 mg | Freq: Three times a day (TID) | INTRAMUSCULAR | Status: DC | PRN

## 2011-02-02 MED ORDER — SCOPOLAMINE 1 MG/3DAYS TD PT72: 1.0000 | MEDICATED_PATCH | Freq: Once | TRANSDERMAL | Status: DC

## 2011-02-02 MED ORDER — DIPHENHYDRAMINE HCL 25 MG PO CAPS: 25.0000 mg | ORAL_CAPSULE | Freq: Four times a day (QID) | ORAL | Status: DC | PRN

## 2011-02-02 MED ORDER — FENTANYL CITRATE 0.05 MG/ML IJ SOLN
25.0000 ug | INTRAMUSCULAR | Status: DC | PRN
  Administered 2011-02-02 (×2): 50 ug via INTRAVENOUS

## 2011-02-02 MED ORDER — SCOPOLAMINE 1 MG/3DAYS TD PT72
1.0000 | MEDICATED_PATCH | Freq: Once | TRANSDERMAL | Status: DC
  Administered 2011-02-02: 1.5 mg via TRANSDERMAL

## 2011-02-02 MED ORDER — BUPIVACAINE HCL (PF) 0.25 % IJ SOLN: INTRAMUSCULAR | Status: DC | PRN

## 2011-02-02 MED ORDER — DEXAMETHASONE SODIUM PHOSPHATE 10 MG/ML IJ SOLN
INTRAMUSCULAR | Status: DC | PRN
  Administered 2011-02-02: 10 mg via INTRAVENOUS

## 2011-02-02 MED ORDER — MEASLES, MUMPS & RUBELLA VAC ~~LOC~~ INJ
0.5000 mL | INJECTION | Freq: Once | SUBCUTANEOUS | Status: DC
  Filled 2011-02-02: qty 0.5

## 2011-02-02 MED ORDER — ACETAMINOPHEN 10 MG/ML IV SOLN
1000.0000 mg | Freq: Four times a day (QID) | INTRAVENOUS | Status: AC | PRN
  Filled 2011-02-02: qty 100

## 2011-02-02 MED ORDER — DIPHENHYDRAMINE HCL 25 MG PO CAPS
25.0000 mg | ORAL_CAPSULE | ORAL | Status: DC | PRN
  Filled 2011-02-02: qty 1

## 2011-02-02 MED ORDER — OXYTOCIN 20 UNITS IN LACTATED RINGERS INFUSION - SIMPLE: 125.0000 mL/h | INTRAVENOUS | Status: AC

## 2011-02-02 MED ORDER — MORPHINE SULFATE 0.5 MG/ML IJ SOLN
INTRAMUSCULAR | Status: AC
  Filled 2011-02-02: qty 10

## 2011-02-02 MED ORDER — BUPIVACAINE IN DEXTROSE 0.75-8.25 % IT SOLN
INTRATHECAL | Status: DC | PRN
  Administered 2011-02-02: 12 mg via INTRATHECAL

## 2011-02-02 MED ORDER — MUPIROCIN 2 % EX OINT
TOPICAL_OINTMENT | Freq: Two times a day (BID) | CUTANEOUS | Status: DC
  Administered 2011-02-02: 12:00:00 via NASAL

## 2011-02-02 MED ORDER — DIPHENHYDRAMINE HCL 50 MG/ML IJ SOLN: 25.0000 mg | INTRAMUSCULAR | Status: DC | PRN

## 2011-02-02 MED ORDER — NALOXONE HCL 0.4 MG/ML IJ SOLN: 0.4000 mg | INTRAMUSCULAR | Status: DC | PRN

## 2011-02-02 MED ORDER — NALBUPHINE HCL 10 MG/ML IJ SOLN
5.0000 mg | INTRAMUSCULAR | Status: DC | PRN
  Filled 2011-02-02: qty 1

## 2011-02-02 MED ORDER — METOCLOPRAMIDE HCL 5 MG/ML IJ SOLN: 10.0000 mg | Freq: Three times a day (TID) | INTRAMUSCULAR | Status: DC | PRN

## 2011-02-02 MED ORDER — IBUPROFEN 600 MG PO TABS
600.0000 mg | ORAL_TABLET | Freq: Four times a day (QID) | ORAL | Status: DC | PRN
  Administered 2011-02-04: 600 mg via ORAL
  Filled 2011-02-02: qty 1

## 2011-02-02 MED ORDER — ACETAMINOPHEN 325 MG PO TABS: 325.0000 mg | ORAL_TABLET | ORAL | Status: DC | PRN

## 2011-02-02 MED ORDER — KETOROLAC TROMETHAMINE 30 MG/ML IJ SOLN: 30.0000 mg | Freq: Four times a day (QID) | INTRAMUSCULAR | Status: AC | PRN

## 2011-02-02 MED ORDER — KETOROLAC TROMETHAMINE 30 MG/ML IJ SOLN
INTRAMUSCULAR | Status: AC
  Administered 2011-02-02: 30 mg via INTRAVENOUS
  Filled 2011-02-02: qty 1

## 2011-02-02 MED ORDER — MORPHINE SULFATE (PF) 0.5 MG/ML IJ SOLN
INTRAMUSCULAR | Status: DC | PRN
  Administered 2011-02-02: .1 mg via INTRATHECAL

## 2011-02-02 MED ORDER — ONDANSETRON HCL 4 MG/2ML IJ SOLN
INTRAMUSCULAR | Status: AC
  Filled 2011-02-02: qty 2

## 2011-02-02 MED ORDER — DIPHENHYDRAMINE HCL 25 MG PO CAPS: 25.0000 mg | ORAL_CAPSULE | ORAL | Status: DC | PRN

## 2011-02-02 MED ORDER — PRENATAL MULTIVITAMIN CH
1.0000 | ORAL_TABLET | Freq: Every day | ORAL | Status: DC
  Administered 2011-02-03: 1 via ORAL
  Filled 2011-02-02 (×2): qty 1

## 2011-02-02 MED ORDER — SCOPOLAMINE 1 MG/3DAYS TD PT72
1.0000 | MEDICATED_PATCH | Freq: Once | TRANSDERMAL | Status: DC
  Filled 2011-02-02: qty 1

## 2011-02-02 MED ORDER — NALBUPHINE HCL 10 MG/ML IJ SOLN: 5.0000 mg | INTRAMUSCULAR | Status: DC | PRN

## 2011-02-02 MED ORDER — EPHEDRINE 5 MG/ML INJ
INTRAVENOUS | Status: AC
  Filled 2011-02-02: qty 10

## 2011-02-02 MED ORDER — DEXTROSE IN LACTATED RINGERS 5 % IV SOLN
INTRAVENOUS | Status: DC
  Administered 2011-02-02 – 2011-02-03 (×2): via INTRAVENOUS

## 2011-02-02 MED ORDER — OXYCODONE-ACETAMINOPHEN 5-325 MG PO TABS: 1.0000 | ORAL_TABLET | ORAL | Status: DC | PRN

## 2011-02-02 MED ORDER — ONDANSETRON HCL 4 MG/2ML IJ SOLN: 4.0000 mg | INTRAMUSCULAR | Status: DC | PRN

## 2011-02-02 MED ORDER — IBUPROFEN 600 MG PO TABS: 600.0000 mg | ORAL_TABLET | Freq: Four times a day (QID) | ORAL | Status: DC | PRN

## 2011-02-02 MED ORDER — METOCLOPRAMIDE HCL 5 MG/ML IJ SOLN
10.0000 mg | Freq: Three times a day (TID) | INTRAMUSCULAR | Status: DC | PRN
  Administered 2011-02-02: 10 mg via INTRAVENOUS

## 2011-02-02 MED ORDER — MEPERIDINE HCL 25 MG/ML IJ SOLN: 6.2500 mg | INTRAMUSCULAR | Status: DC | PRN

## 2011-02-02 MED ORDER — MUPIROCIN 2 % EX OINT
TOPICAL_OINTMENT | CUTANEOUS | Status: AC
  Filled 2011-02-02: qty 22

## 2011-02-02 MED ORDER — CEFAZOLIN SODIUM 1-5 GM-% IV SOLN
1.0000 g | INTRAVENOUS | Status: AC
  Administered 2011-02-02: 1 g via INTRAVENOUS

## 2011-02-02 MED ORDER — MENTHOL 3 MG MT LOZG: 1.0000 | LOZENGE | OROMUCOSAL | Status: DC | PRN

## 2011-02-02 MED ORDER — PROMETHAZINE HCL 25 MG/ML IJ SOLN
6.2500 mg | INTRAMUSCULAR | Status: DC | PRN
  Administered 2011-02-02: 12.5 mg via INTRAVENOUS

## 2011-02-02 MED ORDER — EPHEDRINE SULFATE 50 MG/ML IJ SOLN
INTRAMUSCULAR | Status: DC | PRN
  Administered 2011-02-02 (×3): 10 mg via INTRAVENOUS

## 2011-02-02 MED ORDER — SODIUM CHLORIDE 0.9 % IV SOLN
1.0000 ug/kg/h | INTRAVENOUS | Status: DC | PRN
  Filled 2011-02-02: qty 2.5

## 2011-02-02 MED ORDER — LACTATED RINGERS IV SOLN
INTRAVENOUS | Status: DC
  Administered 2011-02-02 (×3): via INTRAVENOUS

## 2011-02-02 MED ORDER — KETOROLAC TROMETHAMINE 30 MG/ML IJ SOLN
30.0000 mg | Freq: Four times a day (QID) | INTRAMUSCULAR | Status: DC | PRN
  Administered 2011-02-02: 30 mg via INTRAVENOUS

## 2011-02-02 MED ORDER — KETOROLAC TROMETHAMINE 30 MG/ML IJ SOLN: 30.0000 mg | Freq: Four times a day (QID) | INTRAMUSCULAR | Status: DC | PRN

## 2011-02-02 MED ORDER — DIBUCAINE 1 % RE OINT: 1.0000 "application " | TOPICAL_OINTMENT | RECTAL | Status: DC | PRN

## 2011-02-02 MED ORDER — LANOLIN HYDROUS EX OINT: 1.0000 "application " | TOPICAL_OINTMENT | CUTANEOUS | Status: DC | PRN

## 2011-02-02 MED ORDER — DEXAMETHASONE SODIUM PHOSPHATE 10 MG/ML IJ SOLN
INTRAMUSCULAR | Status: AC
  Filled 2011-02-02: qty 1

## 2011-02-02 MED ORDER — BUPIVACAINE HCL (PF) 0.25 % IJ SOLN
INTRAMUSCULAR | Status: AC
  Filled 2011-02-02: qty 30

## 2011-02-02 MED ORDER — OXYTOCIN 10 UNIT/ML IJ SOLN
INTRAMUSCULAR | Status: AC
  Filled 2011-02-02: qty 4

## 2011-02-02 MED ORDER — MUPIROCIN 2 % EX OINT
1.0000 "application " | TOPICAL_OINTMENT | Freq: Two times a day (BID) | CUTANEOUS | Status: DC
  Administered 2011-02-02 – 2011-02-03 (×3): 1 via NASAL

## 2011-02-02 MED ORDER — CHLORHEXIDINE GLUCONATE CLOTH 2 % EX PADS
6.0000 | MEDICATED_PAD | Freq: Every day | CUTANEOUS | Status: DC
  Administered 2011-02-03: 6 via TOPICAL

## 2011-02-02 MED ORDER — SIMETHICONE 80 MG PO CHEW
80.0000 mg | CHEWABLE_TABLET | Freq: Three times a day (TID) | ORAL | Status: DC
  Administered 2011-02-02 – 2011-02-03 (×5): 80 mg via ORAL

## 2011-02-02 MED ORDER — FENTANYL CITRATE 0.05 MG/ML IJ SOLN
INTRAMUSCULAR | Status: DC | PRN
  Administered 2011-02-02: 75 ug via INTRAVENOUS

## 2011-02-02 MED ORDER — FENTANYL CITRATE 0.05 MG/ML IJ SOLN
INTRAMUSCULAR | Status: AC
  Administered 2011-02-02: 50 ug via INTRAVENOUS
  Filled 2011-02-02: qty 2

## 2011-02-02 MED ORDER — ONDANSETRON HCL 4 MG/2ML IJ SOLN
INTRAMUSCULAR | Status: DC | PRN
  Administered 2011-02-02: 4 mg via INTRAVENOUS

## 2011-02-02 MED ORDER — ONDANSETRON HCL 4 MG PO TABS: 4.0000 mg | ORAL_TABLET | ORAL | Status: DC | PRN

## 2011-02-02 MED ORDER — ACETAMINOPHEN 10 MG/ML IV SOLN: 1000.0000 mg | Freq: Four times a day (QID) | INTRAVENOUS | Status: DC | PRN

## 2011-02-02 MED ORDER — WITCH HAZEL-GLYCERIN EX PADS: 1.0000 "application " | MEDICATED_PAD | CUTANEOUS | Status: DC | PRN

## 2011-02-02 MED ORDER — SIMETHICONE 80 MG PO CHEW: 80.0000 mg | CHEWABLE_TABLET | ORAL | Status: DC | PRN

## 2011-02-02 MED ORDER — OXYTOCIN 20 UNITS IN LACTATED RINGERS INFUSION - SIMPLE
INTRAVENOUS | Status: DC | PRN
  Administered 2011-02-02: 20 [IU] via INTRAVENOUS

## 2011-02-02 MED ORDER — ZOLPIDEM TARTRATE 5 MG PO TABS: 5.0000 mg | ORAL_TABLET | Freq: Every evening | ORAL | Status: DC | PRN

## 2011-02-02 MED ORDER — FENTANYL CITRATE 0.05 MG/ML IJ SOLN
INTRAMUSCULAR | Status: AC
  Filled 2011-02-02: qty 2

## 2011-02-02 MED ORDER — MORPHINE SULFATE (PF) 0.5 MG/ML IJ SOLN
INTRAMUSCULAR | Status: DC | PRN
  Administered 2011-02-02: 4.9 mg via INTRAVENOUS

## 2011-02-02 MED ORDER — PROMETHAZINE HCL 25 MG/ML IJ SOLN
INTRAMUSCULAR | Status: AC
  Administered 2011-02-02: 12.5 mg via INTRAVENOUS
  Filled 2011-02-02: qty 1

## 2011-02-02 MED ORDER — NALOXONE HCL 0.4 MG/ML IJ SOLN
1.0000 ug/kg/h | INTRAMUSCULAR | Status: DC | PRN
  Filled 2011-02-02: qty 2.5

## 2011-02-02 SURGICAL SUPPLY — 38 items
APL SKNCLS STERI-STRIP NONHPOA (GAUZE/BANDAGES/DRESSINGS) ×1
BENZOIN TINCTURE PRP APPL 2/3 (GAUZE/BANDAGES/DRESSINGS) ×1 IMPLANT
CHLORAPREP W/TINT 26ML (MISCELLANEOUS) ×2 IMPLANT
CLOTH BEACON ORANGE TIMEOUT ST (SAFETY) ×2 IMPLANT
DRAIN JACKSON PRT FLT 10 (DRAIN) ×2 IMPLANT
DRESSING TELFA 8X3 (GAUZE/BANDAGES/DRESSINGS) ×3 IMPLANT
DRSG PAD ABDOMINAL 8X10 ST (GAUZE/BANDAGES/DRESSINGS) ×2 IMPLANT
ELECT REM PT RETURN 9FT ADLT (ELECTROSURGICAL) ×2
ELECTRODE REM PT RTRN 9FT ADLT (ELECTROSURGICAL) ×1 IMPLANT
EXTRACTOR VACUUM M CUP 4 TUBE (SUCTIONS) IMPLANT
GAUZE SPONGE 4X4 12PLY STRL LF (GAUZE/BANDAGES/DRESSINGS) ×4 IMPLANT
GLOVE BIO SURGEON ST LM GN SZ9 (GLOVE) ×1 IMPLANT
GLOVE BIOGEL PI IND STRL 7.0 (GLOVE) ×1 IMPLANT
GLOVE BIOGEL PI INDICATOR 7.0 (GLOVE) ×1
GLOVE ECLIPSE 7.0 STRL STRAW (GLOVE) ×5 IMPLANT
GLOVE INDICATOR STER SZ 9 (GLOVE) ×1 IMPLANT
GOWN PREVENTION PLUS LG XLONG (DISPOSABLE) ×4 IMPLANT
GOWN PREVENTION PLUS XLARGE (GOWN DISPOSABLE) ×2 IMPLANT
GOWN PREVENTION PLUS XXLARGE (GOWN DISPOSABLE) ×1 IMPLANT
KIT ABG SYR 3ML LUER SLIP (SYRINGE) IMPLANT
NDL HYPO 25X5/8 SAFETYGLIDE (NEEDLE) IMPLANT
NEEDLE HYPO 22GX1.5 SAFETY (NEEDLE) ×2 IMPLANT
NEEDLE HYPO 25X5/8 SAFETYGLIDE (NEEDLE) IMPLANT
NS IRRIG 1000ML POUR BTL (IV SOLUTION) ×2 IMPLANT
PACK C SECTION WH (CUSTOM PROCEDURE TRAY) ×2 IMPLANT
PAD ABD 7.5X8 STRL (GAUZE/BANDAGES/DRESSINGS) IMPLANT
RTRCTR C-SECT PINK 25CM LRG (MISCELLANEOUS) IMPLANT
SLEEVE SCD COMPRESS KNEE MED (MISCELLANEOUS) IMPLANT
STAPLER VISISTAT 35W (STAPLE) IMPLANT
SUT VIC AB 0 CTX 36 (SUTURE) ×6
SUT VIC AB 0 CTX36XBRD ANBCTRL (SUTURE) ×3 IMPLANT
SUT VIC AB 2-0 CT1 18 (SUTURE) ×3 IMPLANT
SUT VIC AB 4-0 KS 27 (SUTURE) IMPLANT
SYR 30ML LL (SYRINGE) ×2 IMPLANT
TOWEL OR 17X24 6PK STRL BLUE (TOWEL DISPOSABLE) ×4 IMPLANT
TRAY FOLEY CATH 14FR (SET/KITS/TRAYS/PACK) ×2 IMPLANT
WATER STERILE IRR 1000ML POUR (IV SOLUTION) ×2 IMPLANT
YANKAUER SUCT BULB TIP NO VENT (SUCTIONS) ×2 IMPLANT

## 2011-02-02 NOTE — Anesthesia Preprocedure Evaluation (Signed)
Anesthesia Evaluation  Patient identified by MRN, date of birth, ID band Patient awake    Reviewed: Allergy & Precautions, H&P , NPO status , Patient's Chart, lab work & pertinent test results  Airway Mallampati: III      Dental No notable dental hx.    Pulmonary neg pulmonary ROS,  clear to auscultation  Pulmonary exam normal       Cardiovascular Exercise Tolerance: Good neg cardio ROS regular Normal    Neuro/Psych Negative Neurological ROS  Negative Psych ROS   GI/Hepatic negative GI ROS, Neg liver ROS,   Endo/Other  Negative Endocrine ROS  Renal/GU negative Renal ROS  Genitourinary negative   Musculoskeletal   Abdominal Normal abdominal exam  (+)   Peds  Hematology negative hematology ROS (+)   Anesthesia Other Findings   Reproductive/Obstetrics (+) Pregnancy                           Anesthesia Physical Anesthesia Plan  ASA: II  Anesthesia Plan: Spinal   Post-op Pain Management:    Induction:   Airway Management Planned:   Additional Equipment:   Intra-op Plan:   Post-operative Plan:   Informed Consent: I have reviewed the patients History and Physical, chart, labs and discussed the procedure including the risks, benefits and alternatives for the proposed anesthesia with the patient or authorized representative who has indicated his/her understanding and acceptance.     Plan Discussed with: Anesthesiologist, CRNA and Surgeon  Anesthesia Plan Comments:         Anesthesia Quick Evaluation

## 2011-02-02 NOTE — Op Note (Signed)
Kathrynn Speed PROCEDURE DATE: 02/02/2011  PREOPERATIVE DIAGNOSIS: Intrauterine pregnancy at  [redacted]w[redacted]d weeks gestation; elective repeat  POSTOPERATIVE DIAGNOSIS: The same  PROCEDURE: Repeat Low Transverse Cesarean Section, panniculectomy  SURGEON:  Dr. Tinnie Gens  ASSISTANT: Dr Christin Bach, Dr Candelaria Celeste  INDICATIONS: Debra Hernandez is a 26 y.o. G3P2001 at [redacted]w[redacted]d scheduled for cesarean section secondary to Elective repeat.  The risks of cesarean section discussed with the patient included but were not limited to: bleeding which may require transfusion or reoperation; infection which may require antibiotics; injury to bowel, bladder, ureters or other surrounding organs; injury to the fetus; need for additional procedures including hysterectomy in the event of a life-threatening hemorrhage; placental abnormalities wth subsequent pregnancies, incisional problems, thromboembolic phenomenon and other postoperative/anesthesia complications. The patient concurred with the proposed plan, giving informed written consent for the procedure.    FINDINGS:  Viable female infant in cephalic presentation.  Apgars 9 and 9, weight, 7 pounds and 10 ounces.  Clear amniotic fluid.  Intact placenta, three vessel cord.  Normal uterus, fallopian tubes and ovaries bilaterally.  ANESTHESIA:    Spinal INTRAVENOUS FLUIDS:2800 ml ESTIMATED BLOOD LOSS: 800 ml URINE OUTPUT:  100 ml SPECIMENS: Placenta sent to L&D COMPLICATIONS: None immediate  PROCEDURE IN DETAIL:  The patient received intravenous antibiotics and had sequential compression devices applied to her lower extremities while in the preoperative area.  She was then taken to the operating room where spinal anesthesia was administered and was found to be adequate. She was then placed in a dorsal supine position with a leftward tilt, and prepped and draped in a sterile manner.  A foley catheter was placed into her bladder and attached to constant gravity, which  drained clear fluid throughout.  After an adequate timeout was performed, a Pfannenstiel skin incision was made with scalpel and carried through to the underlying layer of fascia. The fascia was incised in the midline and this incision was extended bilaterally using the Mayo scissors. Kocher clamps were applied to the superior aspect of the fascial incision and the underlying rectus muscles were dissected off bluntly. A similar process was carried out on the inferior aspect of the facial incision. The rectus muscles were separated in the midline bluntly and the peritoneum was entered bluntly. An Alexis retractor was placed into the abdomen.  Attention was turned to the lower uterine segment where a transverse hysterotomy was made with a scalpel and extended bilaterally bluntly. The infant was successfully delivered, and cord was clamped and cut and infant was handed over to awaiting neonatology team. Uterine massage was then administered and the placenta delivered intact with three-vessel cord. The uterus was then cleared of clot and debris.  The hysterotomy was closed with 0 Vicryl in a single running locked fashion. Overall, excellent hemostasis was noted. The abdomen and the pelvis were cleared of all clot and debris.  The fascia was then closed using 0 Vicryl in a running fashion.  The incision was extended laterally to the ASIS bilaterally.  The electrocautery was used to elevate the overlying tissue superiorly.  An elliptical incision was then made and a panniculectomy was performed.  The subcuticular tissue was brought together with 2-0 Vicryl interrupted suture, followed by several horizontal mattress sutures to close the dead space.  2 Jackson-Gerda Yin 10 F drains were placed exiting the skin suprapubically and tied down.  A 3-0 Vicryl on a Keith needle was used to close the skin.  Benzoin and 1/2 inch Steri-strips were placed.  The patient tolerated the procedure well. Sponge, lap, instrument and needle  counts were correct x 2. She was taken to the recovery room in stable condition.    Candelaria Celeste JEHIEL DO 02/02/2011 1:34 PM

## 2011-02-02 NOTE — Transfer of Care (Signed)
Immediate Anesthesia Transfer of Care Note  Patient: Debra Hernandez  Procedure(s) Performed:  CESAREAN SECTION - Repeart c/section; SCAR REVISION  Patient Location: PACU  Anesthesia Type: Spinal  Level of Consciousness: alert  and oriented  Airway & Oxygen Therapy: Patient Spontanous Breathing  Post-op Assessment: Report given to PACU RN and Post -op Vital signs reviewed and stable  Post vital signs: stable  Complications: No apparent anesthesia complications

## 2011-02-02 NOTE — Anesthesia Postprocedure Evaluation (Signed)
Anesthesia Post Note  Patient: Debra Hernandez  Procedure(s) Performed:  CESAREAN SECTION - Repeart c/section; SCAR REVISION  Anesthesia type: Spinal  Patient location: PACU  Post pain: Pain level controlled  Post assessment: Post-op Vital signs reviewed  Last Vitals:  Filed Vitals:   02/02/11 1146  BP: 115/84  Pulse: 91  Temp: 37.1 C  Resp: 18    Post vital signs: Reviewed  Level of consciousness: awake  Complications: No apparent anesthesia complications

## 2011-02-02 NOTE — Anesthesia Procedure Notes (Signed)
Spinal  Patient location during procedure: OR Start time: 02/02/2011 1:06 PM Staffing Anesthesiologist: Brayton Caves R Performed by: anesthesiologist  Preanesthetic Checklist Completed: patient identified, site marked, surgical consent, pre-op evaluation, timeout performed, IV checked, risks and benefits discussed and monitors and equipment checked Spinal Block Patient position: sitting Prep: DuraPrep Patient monitoring: heart rate, cardiac monitor, continuous pulse ox and blood pressure Approach: midline Location: L3-4 Injection technique: single-shot Needle Needle type: Sprotte  Needle gauge: 24 G Needle length: 9 cm Assessment Sensory level: T4 Additional Notes Patient identified.  Risk benefits discussed including failed block, incomplete pain control, headache, nerve damage, paralysis, blood pressure changes, nausea, vomiting, reactions to medication both toxic or allergic, and postpartum back pain.  Patient expressed understanding and wished to proceed.  All questions were answered.  Sterile technique used throughout procedure.  CSF was clear.  No parasthesia or other complications.  Please see nursing notes for vital signs.

## 2011-02-02 NOTE — Addendum Note (Signed)
Addendum  created 02/02/11 1754 by Velna Hatchet, MD   Modules edited:Orders, PRL Based Order Sets

## 2011-02-02 NOTE — Anesthesia Postprocedure Evaluation (Signed)
  Anesthesia Post-op Note  Patient: Debra Hernandez  Procedure(s) Performed:  CESAREAN SECTION - Repeart c/section; SCAR REVISION  Patient Location: PACU and Mother/Baby  Anesthesia Type: Spinal  Level of Consciousness: awake, alert , oriented and confused  Airway and Oxygen Therapy: Patient Spontanous Breathing  Post-op Pain: mild  Post-op Assessment: Post-op Vital signs reviewed, Patient's Cardiovascular Status Stable and Respiratory Function Stable  Post-op Vital Signs: stable  Complications: No apparent anesthesia complications

## 2011-02-02 NOTE — Addendum Note (Signed)
Addendum  created 02/02/11 1732 by Truitt Leep, CRNA   Modules edited:Notes Section

## 2011-02-02 NOTE — H&P (Signed)
  Debra Hernandez is an 26 y.o. G3P2001 [redacted]w[redacted]d female.   Chief Complaint: IUP @ 39 wks, previous C-section, desires repeat HPI: Pt. Is term pregnant and has previous C-section, desires elective repeat.  Also has problems with recurrent skin irritation related to overhanging pannus.   Past Medical History  Diagnosis Date  . Anemia   . Polycystic ovary syndrome   . Melanoma 2003    on back - used local to removed  . History of seasonal allergies   . Blood transfusion 5/11    2 units bld transfused at [facility]  . Kidney stone     passsed stone - no surgery required    Past Surgical History  Procedure Date  . Cesarean section 05/2009    at 39 wks at [facility],  lived only 12 days  . Laporascopy   . Tonsillectomy   . Tonsillectomy and adenoidectomy   . Diagnostic laparoscopy     ovarian cyst removed  . Wisdom tooth extraction     2 lower extracted  . Svd 04/2006    x 1 - at [facility]    Family History  Problem Relation Age of Onset  . Cancer Mother     Ovarian  . Cancer Maternal Grandmother     Ovarian  . Diabetes Maternal Grandfather   . Anesthesia problems Neg Hx   . Hypotension Neg Hx   . Malignant hyperthermia Neg Hx   . Pseudochol deficiency Neg Hx    Social History:  reports that she has never smoked. She has never used smokeless tobacco. She reports that she drinks alcohol. She reports that she does not use illicit drugs.  Allergies:  Allergies  Allergen Reactions  . Strattera (Atomoxetine Hcl) Nausea And Vomiting and Other (See Comments)    insomnia    Medications Prior to Admission  Medication Dose Route Frequency Provider Last Rate Last Dose  . DISCONTD: betamethasone acetate-betamethasone sodium phosphate (CELESTONE) injection 12 mg  12 mg Intramuscular Once Lesly Dukes, MD       Medications Prior to Admission  Medication Sig Dispense Refill  . IRON COMBINATIONS PO Take 1 tablet by mouth daily.           Musculoskeletal:positive for back pain  Last menstrual period 05/05/2010. General appearance: alert and cooperative Eyes: negative findings: conjunctivae and sclerae normal Neck: supple, symmetrical, trachea midline and thyroid not enlarged, symmetric, no tenderness/mass/nodules Lungs: clear to auscultation bilaterally Heart: regular rate and rhythm and S1, S2 normal Abdomen: soft, non-tender; bowel sounds normal; no masses,  no organomegaly Extremities: extremities normal, atraumatic, no cyanosis or edema Skin: Skin color, texture, turgor normal. No rashes or lesions Neurologic: Grossly normal   Lab Results  Component Value Date   WBC 10.5 01/25/2011   HGB 8.1* 01/25/2011   HCT 27.1* 01/25/2011   MCV 79.0 01/25/2011   PLT 131* 01/25/2011   No results found for this basename: PREGTESTUR, PREGSERUM, HCG, HCGQUANT     Assessment/Plan Patient Active Problem List  Diagnoses  . Supervision of other normal pregnancy  . Previous cesarean delivery, antepartum condition or complication  . Fetal hydrops and neonatal death  . Obesity complicating pregnancy  . Sciatic pain   For Repeat C-section and panniculectomy.  PRATT,TANYA S 02/02/2011, 8:22 AM

## 2011-02-03 ENCOUNTER — Encounter (HOSPITAL_COMMUNITY): Payer: Self-pay | Admitting: Family Medicine

## 2011-02-03 LAB — PREPARE RBC (CROSSMATCH)

## 2011-02-03 LAB — CBC
Hemoglobin: 5.6 g/dL — CL (ref 12.0–15.0)
MCH: 23.1 pg — ABNORMAL LOW (ref 26.0–34.0)
MCHC: 29.3 g/dL — ABNORMAL LOW (ref 30.0–36.0)

## 2011-02-03 MED ORDER — ACETAMINOPHEN 325 MG PO TABS
650.0000 mg | ORAL_TABLET | Freq: Once | ORAL | Status: AC
  Administered 2011-02-03: 650 mg via ORAL
  Filled 2011-02-03: qty 2

## 2011-02-03 MED ORDER — ACETAMINOPHEN 325 MG PO TABS
650.0000 mg | ORAL_TABLET | ORAL | Status: DC | PRN
  Administered 2011-02-03: 650 mg via ORAL

## 2011-02-03 MED ORDER — FERROUS SULFATE 325 (65 FE) MG PO TABS
325.0000 mg | ORAL_TABLET | Freq: Two times a day (BID) | ORAL | Status: DC
  Administered 2011-02-03 (×2): 325 mg via ORAL
  Filled 2011-02-03 (×3): qty 1

## 2011-02-03 MED ORDER — SODIUM CHLORIDE 0.9 % IV SOLN
500.0000 mL | Freq: Once | INTRAVENOUS | Status: AC
  Administered 2011-02-03: 250 mL via INTRAVENOUS

## 2011-02-03 MED ORDER — DIPHENHYDRAMINE HCL 50 MG/ML IJ SOLN
25.0000 mg | Freq: Once | INTRAMUSCULAR | Status: AC
  Administered 2011-02-03: 50 mg via INTRAVENOUS
  Filled 2011-02-03: qty 1

## 2011-02-03 NOTE — Progress Notes (Signed)
Patient ID: Debra Hernandez, female   DOB: [birth year], 26 y.o.   MRN: [id number] Called by nurse regarding patient refusing blood transfusion after 2nd unit started to be administered. Went to eval patient, discussed risks and benefits of transfusion. Patient states major concern is being 'stuck again' for IV access. Told patient we would like to try again to finish 2nd unit. Patient agreed, Will attempt IV access again and finish 2nd unit. Will assess 3rd unit at appropriate time. Otherwise, continue routine care.

## 2011-02-03 NOTE — Progress Notes (Signed)
Subjective: Postpartum Day #1: Cesarean Delivery Patient reports tolerating PO; no difficulty with amb; no s/s anemia; breastfeeding; desires Mirena  Objective: Vital signs in last 24 hours: Temp:  [97.7 F (36.5 C)-99.1 F (37.3 C)] 97.9 F (36.6 C) (01/30 0734) Pulse Rate:  [65-121] 100  (01/30 0736) Resp:  [12-28] 18  (01/30 0736) BP: (83-128)/(53-84) 83/57 mmHg (01/30 0736) SpO2:  [98 %-99 %] 99 % (01/30 0500) Weight:  [99.338 kg (219 lb)] 99.338 kg (219 lb) (01/29 1348)  Physical Exam:  General: alert, cooperative and mild distress Lochia: appropriate Uterine Fundus: firm Incision: dsg intact and unstained; JP drains x 2 with scant serosang DVT Evaluation: No evidence of DVT seen on physical exam.   Basename 02/03/11 0520  HGB 5.6*  HCT 19.1*    Assessment/Plan: Status post Cesarean section. Postoperative course complicated by anemia  Orthostatic pulses 66 to 100 with standing but otherwise asymptomatic Will order FESO4 BID and review at rounds for any additional treatment.  Debra Hernandez 02/03/2011, 8:05 AM

## 2011-02-03 NOTE — Progress Notes (Signed)
crCRITICAL VALUE ALERT  Critical value received:  Hgb=5.6  Date of notification:  02/03/11  Time of notification:  0630  Critical value read back:yesyes  Nurse who received alert:  Arlington Calix RN  MD notified (1st page):  Philipp Deputy, CNM  Time of first page:  [phone]  MD notified (2nd page):none  Time of second page:none  Responding MD:  Philipp Deputy, CNM  Time MD responded:  [phone]

## 2011-02-03 NOTE — Progress Notes (Signed)
Patient ID: Debra Hernandez, female   DOB: 18-May-1985, 26 y.o.   MRN: [id number]   Discussed administration of PRBC with pt this morning.   Discussed benefits of receiving blood including: 1.  Increasing oxygen carrying capacity of blood 2.  Replacing blood loss 3.  Increasing clotting ability of blood 4.  May be life-saving  Also discussed risk including:  1.  Fever or chills 2.  Allergic reaction with hives and/or difficulty breathing 3.  Disease transmission (Risk for Hepatitis B less than 1 in 200,00; Hepatitis C less than 1 in 1.9 million; HIV less than 1 in 2.1 million) 4.  Risk of any reaction to transfusion is 2-6%.  Risk of death is 1 in 1.5 million.  Alternatives to receiving blood include treating symptomatic anemia with replacement iron with slower return to normal function.  Pt states understanding of risks/benefits/alternatives and consents to procedure.  Sharen Counter, CNM

## 2011-02-04 LAB — HEMOGLOBIN AND HEMATOCRIT, BLOOD: Hemoglobin: 7.8 g/dL — ABNORMAL LOW (ref 12.0–15.0)

## 2011-02-04 MED ORDER — IBUPROFEN 600 MG PO TABS: 600.0000 mg | ORAL_TABLET | Freq: Four times a day (QID) | ORAL | Status: AC

## 2011-02-04 MED ORDER — OXYCODONE-ACETAMINOPHEN 5-325 MG PO TABS: 1.0000 | ORAL_TABLET | ORAL | Status: AC | PRN

## 2011-02-04 NOTE — Progress Notes (Signed)
AT 2100 Nursed to pt. Vanvranken room c/o of pain and discomfort at IV site ,stating that's she no Longer wish to cont. Blood transfusion  Stated  that she wants IV out  House coverage on unit called to pt. Weiss room to assess status of IV PT. Continues  TO STATES THAT SHE NO LONGER WANT TO CONTINUE IV  D/C IV , DR EICHHORN  NOTIFIED of matter  AT 2120 CAME TO PT. Thuman ROOM  ASSESSED THE status and PT. DECIDE TO CONTINUE WITH THE BLOOD TRANSFUSION , DR ECHHIORN  STATED  WE MUST CONTINUE THE  TRANSFUSION PT. Saur CONSENT TO CONTINUE Remainder  OF UNIT OF PACKED RED BLOOD CELLS IV RESTARTED AND transfusion  PT. ASSESSED   TOLERATED WITHOUT REACTION .

## 2011-02-04 NOTE — Discharge Summary (Signed)
Obstetric Discharge Summary Reason for Admission: cesarean section Prenatal Procedures: NST Intrapartum Procedures: cesarean: low cervical, transverse Postpartum Procedures: Panniculectomy Complications-Operative and Postpartum: Blood loss, Hgb drop to 5.6, 2 units transfused Hemoglobin  Date Value Range Status  02/04/2011 7.8* 12.0-15.0 (g/dL) Final     REPEATED TO VERIFY     POST TRANSFUSION SPECIMEN     HCT  Date Value Range Status  02/04/2011 25.4* 36.0-46.0 (%) Final    Discharge Diagnoses: Term Pregnancy-delivered  Discharge Information: Date: 02/04/2011 Activity: unrestricted Diet: routine Medications: PNV, Ibuprofen and Percocet Condition: stable Instructions: refer to practice specific booklet Discharge to: home Discussed discharge with Dr. Christin Bach.  Discharge okay if Hgb > 7.5.  Hgb is now 7.8. Patient is asymptomatic. Follow-up Information    Follow up with [facility] in 1 week. (Please return to out patient clinic in 1 week for drain removall)          Newborn Data: Live born female  Birth Weight: 7 lb 10.2 oz (3465 g) APGAR: 9, 9  Home with mother.  Arthor Captain 02/04/2011, 9:19 AM

## 2011-02-04 NOTE — Progress Notes (Signed)
Subjective: Postpartum Day 2: Cesarean Delivery Patient reports incisional pain, tolerating PO, + flatus and no problems voiding.  Breast feeding well.   Objective: Vital signs in last 24 hours: Temp:  [97.8 F (36.6 C)-99.2 F (37.3 C)] 98 F (36.7 C) (01/31 0542) Pulse Rate:  [76-97] 78  (01/31 0542) Resp:  [16-20] 18  (01/31 0542) BP: (99-118)/(63-77) 118/76 mmHg (01/31 0542) SpO2:  [95 %-99 %] 95 % (01/30 1501)  Physical Exam:  General: alert, cooperative and no distress Lochia: appropriate Uterine Fundus: firm Incision: healing well, no significant drainage, no dehiscence, no significant erythema, Minimal serosanguinous discharge in JP drains approx. 2 ccs DVT Evaluation: No evidence of DVT seen on physical exam. Negative Homan's sign. Calf/Ankle edema is present.   Basename 02/03/11 0520  HGB 5.6*  HCT 19.1*    Assessment/Plan: Status post Cesarean section. Doing well postoperatively.  Discharge pending Hgb >7.5.  Arthor Captain 02/04/2011, 7:44 AM

## 2011-02-05 NOTE — Progress Notes (Signed)
Patent seen together with Ms. Harris, PA-S. I agree with documentation and plan

## 2011-02-05 NOTE — Discharge Summary (Signed)
Agree with above note.  Debra Hernandez 02/05/2011 5:42 AM

## 2011-02-06 LAB — TYPE AND SCREEN
Unit division: 0
Unit division: 0

## 2011-03-16 ENCOUNTER — Ambulatory Visit (INDEPENDENT_AMBULATORY_CARE_PROVIDER_SITE_OTHER): Payer: Managed Care, Other (non HMO) | Admitting: Family Medicine

## 2011-03-16 ENCOUNTER — Encounter: Payer: Self-pay | Admitting: Family Medicine

## 2011-03-16 NOTE — Patient Instructions (Signed)
  Place postpartum visit patient instructions here.  

## 2011-03-16 NOTE — Progress Notes (Signed)
  Subjective:     Debra Hernandez is a 26 y.o. female who presents for a postpartum visit. She is 6 weeks postpartum following a low cervical transverse Cesarean section. I have fully reviewed the prenatal and intrapartum course. The delivery was at 39 gestational weeks. Outcome: repeat cesarean section, low transverse incision. Anesthesia: spinal. Postpartum course has been normal. Baby's course has been unremarkable. Baby is feeding by both breast and bottle - Enfamil Prosobee. Bleeding no bleeding. Bowel function is normal. Bladder function is normal. Patient is not sexually active. Contraception method is none. Postpartum depression screening: negative.  The following portions of the patient's history were reviewed and updated as appropriate: allergies, current medications, past family history, past medical history, past social history, past surgical history and problem list.  Review of Systems A comprehensive review of systems was negative.   Objective:    BP 117/67  Pulse 80  Ht 5\' 7"  (1.702 m)  Wt 198 lb (89.812 kg)  BMI 31.01 kg/m2  Breastfeeding? Yes  General:  alert, cooperative and appears stated age           Abdomen: soft, non-tender; bowel sounds normal; no masses,  no organomegaly   Vulva:  normal  Vagina: normal vagina  Cervix:  anteverted and no cervical motion tenderness  Corpus: normal size, contour, position, consistency, mobility, non-tender  Adnexa:  normal adnexa           Assessment:    Normal postpartum exam. Pap smear not done at today's visit.   Plan:    1. Contraception: IUD 2. Return tomorrow for insertion 3. Follow up in: 1 day or as needed.

## 2011-03-16 NOTE — Progress Notes (Signed)
Patient is here for her post partum check.  She will follow up tomorrow for her IUD.

## 2011-03-17 ENCOUNTER — Ambulatory Visit (INDEPENDENT_AMBULATORY_CARE_PROVIDER_SITE_OTHER): Payer: Managed Care, Other (non HMO) | Admitting: Obstetrics and Gynecology

## 2011-03-17 ENCOUNTER — Encounter: Payer: Self-pay | Admitting: Obstetrics and Gynecology

## 2011-03-17 VITALS — BP 117/67 | HR 80 | Ht 67.0 in | Wt 198.0 lb

## 2011-03-17 DIAGNOSIS — Z30430 Encounter for insertion of intrauterine contraceptive device: Secondary | ICD-10-CM

## 2011-03-17 DIAGNOSIS — Z01812 Encounter for preprocedural laboratory examination: Secondary | ICD-10-CM

## 2011-03-17 LAB — POCT URINE PREGNANCY: Preg Test, Ur: NEGATIVE

## 2011-03-17 NOTE — Progress Notes (Signed)
Addended by: Vinnie Langton C on: 03/17/2011 10:14 AM   Modules accepted: Orders

## 2011-03-17 NOTE — Progress Notes (Signed)
26 yo G3P2 s/p repeat LTCS 6 weeks ago presenting today for IUD insertion.   IUD Procedure Note Patient identified, informed consent performed, signed copy in chart, time out was performed.  Urine pregnancy test negative.  Speculum placed in the vagina.  Cervix visualized.  Cleaned with Betadine x 2.  Grasped anteriorly with a single tooth tenaculum.  Uterus sounded to 7.5 cm.  Mirena IUD placed per manufacturer's recommendations.  Strings trimmed to 3 cm. Tenaculum was removed, good hemostasis noted.  Patient tolerated procedure well.   Patient given post procedure instructions and Mirena care card with expiration date.  Patient is asked to check IUD strings periodically and follow up in 4-6 weeks for IUD check.

## 2011-04-13 ENCOUNTER — Encounter: Payer: Self-pay | Admitting: Family Medicine

## 2011-04-13 ENCOUNTER — Ambulatory Visit (INDEPENDENT_AMBULATORY_CARE_PROVIDER_SITE_OTHER): Payer: Managed Care, Other (non HMO) | Admitting: Family Medicine

## 2011-04-13 VITALS — BP 106/76 | HR 78 | Ht 67.5 in | Wt 202.0 lb

## 2011-04-13 DIAGNOSIS — N920 Excessive and frequent menstruation with regular cycle: Secondary | ICD-10-CM

## 2011-04-13 MED ORDER — NORETHIN-ETH ESTRADIOL-FE 0.4-35 MG-MCG PO CHEW: 1.0000 | CHEWABLE_TABLET | Freq: Every day | ORAL | Status: DC

## 2011-04-13 NOTE — Patient Instructions (Signed)

## 2011-04-13 NOTE — Progress Notes (Signed)
Patient is having continuous bleeding since IUD was inserted. She is concerned about anemia as she has had this in the past.  She is currently taking a multi vitamin and an extra iron supplement from delivery.  The bleeding will slow up then start back and she will bleed very heavy mid day through evening when it lightens up, not much in the morning.  She is taking ocp to try and get this under control but it is not helping so far and it has been three weeks.

## 2011-04-13 NOTE — Progress Notes (Signed)
  Subjective:    Patient ID: Debra Hernandez, female    DOB: 07-16-1985, 26 y.o.   MRN: [id number]  HPI She is 3 wks s/p IUD insertion.  She is using loloestrin to try to curb bleeding.  Reports 4 lb weight gain.  She is taking 2 OC's daily and reports continued bleeding.   Review of Systems  Constitutional: Negative for fever and chills.  Gastrointestinal: Negative for abdominal pain.  Genitourinary: Negative for pelvic pain.  Musculoskeletal: Negative for arthralgias.       Objective:   Physical Exam  Vitals reviewed. Constitutional: She appears well-developed and well-nourished.  HENT:  Head: Normocephalic and atraumatic.  Eyes: No scleral icterus.  Cardiovascular: Normal rate.   Pulmonary/Chest: Effort normal.  Abdominal: Soft.  Genitourinary: Vagina normal and uterus normal.       IUD strings are easily visible.          Assessment & Plan:   1. Excessive or frequent menstruation  CBC, Norethin-Eth Estradiol-Fe [facility] FE,WYMZYA FE,ZENCHENT FE,ZEOSA) 0.4-35 MG-MCG tablet   Stop LO-Loestrin.

## 2011-05-04 ENCOUNTER — Other Ambulatory Visit (INDEPENDENT_AMBULATORY_CARE_PROVIDER_SITE_OTHER): Payer: Managed Care, Other (non HMO) | Admitting: *Deleted

## 2011-05-04 DIAGNOSIS — D649 Anemia, unspecified: Secondary | ICD-10-CM

## 2011-05-04 NOTE — Progress Notes (Signed)
Patient is here today to get CBC that she forgot to get at her last visit.  She left before it was drawn...  She would also like her thyroid and b12 levels checked due to history and family history.

## 2011-05-05 LAB — CBC WITH DIFFERENTIAL/PLATELET
Basophils Relative: 1 % (ref 0–1)
Hemoglobin: 11.4 g/dL — ABNORMAL LOW (ref 12.0–15.0)
MCHC: 29.7 g/dL — ABNORMAL LOW (ref 30.0–36.0)
Monocytes Relative: 8 % (ref 3–12)
Neutro Abs: 3.2 10*3/uL (ref 1.7–7.7)
Neutrophils Relative %: 49 % (ref 43–77)
Platelets: 238 10*3/uL (ref 150–400)
RBC: 4.43 MIL/uL (ref 3.87–5.11)

## 2011-05-05 LAB — VITAMIN B12: Vitamin B-12: 276 pg/mL (ref 211–911)

## 2011-05-05 LAB — TSH: TSH: 0.378 u[IU]/mL (ref 0.350–4.500)

## 2011-07-15 ENCOUNTER — Ambulatory Visit: Payer: Managed Care, Other (non HMO) | Admitting: Obstetrics & Gynecology

## 2011-09-07 ENCOUNTER — Ambulatory Visit (INDEPENDENT_AMBULATORY_CARE_PROVIDER_SITE_OTHER): Payer: Managed Care, Other (non HMO) | Admitting: Obstetrics & Gynecology

## 2011-09-07 ENCOUNTER — Encounter: Payer: Self-pay | Admitting: Obstetrics & Gynecology

## 2011-09-07 VITALS — BP 117/78 | HR 70 | Ht 68.0 in | Wt 168.0 lb

## 2011-09-07 DIAGNOSIS — N949 Unspecified condition associated with female genital organs and menstrual cycle: Secondary | ICD-10-CM

## 2011-09-07 DIAGNOSIS — N938 Other specified abnormal uterine and vaginal bleeding: Secondary | ICD-10-CM

## 2011-09-07 NOTE — Progress Notes (Signed)
  Subjective:    Patient ID: Debra Hernandez, female    DOB: 1985-02-20, 26 y.o.   MRN: [id number]  HPI  Debra Hernandez is a 27 yo P3 who is here with the complaint of DUB. She says "Take it all out". I have explained that a hysterectomy is not the next step in evaluating/treating this problem. She has the Mirena since her 6 week PP visit this Spring and she has had DUB since. She has tried supplemental OCPs without success. We have discussed alternative forms of birth control but she says that she cannot remember to take OCPs daily, does not want to restart depo provera. She is considering a BTL as she is sure that she does not want more children.  Review of Systems     Objective:   Physical Exam  Mirena strings visible. No ectropion or abnormal discharge seen.      Assessment & Plan:  DUB- most likely due to the Mirena. She and I have discussed this. She is not sure what she wants to do as of today. Strong FH of ovarian/breast ca- she will get the BRCA drawn today.

## 2011-09-07 NOTE — Progress Notes (Signed)
Patient is here for abnormal bleeding.  She has the IUD which has not helped and was also placed on birth control pills which did not make a difference either.  She starts bleeding everytime she has intercourse.

## 2011-09-07 NOTE — Addendum Note (Signed)
Addended by: Barbara Cower on: 09/07/2011 09:54 AM   Modules accepted: Orders

## 2011-09-08 LAB — CBC WITH DIFFERENTIAL
Basophils Absolute: 0 10*3/uL (ref 0.0–0.2)
Basos: 0 % (ref 0–3)
Eosinophils Absolute: 0.1 10*3/uL (ref 0.0–0.4)
HCT: 40.7 % (ref 34.0–46.6)
Hemoglobin: 12.6 g/dL (ref 11.1–15.9)
Lymphocytes Absolute: 1.9 10*3/uL (ref 0.7–4.5)
MCH: 28.1 pg (ref 26.6–33.0)
MCHC: 31 g/dL — ABNORMAL LOW (ref 31.5–35.7)
Monocytes Absolute: 0.5 10*3/uL (ref 0.1–1.0)
Neutrophils Absolute: 3.7 10*3/uL (ref 1.8–7.8)
RDW: 14.7 % (ref 12.3–15.4)

## 2011-09-08 LAB — THYROID PANEL WITH TSH: TSH: 0.899 u[IU]/mL (ref 0.450–4.500)

## 2011-11-01 ENCOUNTER — Ambulatory Visit: Payer: Managed Care, Other (non HMO) | Admitting: Family Medicine

## 2012-01-25 ENCOUNTER — Ambulatory Visit (INDEPENDENT_AMBULATORY_CARE_PROVIDER_SITE_OTHER): Payer: Managed Care, Other (non HMO) | Admitting: Family Medicine

## 2012-01-25 ENCOUNTER — Encounter: Payer: Self-pay | Admitting: Family Medicine

## 2012-01-25 VITALS — BP 110/68 | HR 73 | Ht 68.0 in | Wt 159.0 lb

## 2012-01-25 DIAGNOSIS — K649 Unspecified hemorrhoids: Secondary | ICD-10-CM | POA: Insufficient documentation

## 2012-01-25 MED ORDER — PRAMOXINE HCL 1 % RE FOAM: RECTAL | Status: DC | PRN

## 2012-01-25 NOTE — Patient Instructions (Signed)

## 2012-01-25 NOTE — Assessment & Plan Note (Signed)
Not a skin tag, but a small hemorrhoid.  No indication for removal.  Topical treatment.  Do not strain with BM's.

## 2012-01-25 NOTE — Progress Notes (Signed)
  Subjective:    Patient ID: Debra Hernandez, female    DOB: 1985-07-07, 27 y.o.   MRN: [id number]  HPI  Reports skin tag since birth of son.  No real problem except it becomes irritated from time to time.  She would like it removed for cosmetic reasons.  Review of Systems  Gastrointestinal: Negative for nausea, vomiting and constipation.  Genitourinary: Negative for dysuria and menstrual problem.       Objective:   Physical Exam  Vitals reviewed. Constitutional: She is oriented to person, place, and time. She appears well-developed and well-nourished.  HENT:  Head: Normocephalic and atraumatic.  Eyes: No scleral icterus.  Neck: Neck supple.  Cardiovascular: Normal rate.   Pulmonary/Chest: Effort normal.  Abdominal: Soft. There is no tenderness.  Genitourinary: Rectal exam shows external hemorrhoid (small and non-thrombosed).  Neurological: She is alert and oriented to person, place, and time.          Assessment & Plan:

## 2012-02-29 ENCOUNTER — Telehealth: Payer: Self-pay | Admitting: *Deleted

## 2012-02-29 MED ORDER — VALACYCLOVIR HCL 1 G PO TABS: 1000.0000 mg | ORAL_TABLET | Freq: Two times a day (BID) | ORAL | Status: DC

## 2012-02-29 NOTE — Telephone Encounter (Signed)
Patient needs refill of Valtrex for fever blisters.

## 2012-05-11 ENCOUNTER — Other Ambulatory Visit: Payer: Self-pay | Admitting: Obstetrics & Gynecology

## 2012-05-11 ENCOUNTER — Ambulatory Visit (INDEPENDENT_AMBULATORY_CARE_PROVIDER_SITE_OTHER): Payer: Managed Care, Other (non HMO) | Admitting: Obstetrics & Gynecology

## 2012-05-11 ENCOUNTER — Encounter: Payer: Self-pay | Admitting: Obstetrics & Gynecology

## 2012-05-11 VITALS — BP 100/73 | HR 101 | Ht 68.0 in | Wt 161.0 lb

## 2012-05-11 DIAGNOSIS — Z01419 Encounter for gynecological examination (general) (routine) without abnormal findings: Secondary | ICD-10-CM

## 2012-05-11 DIAGNOSIS — Z113 Encounter for screening for infections with a predominantly sexual mode of transmission: Secondary | ICD-10-CM

## 2012-05-11 DIAGNOSIS — Z124 Encounter for screening for malignant neoplasm of cervix: Secondary | ICD-10-CM

## 2012-05-11 DIAGNOSIS — R109 Unspecified abdominal pain: Secondary | ICD-10-CM

## 2012-05-11 MED ORDER — VALACYCLOVIR HCL 1 G PO TABS: 1000.0000 mg | ORAL_TABLET | Freq: Two times a day (BID) | ORAL | Status: DC

## 2012-05-11 NOTE — Progress Notes (Signed)
  Subjective:     Debra Hernandez is a 27 y.o. [phone] female and is here for a comprehensive physical gynecologic exam. The patient reports constant lower abdominal pain; feels like she has an ovarian cyst.  No irregular bleeding.   Also requesting refill of Valtrex for fever blisters.  History   Social History  . Marital Status: Married    Spouse Name: N/A    Number of Children: N/A  . Years of Education: N/A   Occupational History  . Not on file.   Social History Main Topics  . Smoking status: Never Smoker   . Smokeless tobacco: Never Used  . Alcohol Use: Yes     Comment: socially but none with pregnancy  . Drug Use: No  . Sexually Active: Yes -- Female partner(s)    Birth Control/ Protection: IUD     Comment: pregnant   Other Topics Concern  . Not on file   Social History Narrative  . No narrative on file   Health Maintenance  Topic Date Due  . Pap Smear  10/28/2004  . Tetanus/tdap  10/28/2005  . Influenza Vaccine  09/04/2012    The following portions of the patient's history were reviewed and updated as appropriate: allergies, current medications, past family history, past medical history, past social history, past surgical history and problem list.  Review of Systems Pertinent items are noted in HPI.   Objective:   BP 100/73  Pulse 101  Ht 5\' 8"  (1.727 m)  Wt 161 lb (73.029 kg)  BMI 24.49 kg/m2  Breastfeeding? No GENERAL: Well-developed, well-nourished female in no acute distress.  HEENT: Normocephalic, atraumatic. Sclerae anicteric.  NECK: Supple. Normal thyroid.  LUNGS: Clear to auscultation bilaterally.  HEART: Regular rate and rhythm. BREASTS: Symmetric in size. No masses, skin changes, nipple drainage, or lymphadenopathy. ABDOMEN: Soft, nondistended, diffuse lower abdominal tenderness, no rebound or guarding. No organomegaly. PELVIC: Normal external female genitalia. Vagina is pink and rugated.  Normal discharge. Normal cervix contour; IUD strings  seen. Pap smear obtained. Uterus is normal in size. No adnexal mass or tenderness. Tender on palpation of fundus. EXTREMITIES: No cyanosis, clubbing, or edema, 2+ distal pulses.   Assessment:    Healthy female exam.  Lower abdominal pain   Plan:   Pap done, will follow up results and manage accordingly. Pelvic ultrasound scheduled, will follow up results and manage accordingly. Valtrex refilled Routine preventative health maintenance measures emphasized

## 2012-05-11 NOTE — Patient Instructions (Signed)
Preventive Care for Adults, Female A healthy lifestyle and preventive care can promote health and wellness. Preventive health guidelines for women include the following key practices.  A routine yearly physical is a good way to check with your caregiver about your health and preventive screening. It is a chance to share any concerns and updates on your health, and to receive a thorough exam.  Visit your dentist for a routine exam and preventive care every 6 months. Brush your teeth twice a day and floss once a day. Good oral hygiene prevents tooth decay and gum disease.  The frequency of eye exams is based on your age, health, family medical history, use of contact lenses, and other factors. Follow your caregiver's recommendations for frequency of eye exams.  Eat a healthy diet. Foods like vegetables, fruits, whole grains, low-fat dairy products, and lean protein foods contain the nutrients you need without too many calories. Decrease your intake of foods high in solid fats, added sugars, and salt. Eat the right amount of calories for you.Get information about a proper diet from your caregiver, if necessary.  Regular physical exercise is one of the most important things you can do for your health. Most adults should get at least 150 minutes of moderate-intensity exercise (any activity that increases your heart rate and causes you to sweat) each week. In addition, most adults need muscle-strengthening exercises on 2 or more days a week.  Maintain a healthy weight. The body mass index (BMI) is a screening tool to identify possible weight problems. It provides an estimate of body fat based on height and weight. Your caregiver can help determine your BMI, and can help you achieve or maintain a healthy weight.For adults 20 years and older:  A BMI below 18.5 is considered underweight.  A BMI of 18.5 to 24.9 is normal.  A BMI of 25 to 29.9 is considered overweight.  A BMI of 30 and above is  considered obese.  Maintain normal blood lipids and cholesterol levels by exercising and minimizing your intake of saturated fat. Eat a balanced diet with plenty of fruit and vegetables. Blood tests for lipids and cholesterol should begin at age 20 and be repeated every 5 years. If your lipid or cholesterol levels are high, you are over 50, or you are at high risk for heart disease, you may need your cholesterol levels checked more frequently.Ongoing high lipid and cholesterol levels should be treated with medicines if diet and exercise are not effective.  If you smoke, find out from your caregiver how to quit. If you do not use tobacco, do not start.  If you are pregnant, do not drink alcohol. If you are breastfeeding, be very cautious about drinking alcohol. If you are not pregnant and choose to drink alcohol, do not exceed 1 drink per day. One drink is considered to be 12 ounces (355 mL) of beer, 5 ounces (148 mL) of wine, or 1.5 ounces (44 mL) of liquor.  Avoid use of street drugs. Do not share needles with anyone. Ask for help if you need support or instructions about stopping the use of drugs.  High blood pressure causes heart disease and increases the risk of stroke. Your blood pressure should be checked at least every 1 to 2 years. Ongoing high blood pressure should be treated with medicines if weight loss and exercise are not effective.  If you are 55 to 26 years old, ask your caregiver if you should take aspirin to prevent strokes.  Diabetes   screening involves taking a blood sample to check your fasting blood sugar level. This should be done once every 3 years, after age 45, if you are within normal weight and without risk factors for diabetes. Testing should be considered at a younger age or be carried out more frequently if you are overweight and have at least 1 risk factor for diabetes.  Breast cancer screening is essential preventive care for women. You should practice "breast  self-awareness." This means understanding the normal appearance and feel of your breasts and may include breast self-examination. Any changes detected, no matter how small, should be reported to a caregiver. Women in their 20s and 30s should have a clinical breast exam (CBE) by a caregiver as part of a regular health exam every 1 to 3 years. After age 40, women should have a CBE every year. Starting at age 40, women should consider having a mammography (breast X-ray test) every year. Women who have a family history of breast cancer should talk to their caregiver about genetic screening. Women at a high risk of breast cancer should talk to their caregivers about having magnetic resonance imaging (MRI) and a mammography every year.  The Pap test is a screening test for cervical cancer. A Pap test can show cell changes on the cervix that might become cervical cancer if left untreated. A Pap test is a procedure in which cells are obtained and examined from the lower end of the uterus (cervix).  Women should have a Pap test starting at age 21.  Between ages 21 and 29, Pap tests should be repeated every 2 years.  Beginning at age 30, you should have a Pap test every 3 years as long as the past 3 Pap tests have been normal.  Some women have medical problems that increase the chance of getting cervical cancer. Talk to your caregiver about these problems. It is especially important to talk to your caregiver if a new problem develops soon after your last Pap test. In these cases, your caregiver may recommend more frequent screening and Pap tests.  The above recommendations are the same for women who have or have not gotten the vaccine for human papillomavirus (HPV).  If you had a hysterectomy for a problem that was not cancer or a condition that could lead to cancer, then you no longer need Pap tests. Even if you no longer need a Pap test, a regular exam is a good idea to make sure no other problems are  starting.  If you are between ages 65 and 70, and you have had normal Pap tests going back 10 years, you no longer need Pap tests. Even if you no longer need a Pap test, a regular exam is a good idea to make sure no other problems are starting.  If you have had past treatment for cervical cancer or a condition that could lead to cancer, you need Pap tests and screening for cancer for at least 20 years after your treatment.  If Pap tests have been discontinued, risk factors (such as a new sexual partner) need to be reassessed to determine if screening should be resumed.  The HPV test is an additional test that may be used for cervical cancer screening. The HPV test looks for the virus that can cause the cell changes on the cervix. The cells collected during the Pap test can be tested for HPV. The HPV test could be used to screen women aged 30 years and older, and should   be used in women of any age who have unclear Pap test results. After the age of 30, women should have HPV testing at the same frequency as a Pap test.  Colorectal cancer can be detected and often prevented. Most routine colorectal cancer screening begins at the age of 50 and continues through age 75. However, your caregiver may recommend screening at an earlier age if you have risk factors for colon cancer. On a yearly basis, your caregiver may provide home test kits to check for hidden blood in the stool. Use of a small camera at the end of a tube, to directly examine the colon (sigmoidoscopy or colonoscopy), can detect the earliest forms of colorectal cancer. Talk to your caregiver about this at age 50, when routine screening begins. Direct examination of the colon should be repeated every 5 to 10 years through age 75, unless early forms of pre-cancerous polyps or small growths are found.  Hepatitis C blood testing is recommended for all people born from 1945 through 1965 and any individual with known risks for hepatitis C.  Practice  safe sex. Use condoms and avoid high-risk sexual practices to reduce the spread of sexually transmitted infections (STIs). STIs include gonorrhea, chlamydia, syphilis, trichomonas, herpes, HPV, and human immunodeficiency virus (HIV). Herpes, HIV, and HPV are viral illnesses that have no cure. They can result in disability, cancer, and death. Sexually active women aged 25 and younger should be checked for chlamydia. Older women with new or multiple partners should also be tested for chlamydia. Testing for other STIs is recommended if you are sexually active and at increased risk.  Osteoporosis is a disease in which the bones lose minerals and strength with aging. This can result in serious bone fractures. The risk of osteoporosis can be identified using a bone density scan. Women ages 65 and over and women at risk for fractures or osteoporosis should discuss screening with their caregivers. Ask your caregiver whether you should take a calcium supplement or vitamin D to reduce the rate of osteoporosis.  Menopause can be associated with physical symptoms and risks. Hormone replacement therapy is available to decrease symptoms and risks. You should talk to your caregiver about whether hormone replacement therapy is right for you.  Use sunscreen with sun protection factor (SPF) of 30 or more. Apply sunscreen liberally and repeatedly throughout the day. You should seek shade when your shadow is shorter than you. Protect yourself by wearing long sleeves, pants, a wide-brimmed hat, and sunglasses year round, whenever you are outdoors.  Once a month, do a whole body skin exam, using a mirror to look at the skin on your back. Notify your caregiver of new moles, moles that have irregular borders, moles that are larger than a pencil eraser, or moles that have changed in shape or color.  Stay current with required immunizations.  Influenza. You need a dose every fall (or winter). The composition of the flu vaccine  changes each year, so being vaccinated once is not enough.  Pneumococcal polysaccharide. You need 1 to 2 doses if you smoke cigarettes or if you have certain chronic medical conditions. You need 1 dose at age 65 (or older) if you have never been vaccinated.  Tetanus, diphtheria, pertussis (Tdap, Td). Get 1 dose of Tdap vaccine if you are younger than age 65, are over 65 and have contact with an infant, are a healthcare worker, are pregnant, or simply want to be protected from whooping cough. After that, you need a Td   booster dose every 10 years. Consult your caregiver if you have not had at least 3 tetanus and diphtheria-containing shots sometime in your life or have a deep or dirty wound.  HPV. You need this vaccine if you are a woman age 26 or younger. The vaccine is given in 3 doses over 6 months.  Measles, mumps, rubella (MMR). You need at least 1 dose of MMR if you were born in 1957 or later. You may also need a second dose.  Meningococcal. If you are age 19 to 21 and a first-year college student living in a residence hall, or have one of several medical conditions, you need to get vaccinated against meningococcal disease. You may also need additional booster doses.  Zoster (shingles). If you are age 60 or older, you should get this vaccine.  Varicella (chickenpox). If you have never had chickenpox or you were vaccinated but received only 1 dose, talk to your caregiver to find out if you need this vaccine.  Hepatitis A. You need this vaccine if you have a specific risk factor for hepatitis A virus infection or you simply wish to be protected from this disease. The vaccine is usually given as 2 doses, 6 to 18 months apart.  Hepatitis B. You need this vaccine if you have a specific risk factor for hepatitis B virus infection or you simply wish to be protected from this disease. The vaccine is given in 3 doses, usually over 6 months. Preventive Services / Frequency Ages 19 to 39  Blood  pressure check.** / Every 1 to 2 years.  Lipid and cholesterol check.** / Every 5 years beginning at age 20.  Clinical breast exam.** / Every 3 years for women in their 20s and 30s.  Pap test.** / Every 2 years from ages 21 through 29. Every 3 years starting at age 30 through age 65 or 70 with a history of 3 consecutive normal Pap tests.  HPV screening.** / Every 3 years from ages 30 through ages 65 to 70 with a history of 3 consecutive normal Pap tests.  Hepatitis C blood test.** / For any individual with known risks for hepatitis C.  Skin self-exam. / Monthly.  Influenza immunization.** / Every year.  Pneumococcal polysaccharide immunization.** / 1 to 2 doses if you smoke cigarettes or if you have certain chronic medical conditions.  Tetanus, diphtheria, pertussis (Tdap, Td) immunization. / A one-time dose of Tdap vaccine. After that, you need a Td booster dose every 10 years.  HPV immunization. / 3 doses over 6 months, if you are 26 and younger.  Measles, mumps, rubella (MMR) immunization. / You need at least 1 dose of MMR if you were born in 1957 or later. You may also need a second dose.  Meningococcal immunization. / 1 dose if you are age 19 to 21 and a first-year college student living in a residence hall, or have one of several medical conditions, you need to get vaccinated against meningococcal disease. You may also need additional booster doses.  Varicella immunization.** / Consult your caregiver.  Hepatitis A immunization.** / Consult your caregiver. 2 doses, 6 to 18 months apart.  Hepatitis B immunization.** / Consult your caregiver. 3 doses usually over 6 months. Ages 40 to 64  Blood pressure check.** / Every 1 to 2 years.  Lipid and cholesterol check.** / Every 5 years beginning at age 20.  Clinical breast exam.** / Every year after age 40.  Mammogram.** / Every year beginning at age 40   and continuing for as long as you are in good health. Consult with your  caregiver.  Pap test.** / Every 3 years starting at age 30 through age 65 or 70 with a history of 3 consecutive normal Pap tests.  HPV screening.** / Every 3 years from ages 30 through ages 65 to 70 with a history of 3 consecutive normal Pap tests.  Fecal occult blood test (FOBT) of stool. / Every year beginning at age 50 and continuing until age 75. You may not need to do this test if you get a colonoscopy every 10 years.  Flexible sigmoidoscopy or colonoscopy.** / Every 5 years for a flexible sigmoidoscopy or every 10 years for a colonoscopy beginning at age 50 and continuing until age 75.  Hepatitis C blood test.** / For all people born from 1945 through 1965 and any individual with known risks for hepatitis C.  Skin self-exam. / Monthly.  Influenza immunization.** / Every year.  Pneumococcal polysaccharide immunization.** / 1 to 2 doses if you smoke cigarettes or if you have certain chronic medical conditions.  Tetanus, diphtheria, pertussis (Tdap, Td) immunization.** / A one-time dose of Tdap vaccine. After that, you need a Td booster dose every 10 years.  Measles, mumps, rubella (MMR) immunization. / You need at least 1 dose of MMR if you were born in 1957 or later. You may also need a second dose.  Varicella immunization.** / Consult your caregiver.  Meningococcal immunization.** / Consult your caregiver.  Hepatitis A immunization.** / Consult your caregiver. 2 doses, 6 to 18 months apart.  Hepatitis B immunization.** / Consult your caregiver. 3 doses, usually over 6 months. Ages 65 and over  Blood pressure check.** / Every 1 to 2 years.  Lipid and cholesterol check.** / Every 5 years beginning at age 20.  Clinical breast exam.** / Every year after age 40.  Mammogram.** / Every year beginning at age 40 and continuing for as long as you are in good health. Consult with your caregiver.  Pap test.** / Every 3 years starting at age 30 through age 65 or 70 with a 3  consecutive normal Pap tests. Testing can be stopped between 65 and 70 with 3 consecutive normal Pap tests and no abnormal Pap or HPV tests in the past 10 years.  HPV screening.** / Every 3 years from ages 30 through ages 65 or 70 with a history of 3 consecutive normal Pap tests. Testing can be stopped between 65 and 70 with 3 consecutive normal Pap tests and no abnormal Pap or HPV tests in the past 10 years.  Fecal occult blood test (FOBT) of stool. / Every year beginning at age 50 and continuing until age 75. You may not need to do this test if you get a colonoscopy every 10 years.  Flexible sigmoidoscopy or colonoscopy.** / Every 5 years for a flexible sigmoidoscopy or every 10 years for a colonoscopy beginning at age 50 and continuing until age 75.  Hepatitis C blood test.** / For all people born from 1945 through 1965 and any individual with known risks for hepatitis C.  Osteoporosis screening.** / A one-time screening for women ages 65 and over and women at risk for fractures or osteoporosis.  Skin self-exam. / Monthly.  Influenza immunization.** / Every year.  Pneumococcal polysaccharide immunization.** / 1 dose at age 65 (or older) if you have never been vaccinated.  Tetanus, diphtheria, pertussis (Tdap, Td) immunization. / A one-time dose of Tdap vaccine if you are over   65 and have contact with an infant, are a healthcare worker, or simply want to be protected from whooping cough. After that, you need a Td booster dose every 10 years.  Varicella immunization.** / Consult your caregiver.  Meningococcal immunization.** / Consult your caregiver.  Hepatitis A immunization.** / Consult your caregiver. 2 doses, 6 to 18 months apart.  Hepatitis B immunization.** / Check with your caregiver. 3 doses, usually over 6 months. ** Family history and personal history of risk and conditions may change your caregiver's recommendations. Document Released: 02/16/2001 Document Revised: 03/15/2011  Document Reviewed: 05/18/2010 [employer] Patient Information 2013 [employer].  

## 2012-05-12 ENCOUNTER — Ambulatory Visit (HOSPITAL_COMMUNITY)
Admission: RE | Admit: 2012-05-12 | Discharge: 2012-05-12 | Disposition: A | Discharge status: Home / Self Care | Payer: Managed Care, Other (non HMO) | Source: Ambulatory Visit | Attending: Obstetrics & Gynecology | Admitting: Obstetrics & Gynecology

## 2012-05-12 DIAGNOSIS — N83209 Unspecified ovarian cyst, unspecified side: Secondary | ICD-10-CM | POA: Insufficient documentation

## 2012-05-12 DIAGNOSIS — N949 Unspecified condition associated with female genital organs and menstrual cycle: Secondary | ICD-10-CM | POA: Insufficient documentation

## 2012-05-12 DIAGNOSIS — R109 Unspecified abdominal pain: Secondary | ICD-10-CM | POA: Insufficient documentation

## 2012-05-12 DIAGNOSIS — Z30431 Encounter for routine checking of intrauterine contraceptive device: Secondary | ICD-10-CM | POA: Insufficient documentation

## 2012-05-18 LAB — PAP LB, CT-NG, RFX HPV ASCU

## 2012-05-19 ENCOUNTER — Ambulatory Visit (INDEPENDENT_AMBULATORY_CARE_PROVIDER_SITE_OTHER): Payer: Managed Care, Other (non HMO) | Admitting: Obstetrics & Gynecology

## 2012-05-19 ENCOUNTER — Encounter: Payer: Self-pay | Admitting: Obstetrics & Gynecology

## 2012-05-19 VITALS — BP 106/67 | HR 77 | Wt 160.0 lb

## 2012-05-19 DIAGNOSIS — E282 Polycystic ovarian syndrome: Secondary | ICD-10-CM

## 2012-05-19 DIAGNOSIS — R102 Pelvic and perineal pain: Secondary | ICD-10-CM

## 2012-05-19 DIAGNOSIS — N920 Excessive and frequent menstruation with regular cycle: Secondary | ICD-10-CM

## 2012-05-19 DIAGNOSIS — Z8041 Family history of malignant neoplasm of ovary: Secondary | ICD-10-CM

## 2012-05-19 DIAGNOSIS — N949 Unspecified condition associated with female genital organs and menstrual cycle: Secondary | ICD-10-CM

## 2012-05-19 MED ORDER — IBUPROFEN 600 MG PO TABS: 600.0000 mg | ORAL_TABLET | Freq: Four times a day (QID) | ORAL | Status: DC | PRN

## 2012-05-19 NOTE — Progress Notes (Signed)
GYNECOLOGY CLINIC PROGRESS NOTE  History:  27 y.o. [id number] here today for followup of evaluation of pelvic pain.  She reports continued pain.  She had an ultrasound on  05/12/12 and wants to know the results.  No other acute GYN concerns. Of note, patient has a significant FH of ovarian cancer, mother diagnosed in her 30s.  Patient is in the process of getting testing for BRCA mutations.  The following portions of the patient's history were reviewed and updated as appropriate: allergies, current medications, past family history, past medical history, past social history, past surgical history and problem list.  Normal pap with negative GC and Chlam on 05/11/12.  Review of Systems:  Pertinent items are noted in HPI.  Objective:  Physical Exam BP 106/67  Pulse 77  Wt 160 lb (72.576 kg)  BMI 24.33 kg/m2 Gen: NAD Rest of exam deferred  Labs and Imaging 05/12/2012   TRANSABDOMINAL AND TRANSVAGINAL ULTRASOUND OF PELVIS Clinical Data: Lower abdominal and pelvic pain.  Personal history of poly cystic ovary syndrome and prior cesarean sections.   Technique:  Both transabdominal and transvaginal ultrasound examinations of the pelvis were performed.  Transabdominal technique was performed for global imaging of the pelvis including uterus, ovaries, adnexal regions, and pelvic cul-de-sac.  It was necessary to proceed with endovaginal exam following the transabdominal exam to visualize the IUD and ovaries. 3-D volume imaging was also performed by transvaginal approach.  Comparison:  None.  Findings: Uterus:  8.4 by 4.1 x 5.7 cm.  No fibroids or other uterine mass identified.  Endometrium: IUD is seen within the endometrial cavity. The IUD sidearms are seen in normal location in the fundal portion of the endometrial cavity, however the inferior edge of the IUD is seen in the left lower uterine segment and may penetrate into the myometrium.  Right ovary: 4.7 x 2.8 x 3.1 cm.  A benign appearing hemorrhagic cyst is  noted in the right ovary which measures 3.2 cm in maximum diameter.  Left ovary: 3.1 x 1.5 x 1.5 cm.  Normal appearance.  Other Findings:  No free fluid  IMPRESSION:  1.  3.2 cm benign appearing hemorrhagic cyst in the right ovary. 2.  Normal appearance of left ovary. 3.  IUD seen in endometrial cavity, with possible myometrial penetration of its inferior edge in the left lower uterine segment.   Original Report Authenticated By: Myles Rosenthal, M.D.    Assessment & Plan:   Ultrasound showed benign 3 cm cyst that should resolve on its own Possible IUD malposition Results discussed with patient.  She feels the pain worsened after her cesarean section and feels it could be due to adhesions, interested in surgical evaluation.  Also, she is interested in prophylactic BSO given FH of ovarian cancer but wants to find out if she has the mutations first.  She also is considering removal of IUD and to use OCPs for now; says she may want BTS with endometrial ablation if she does not undergo BSO, given her history of heavy periods.  She wants to be scheduled for operative laparoscopy for now, will add on other procedures as indicated by results and her decisions.  Will put her on schedule for laparoscopy for now, await genetic testing results.   Will have another preoperative appointment once the procedures have been scheduled and the results are available.  Ibuprofen prescribed for now; if she decides to go on OCPs and undergo IUD removal, that will be arranged later.

## 2012-05-19 NOTE — Patient Instructions (Signed)
Return to clinic for any scheduled appointments or for any gynecologic concerns as needed.   

## 2012-05-19 NOTE — Progress Notes (Signed)
Here today for follow up, still having pelvic pain.  To go over ultrasound results.

## 2012-05-22 ENCOUNTER — Other Ambulatory Visit (INDEPENDENT_AMBULATORY_CARE_PROVIDER_SITE_OTHER): Payer: Managed Care, Other (non HMO) | Admitting: *Deleted

## 2012-05-22 DIAGNOSIS — Z8049 Family history of malignant neoplasm of other genital organs: Secondary | ICD-10-CM

## 2012-05-22 DIAGNOSIS — Z8041 Family history of malignant neoplasm of ovary: Secondary | ICD-10-CM

## 2012-05-22 NOTE — Progress Notes (Signed)
Patient is here for BRCA testing.  She prefers to have the testing done through [facility] as this is where she works.

## 2012-07-04 ENCOUNTER — Telehealth: Payer: Self-pay | Admitting: *Deleted

## 2012-07-04 ENCOUNTER — Encounter (HOSPITAL_COMMUNITY): Payer: Self-pay

## 2012-07-04 NOTE — Telephone Encounter (Signed)
Called in medication to pt pharmacy, pt aware.

## 2012-07-04 NOTE — Telephone Encounter (Signed)
Error - Already handled by [employer]

## 2012-07-04 NOTE — Telephone Encounter (Signed)
Message copied by Grayland Ormond on Tue Jul 04, 2012  8:31 AM ------      Message from: Jaynie Collins A      Created: Mon Jul 03, 2012  4:58 PM      Regarding: RE: Regarding pain meds        She is scheduled for surgery on 07/20/12.  Cledis Sohn or Angelica Chessman can call in Vicodin 5/325 mg 1-2 tabs po q6 prn pain, dispense 30 tablets.              Thanks!            [facility]      ----- Message -----         From: Harlene Salts         Sent: 07/03/2012   1:33 PM           To: Tereso Newcomer, MD      Subject: Regardfing meds                                          Hi Dr. Macon Large,            This patient was put on a pain medicine by you and she called wanting to know what else she can take or do because the medicine is not working. Please send Chrissy Ebonique Hallstrom the reply I was showing her how to put a message in through staff message. Please let her know what she needs to do,thanks .        ------

## 2012-07-06 ENCOUNTER — Encounter: Payer: Self-pay | Admitting: Obstetrics & Gynecology

## 2012-07-06 ENCOUNTER — Encounter: Payer: Self-pay | Admitting: *Deleted

## 2012-07-06 LAB — BRCASSURE COMPREHENSIVE TEST

## 2012-07-10 ENCOUNTER — Telehealth: Payer: Self-pay | Admitting: *Deleted

## 2012-07-10 NOTE — Telephone Encounter (Signed)
Message copied by Grayland Ormond on Mon Jul 10, 2012 10:59 AM ------      Message from: Jaynie Collins A      Created: Thu Jul 06, 2012  2:45 PM       Negative BRCA analysis.  Please call to inform patient of results.       ------

## 2012-07-10 NOTE — Telephone Encounter (Signed)
Pt aware of test results.   Pt is taking her pain medication and it helps but pt is unable to go into work because the pain medication is making her go to sleep.   Pt wanted to know is there anyway the surgery can be moved up any sooner because pt can't keep missing work.  Pt would like Dr. Macon Large for surgery but if Dr. Shawnie Pons had opening sooner she would be okay with her doing surgery as well.  Pt cb#  [phone].

## 2012-07-11 ENCOUNTER — Other Ambulatory Visit: Payer: Self-pay | Admitting: Obstetrics & Gynecology

## 2012-07-11 DIAGNOSIS — R102 Pelvic and perineal pain: Secondary | ICD-10-CM

## 2012-07-11 MED ORDER — TRAMADOL HCL 50 MG PO TABS: 50.0000 mg | ORAL_TABLET | Freq: Four times a day (QID) | ORAL | Status: DC | PRN

## 2012-07-11 NOTE — Progress Notes (Signed)
Patient called requesting different pain medication that will not make her too drowsy but effective for her pain. Ibuprofen does not work.  Tramadol prescribed. She has surgery on 07/20/12.

## 2012-07-14 ENCOUNTER — Encounter (HOSPITAL_COMMUNITY)
Admission: RE | Admit: 2012-07-14 | Discharge: 2012-07-14 | Disposition: A | Discharge status: Home / Self Care | Payer: Managed Care, Other (non HMO) | Source: Ambulatory Visit | Attending: Obstetrics & Gynecology | Admitting: Obstetrics & Gynecology

## 2012-07-14 ENCOUNTER — Encounter (HOSPITAL_COMMUNITY): Payer: Self-pay

## 2012-07-14 LAB — CBC
Hemoglobin: 11.8 g/dL — ABNORMAL LOW (ref 12.0–15.0)
MCH: 29.6 pg (ref 26.0–34.0)
Platelets: 157 10*3/uL (ref 150–400)
RBC: 3.99 MIL/uL (ref 3.87–5.11)
WBC: 6.9 10*3/uL (ref 4.0–10.5)

## 2012-07-14 NOTE — Patient Instructions (Addendum)
20 Debra Hernandez  07/14/2012   Your procedure is scheduled on:  07/20/12  Enter through the Main Entrance of [facility] at 8 AM.  Pick up the phone at the desk and dial [phone].   Call this number if you have problems the morning of surgery: [phone]   Remember:   Do not eat food:After Midnight.  Do not drink clear liquids: After Midnight.  Take these medicines the morning of surgery with A SIP OF WATER: NA   Do not wear jewelry, make-up or nail polish.  Do not wear lotions, powders, or perfumes. You may wear deodorant.  Do not shave 48 hours prior to surgery.  Do not bring valuables to the hospital.  [facility] is not responsible                  for any belongings or valuables brought to the hospital.  Contacts, dentures or bridgework may not be worn into surgery.  Leave suitcase in the car. After surgery it may be brought to your room.  For patients admitted to the hospital, checkout time is 11:00 AM the day of                discharge.   Patients discharged the day of surgery will not be allowed to drive                   home.  Name and phone number of your driver: NA  Special Instructions: Shower using CHG 2 nights before surgery and the night before surgery.  If you shower the day of surgery use CHG.  Use special wash - you have one bottle of CHG for all showers.  You should use approximately 1/3 of the bottle for each shower.   Please read over the following fact sheets that you were given: Surgical Site Infection Prevention

## 2012-07-20 ENCOUNTER — Ambulatory Visit (HOSPITAL_COMMUNITY)
Admission: RE | Admit: 2012-07-20 | Discharge: 2012-07-20 | Disposition: A | Discharge status: Home / Self Care | Payer: Managed Care, Other (non HMO) | Source: Ambulatory Visit | Attending: Obstetrics & Gynecology | Admitting: Obstetrics & Gynecology

## 2012-07-20 ENCOUNTER — Encounter (HOSPITAL_COMMUNITY)
Admission: RE | Disposition: A | Discharge status: Home / Self Care | Payer: Self-pay | Source: Ambulatory Visit | Attending: Obstetrics & Gynecology

## 2012-07-20 ENCOUNTER — Encounter (HOSPITAL_COMMUNITY): Payer: Self-pay | Admitting: Anesthesiology

## 2012-07-20 ENCOUNTER — Ambulatory Visit (HOSPITAL_COMMUNITY)
Admission: RE | Admit: 2012-07-20 | Payer: Managed Care, Other (non HMO) | Source: Ambulatory Visit | Admitting: Obstetrics & Gynecology

## 2012-07-20 ENCOUNTER — Encounter (HOSPITAL_COMMUNITY): Admission: RE | Payer: Self-pay | Source: Ambulatory Visit

## 2012-07-20 ENCOUNTER — Ambulatory Visit (HOSPITAL_COMMUNITY): Payer: Managed Care, Other (non HMO) | Admitting: Anesthesiology

## 2012-07-20 DIAGNOSIS — Z8041 Family history of malignant neoplasm of ovary: Secondary | ICD-10-CM | POA: Insufficient documentation

## 2012-07-20 DIAGNOSIS — Z302 Encounter for sterilization: Secondary | ICD-10-CM

## 2012-07-20 DIAGNOSIS — Z30432 Encounter for removal of intrauterine contraceptive device: Secondary | ICD-10-CM | POA: Insufficient documentation

## 2012-07-20 DIAGNOSIS — E282 Polycystic ovarian syndrome: Secondary | ICD-10-CM | POA: Insufficient documentation

## 2012-07-20 DIAGNOSIS — N920 Excessive and frequent menstruation with regular cycle: Secondary | ICD-10-CM | POA: Diagnosis present

## 2012-07-20 DIAGNOSIS — R102 Pelvic and perineal pain unspecified side: Secondary | ICD-10-CM | POA: Diagnosis present

## 2012-07-20 DIAGNOSIS — N949 Unspecified condition associated with female genital organs and menstrual cycle: Secondary | ICD-10-CM

## 2012-07-20 HISTORY — PX: NOVASURE ABLATION: SHX5394

## 2012-07-20 HISTORY — PX: LAPAROSCOPIC BILATERAL SALPINGECTOMY: SHX5889

## 2012-07-20 LAB — PREGNANCY, URINE: Preg Test, Ur: NEGATIVE

## 2012-07-20 SURGERY — SALPINGECTOMY, BILATERAL, LAPAROSCOPIC
Anesthesia: General | Site: Vagina | Wound class: Clean Contaminated

## 2012-07-20 SURGERY — SALPINGECTOMY, BILATERAL, OPEN
Anesthesia: Choice | Site: Vagina

## 2012-07-20 MED ORDER — FENTANYL CITRATE 0.05 MG/ML IJ SOLN
INTRAMUSCULAR | Status: AC
  Filled 2012-07-20: qty 2

## 2012-07-20 MED ORDER — ONDANSETRON HCL 4 MG/2ML IJ SOLN
INTRAMUSCULAR | Status: AC
  Filled 2012-07-20: qty 2

## 2012-07-20 MED ORDER — NEOSTIGMINE METHYLSULFATE 1 MG/ML IJ SOLN
INTRAMUSCULAR | Status: DC | PRN
  Administered 2012-07-20: 3 mg via INTRAVENOUS

## 2012-07-20 MED ORDER — CEFAZOLIN SODIUM-DEXTROSE 2-3 GM-% IV SOLR
INTRAVENOUS | Status: AC
  Filled 2012-07-20: qty 50

## 2012-07-20 MED ORDER — MIDAZOLAM HCL 5 MG/5ML IJ SOLN
INTRAMUSCULAR | Status: DC | PRN
  Administered 2012-07-20: 2 mg via INTRAVENOUS

## 2012-07-20 MED ORDER — FENTANYL CITRATE 0.05 MG/ML IJ SOLN
INTRAMUSCULAR | Status: AC
  Filled 2012-07-20: qty 5

## 2012-07-20 MED ORDER — NEOSTIGMINE METHYLSULFATE 1 MG/ML IJ SOLN
INTRAMUSCULAR | Status: AC
  Filled 2012-07-20: qty 1

## 2012-07-20 MED ORDER — LIDOCAINE HCL (CARDIAC) 20 MG/ML IV SOLN
INTRAVENOUS | Status: DC | PRN
  Administered 2012-07-20: 50 mg via INTRAVENOUS

## 2012-07-20 MED ORDER — BUPIVACAINE HCL (PF) 0.5 % IJ SOLN
INTRAMUSCULAR | Status: AC
  Filled 2012-07-20: qty 30

## 2012-07-20 MED ORDER — PROPOFOL 10 MG/ML IV EMUL
INTRAVENOUS | Status: AC
  Filled 2012-07-20: qty 20

## 2012-07-20 MED ORDER — METOCLOPRAMIDE HCL 5 MG/ML IJ SOLN
INTRAMUSCULAR | Status: AC
  Filled 2012-07-20: qty 2

## 2012-07-20 MED ORDER — METOCLOPRAMIDE HCL 5 MG/ML IJ SOLN
10.0000 mg | Freq: Once | INTRAMUSCULAR | Status: AC
  Administered 2012-07-20: 10 mg via INTRAVENOUS

## 2012-07-20 MED ORDER — ROCURONIUM BROMIDE 100 MG/10ML IV SOLN
INTRAVENOUS | Status: DC | PRN
  Administered 2012-07-20: 20 mg via INTRAVENOUS
  Administered 2012-07-20 (×2): 10 mg via INTRAVENOUS

## 2012-07-20 MED ORDER — DEXAMETHASONE SODIUM PHOSPHATE 10 MG/ML IJ SOLN
INTRAMUSCULAR | Status: AC
  Filled 2012-07-20: qty 1

## 2012-07-20 MED ORDER — PROMETHAZINE HCL 25 MG/ML IJ SOLN
INTRAMUSCULAR | Status: AC
  Filled 2012-07-20: qty 1

## 2012-07-20 MED ORDER — BUPIVACAINE HCL (PF) 0.5 % IJ SOLN
INTRAMUSCULAR | Status: DC | PRN
  Administered 2012-07-20: 10 mL
  Administered 2012-07-20: 3 mL
  Administered 2012-07-20: 1 mL
  Administered 2012-07-20: 2 mL

## 2012-07-20 MED ORDER — CEFAZOLIN SODIUM-DEXTROSE 2-3 GM-% IV SOLR
2.0000 g | INTRAVENOUS | Status: AC
  Administered 2012-07-20: 2 g via INTRAVENOUS

## 2012-07-20 MED ORDER — PROMETHAZINE HCL 25 MG/ML IJ SOLN
6.2500 mg | INTRAMUSCULAR | Status: DC | PRN
  Administered 2012-07-20: 6.25 mg via INTRAVENOUS

## 2012-07-20 MED ORDER — LACTATED RINGERS IV SOLN
INTRAVENOUS | Status: DC
  Administered 2012-07-20 (×3): via INTRAVENOUS

## 2012-07-20 MED ORDER — MIDAZOLAM HCL 2 MG/2ML IJ SOLN
INTRAMUSCULAR | Status: AC
  Filled 2012-07-20: qty 2

## 2012-07-20 MED ORDER — ROCURONIUM BROMIDE 50 MG/5ML IV SOLN
INTRAVENOUS | Status: AC
  Filled 2012-07-20: qty 1

## 2012-07-20 MED ORDER — GLYCOPYRROLATE 0.2 MG/ML IJ SOLN
INTRAMUSCULAR | Status: AC
  Filled 2012-07-20: qty 3

## 2012-07-20 MED ORDER — KETOROLAC TROMETHAMINE 30 MG/ML IJ SOLN: 15.0000 mg | Freq: Once | INTRAMUSCULAR | Status: DC | PRN

## 2012-07-20 MED ORDER — OXYCODONE-ACETAMINOPHEN 5-325 MG PO TABS
ORAL_TABLET | ORAL | Status: AC
  Filled 2012-07-20: qty 1

## 2012-07-20 MED ORDER — FENTANYL CITRATE 0.05 MG/ML IJ SOLN
INTRAMUSCULAR | Status: DC | PRN
  Administered 2012-07-20: 50 ug via INTRAVENOUS
  Administered 2012-07-20: 100 ug via INTRAVENOUS
  Administered 2012-07-20 (×2): 50 ug via INTRAVENOUS

## 2012-07-20 MED ORDER — LIDOCAINE HCL (CARDIAC) 20 MG/ML IV SOLN
INTRAVENOUS | Status: AC
  Filled 2012-07-20: qty 5

## 2012-07-20 MED ORDER — IBUPROFEN 600 MG PO TABS: 600.0000 mg | ORAL_TABLET | Freq: Four times a day (QID) | ORAL | Status: DC | PRN

## 2012-07-20 MED ORDER — DEXAMETHASONE SODIUM PHOSPHATE 4 MG/ML IJ SOLN
INTRAMUSCULAR | Status: DC | PRN
  Administered 2012-07-20: 8 mg via INTRAVENOUS

## 2012-07-20 MED ORDER — KETOROLAC TROMETHAMINE 30 MG/ML IJ SOLN
INTRAMUSCULAR | Status: DC | PRN
  Administered 2012-07-20: 30 mg via INTRAVENOUS

## 2012-07-20 MED ORDER — LACTATED RINGERS IR SOLN
Status: DC | PRN
  Administered 2012-07-20: 3000 mL

## 2012-07-20 MED ORDER — OXYCODONE-ACETAMINOPHEN 5-325 MG PO TABS
1.0000 | ORAL_TABLET | Freq: Once | ORAL | Status: AC
  Administered 2012-07-20: 1 via ORAL

## 2012-07-20 MED ORDER — MEPERIDINE HCL 25 MG/ML IJ SOLN: 6.2500 mg | INTRAMUSCULAR | Status: DC | PRN

## 2012-07-20 MED ORDER — PROPOFOL 10 MG/ML IV BOLUS
INTRAVENOUS | Status: DC | PRN
  Administered 2012-07-20: 200 mg via INTRAVENOUS

## 2012-07-20 MED ORDER — DOCUSATE SODIUM 100 MG PO CAPS: 100.0000 mg | ORAL_CAPSULE | Freq: Two times a day (BID) | ORAL | Status: DC | PRN

## 2012-07-20 MED ORDER — FENTANYL CITRATE 0.05 MG/ML IJ SOLN
25.0000 ug | INTRAMUSCULAR | Status: DC | PRN
  Administered 2012-07-20 (×4): 25 ug via INTRAVENOUS

## 2012-07-20 MED ORDER — OXYCODONE-ACETAMINOPHEN 5-325 MG PO TABS: 1.0000 | ORAL_TABLET | Freq: Four times a day (QID) | ORAL | Status: DC | PRN

## 2012-07-20 MED ORDER — LACTATED RINGERS IV SOLN
INTRAVENOUS | Status: DC
  Administered 2012-07-20: 09:00:00 via INTRAVENOUS

## 2012-07-20 MED ORDER — ONDANSETRON HCL 4 MG/2ML IJ SOLN
INTRAMUSCULAR | Status: DC | PRN
  Administered 2012-07-20: 4 mg via INTRAVENOUS

## 2012-07-20 MED ORDER — MIDAZOLAM HCL 2 MG/2ML IJ SOLN: 0.5000 mg | Freq: Once | INTRAMUSCULAR | Status: DC | PRN

## 2012-07-20 MED ORDER — GLYCOPYRROLATE 0.2 MG/ML IJ SOLN
INTRAMUSCULAR | Status: DC | PRN
  Administered 2012-07-20: .5 mg via INTRAVENOUS

## 2012-07-20 SURGICAL SUPPLY — 32 items
ABLATOR ENDOMETRIAL BIPOLAR (ABLATOR) ×3 IMPLANT
BAG SPEC RTRVL LRG 6X4 10 (ENDOMECHANICALS) ×2
CABLE HIGH FREQUENCY MONO STRZ (ELECTRODE) IMPLANT
CATH ROBINSON RED A/P 16FR (CATHETERS) ×2 IMPLANT
CHLORAPREP W/TINT 26ML (MISCELLANEOUS) ×3 IMPLANT
CLOTH BEACON ORANGE TIMEOUT ST (SAFETY) ×3 IMPLANT
CONTAINER PREFILL 10% NBF 60ML (FORM) IMPLANT
FORCEPS CUTTING 33CM 5MM (CUTTING FORCEPS) IMPLANT
GLOVE BIO SURGEON STRL SZ7 (GLOVE) ×3 IMPLANT
GLOVE BIOGEL PI IND STRL 7.0 (GLOVE) ×2 IMPLANT
GLOVE BIOGEL PI INDICATOR 7.0 (GLOVE) ×1
GOWN PREVENTION PLUS LG XLONG (DISPOSABLE) ×6 IMPLANT
GOWN STRL REIN XL XLG (GOWN DISPOSABLE) ×3 IMPLANT
NEEDLE INSUFFLATION 120MM (ENDOMECHANICALS) ×3 IMPLANT
NS IRRIG 1000ML POUR BTL (IV SOLUTION) ×3 IMPLANT
PACK LAPAROSCOPY BASIN (CUSTOM PROCEDURE TRAY) ×3 IMPLANT
PACK VAGINAL MINOR WOMEN LF (CUSTOM PROCEDURE TRAY) ×3 IMPLANT
POUCH SPECIMEN RETRIEVAL 10MM (ENDOMECHANICALS) ×1 IMPLANT
PROTECTOR NERVE ULNAR (MISCELLANEOUS) ×3 IMPLANT
SCALPEL HARMONIC ACE (MISCELLANEOUS) ×1 IMPLANT
SEALER TISSUE G2 CVD JAW 35 (ENDOMECHANICALS) IMPLANT
SEALER TISSUE G2 CVD JAW 45CM (ENDOMECHANICALS)
SET IRRIG TUBING LAPAROSCOPIC (IRRIGATION / IRRIGATOR) ×1 IMPLANT
SUT VIC AB 3-0 X1 27 (SUTURE) IMPLANT
SUT VICRYL 0 UR6 27IN ABS (SUTURE) ×6 IMPLANT
SUT VICRYL 4-0 PS2 18IN ABS (SUTURE) ×3 IMPLANT
TOWEL OR 17X24 6PK STRL BLUE (TOWEL DISPOSABLE) ×6 IMPLANT
TRAY FOLEY CATH 14FR (SET/KITS/TRAYS/PACK) ×3 IMPLANT
TROCAR XCEL NON-BLD 11X100MML (ENDOMECHANICALS) ×3 IMPLANT
TROCAR XCEL NON-BLD 5MMX100MML (ENDOMECHANICALS) ×6 IMPLANT
WARMER LAPAROSCOPE (MISCELLANEOUS) ×3 IMPLANT
WATER STERILE IRR 1000ML POUR (IV SOLUTION) ×2 IMPLANT

## 2012-07-20 NOTE — H&P (Signed)
Preoperative History and Physical  Debra Hernandez is a 27 y.o. [phone] here for surgical management of undesired fertility, chronic pelvic pain and menorrhagia  Proposed surgery: Laparoscopic bilateral salpingectomy, Novasure endometrial ablation, removal of IUD  Past Medical History  Diagnosis Date  . Anemia   . Polycystic ovary syndrome   . History of seasonal allergies   . Blood transfusion 5/11    2 units bld transfused at [facility]  . Kidney stone     passsed stone - no surgery required  . Melanoma 2003    on back - used local to removed   Past Surgical History  Procedure Laterality Date  . Cesarean section  05/2009    at 39 wks at [facility],  lived only 12 days  . Laporascopy    . Tonsillectomy    . Tonsillectomy and adenoidectomy    . Diagnostic laparoscopy      ovarian cyst removed  . Wisdom tooth extraction      2 lower extracted  . Svd  04/2006    x 1 - at [facility]  . Cesarean section  02/02/2011    Procedure: CESAREAN SECTION;  Surgeon: Reva Bores, MD;  Location: [facility] ORS;  Service: Gynecology;  Laterality: N/A;  Repeart c/section  . Scar revision  02/02/2011    Procedure: SCAR REVISION;  Surgeon: Reva Bores, MD;  Location: [facility] ORS;  Service: Gynecology;  Laterality: N/A;   OB History   Grav Para Term Preterm Abortions TAB SAB Ect Mult Living   3 3 3  0 0 0 0 0 0 2     Patient denies any cervical dysplasia or STIs. Prescriptions prior to admission  Medication Sig Dispense Refill  . ferrous sulfate 325 (65 FE) MG tablet Take 325 mg by mouth daily with breakfast.      . HYDROcodone-acetaminophen (NORCO/VICODIN) 5-325 MG per tablet Take 1 tablet by mouth every 6 (six) hours as needed for pain.      [name] ibuprofen (ADVIL,MOTRIN) 600 MG tablet Take 600 mg by mouth daily. For pain      . levonorgestrel (MIRENA) 20 MCG/24HR IUD 1 each by Intrauterine route once.      . Multiple Vitamin (ONE-A-DAY ESSENTIAL) TABS Take 1 tablet by mouth  daily.       [name] PRESCRIPTION MEDICATION Take 1 capsule by mouth daily. Unknown prescription iron supplement given by OB/Gyn      . valACYclovir (VALTREX) 1000 MG tablet Take 1,000 mg by mouth 2 (two) times daily as needed (cold sores).      . traMADol (ULTRAM) 50 MG tablet Take 1-2 tablets (50-100 mg total) by mouth every 6 (six) hours as needed for pain.  60 tablet  0    Allergies  Allergen Reactions  . Strattera (Atomoxetine Hcl) Nausea And Vomiting and Other (See Comments)    insomnia   Social History:   reports that she has never smoked. She has never used smokeless tobacco. She reports that  drinks alcohol. She reports that she does not use illicit drugs. Family History  Problem Relation Age of Onset  . Cancer Mother     Ovarian  . Parkinson's disease Mother   . Mental illness Mother   . Cancer Maternal Grandmother     Ovarian  . Parkinson's disease Maternal Grandmother   . Diabetes Maternal Grandfather   . Anesthesia problems Neg Hx   . Hypotension Neg Hx   . Malignant hyperthermia Neg Hx   .  Pseudochol deficiency Neg Hx   . Cancer Father     prostate  . Cancer Paternal Aunt 4    Breast    Review of Systems: Noncontributory  PHYSICAL EXAM: Blood pressure 108/68, pulse 68, temperature 98.2 F (36.8 C), temperature source Oral, resp. rate 18, SpO2 100.00%, not currently breastfeeding. General appearance - alert, well appearing, and in no distress Chest - clear to auscultation, no wheezes, rales or rhonchi, symmetric air entry Heart - normal rate and regular rhythm Abdomen - soft, nontender, nondistended, no masses or organomegaly, well-healed surgical incisions Pelvic - examination not indicated Extremities - peripheral pulses normal, no pedal edema, no clubbing or cyanosis  Labs: Results for orders placed during the hospital encounter of 07/20/12 (from the past 336 hour(s))  PREGNANCY, URINE   Collection Time    07/20/12  8:00 AM      Result Value Range   Preg  Test, Ur NEGATIVE  NEGATIVE  Results for orders placed during the hospital encounter of 07/14/12 (from the past 336 hour(s))  CBC   Collection Time    07/14/12  3:28 PM      Result Value Range   WBC 6.9  4.0 - 10.5 K/uL   RBC 3.99  3.87 - 5.11 MIL/uL   Hemoglobin 11.8 (*) 12.0 - 15.0 g/dL   HCT 04.5  40.9 - 81.1 %   MCV 90.7  78.0 - 100.0 fL   MCH 29.6  26.0 - 34.0 pg   MCHC 32.6  30.0 - 36.0 g/dL   RDW 91.4  78.2 - 95.6 %   Platelets 157  150 - 400 K/uL    Imaging Studies: No results found.  Assessment: Patient Active Problem List   Diagnosis Date Noted  . Significan Family History of Ovarian Cancer 05/19/2012  . Chronic Pelvic Pain 05/19/2012  . Menorrhagia 05/19/2012  . Polycystic ovary syndrome     Plan: Patient will undergo surgical management with laparoscopic bilateral salpingectomy, Novasure endometrial ablation, removal of IUD.   The risks of surgery were discussed in detail with the patient including but not limited to: bleeding which may require transfusion or reoperation; infection which may require antibiotics; injury to surrounding organs which may involve uterus, bowel, bladder, ureters ; need for additional procedures including laparotomy; thromboembolic phenomenon, surgical site problems and other postoperative/anesthesia complications.  Likelihood of success in alleviating the patient's condition was discussed.  Patient desires also permanent sterilization. Other reversible forms of contraception were discussed with patient; she declines all other modalities. Additional risks of the sterilization were discussed with patient including but not limited to: risk of regret, permanence of method, failure risk of <1% with increased risk of ectopic gestation if pregnancy occurs was also discussed with patient.  Patient verbalized understanding of these risks and wants to proceed with the procedures.  Routine postoperative instructions will be reviewed with the patient and her  family in detail after surgery.  The patient concurred with the proposed plan, giving informed written consent for the surgery.  Patient has been NPO since last night she will remain NPO for procedure.  Anesthesia and OR aware.  Preoperative prophylactic antibiotics and SCDs ordered on call to the OR.  To OR when ready.  Jaynie Collins, M.D. 07/20/2012 9:16 AM

## 2012-07-20 NOTE — Anesthesia Preprocedure Evaluation (Signed)
Anesthesia Evaluation  Patient identified by MRN, date of birth, ID band Patient awake    Reviewed: Allergy & Precautions, H&P , Patient's Chart, lab work & pertinent test results, reviewed documented beta blocker date and time   History of Anesthesia Complications Negative for: history of anesthetic complications  Airway Mallampati: II TM Distance: >3 FB Neck ROM: full    Dental no notable dental hx.    Pulmonary neg pulmonary ROS,  breath sounds clear to auscultation  Pulmonary exam normal       Cardiovascular Exercise Tolerance: Good negative cardio ROS  Rhythm:regular Rate:Normal     Neuro/Psych negative neurological ROS  negative psych ROS   GI/Hepatic negative GI ROS, Neg liver ROS,   Endo/Other  negative endocrine ROS  Renal/GU Renal diseasenegative Renal ROS     Musculoskeletal   Abdominal   Peds  Hematology negative hematology ROS (+)   Anesthesia Other Findings Anemia     Polycystic ovary syndrome        History of seasonal allergies     Blood transfusion 5/11 2 units bld transfused at [facility]    Kidney stone   passsed stone - no surgery required Melanoma 2003 on back - used local to removed    Reproductive/Obstetrics negative OB ROS                           Anesthesia Physical Anesthesia Plan  ASA: II  Anesthesia Plan: General ETT   Post-op Pain Management:    Induction:   Airway Management Planned:   Additional Equipment:   Intra-op Plan:   Post-operative Plan:   Informed Consent: I have reviewed the patients History and Physical, chart, labs and discussed the procedure including the risks, benefits and alternatives for the proposed anesthesia with the patient or authorized representative who has indicated his/her understanding and acceptance.   Dental Advisory Given  Plan Discussed with: CRNA and Surgeon  Anesthesia Plan Comments:          Anesthesia Quick Evaluation

## 2012-07-20 NOTE — Anesthesia Postprocedure Evaluation (Signed)
Anesthesia Post Note  Patient: Debra Hernandez  Procedure(s) Performed: Procedure(s) (LRB): LAPAROSCOPIC BILATERAL SALPINGECTOMY (Bilateral) NOVASURE ABLATION (N/A)  Anesthesia type: General  Patient location: PACU  Post pain: Pain level controlled  Post assessment: Post-op Vital signs reviewed  Last Vitals:  Filed Vitals:   07/20/12 1145  BP: 102/60  Pulse: 66  Temp:   Resp: 13    Post vital signs: Reviewed  Level of consciousness: sedated  Complications: No apparent anesthesia complications

## 2012-07-20 NOTE — Transfer of Care (Signed)
Immediate Anesthesia Transfer of Care Note  Patient: Debra Hernandez  Procedure(s) Performed: Procedure(s): LAPAROSCOPIC BILATERAL SALPINGECTOMY (Bilateral) NOVASURE ABLATION (N/A)  Patient Location: PACU  Anesthesia Type:General  Level of Consciousness: awake, alert  and patient cooperative  Airway & Oxygen Therapy: Patient Spontanous Breathing and Patient connected to nasal cannula oxygen  Post-op Assessment: Report given to PACU RN and Post -op Vital signs reviewed and stable  Post vital signs: stable  Complications: No apparent anesthesia complications

## 2012-07-20 NOTE — Op Note (Signed)
Debra Hernandez PROCEDURE DATE: 07/20/2012  PREOPERATIVE DIAGNOSES:  Undesired fertility, chronic pelvic pain, menorrhagia  POSTOPERATIVE DIAGNOSIS:  The same  PROCEDURE:  Laparoscopic Bilateral Salpingectomy, Peritoneal Biopsies, Lysis of adhesions, Hysteroscopy, Novasure endometrial ablation and Mirena IUD removal   SURGEON: Jaynie Collins, MD  ASSISTANT: Nicholaus Bloom, MD  ANESTHESIOLOGIST: Brayton Caves, MD  ANESTHESIA:  General endotracheal  COMPLICATIONS:  None immediate.  ESTIMATED BLOOD LOSS:  25 ml.  FLUIDS: 1000 ml LR.  URINE OUTPUT: 100 ml of clear urine.  IINDICATIONS: 27 y.o. [id number] with undesired fertility, desires permanent sterilization. Other reversible forms of contraception were discussed with patient; she declines all other modalities.  Risks of procedure discussed with patient including permanence of method, risk of regret, bleeding, infection, injury to surrounding organs and need for additional procedures including laparotomy.  Failure risk less than 0.5% with increased risk of ectopic gestation if pregnancy occurs was also discussed with patient.  Patient also has chronic pelvic pain, she was told that biopsies will be taken of any abnormal tissues visualized during laparoscopy.  She also has menorrhagia; she desires removal of Mirena IUD and to obtain Novasure endometrial ablation.  Written informed consent was obtained.    FINDINGS:  A 8 week size uterus, normal apperaing fallopian tubes and ovaries. Both fallopian tubes were resected.  Multiple filmy adhesions around adnexa which were easily lysed.  Adhesions, peritoneal windows and light brown lesions in the posterior cul-de-sac which were biopsied, concerning for endometriosis. Thick vesicouterine adhesion due to previous cesarean sections; this was lysed.  On hysteroscopy, there was diffuse proliferative endometrium and normal ostia bilaterally.  PROCEDURE DETAILS: The patient was taken to the operating room  where general anesthesia was obtained without difficulty.  She was then placed in the dorsal lithotomy position and prepared and draped in sterile fashion.  After an adequate timeout was performed, a bivalved speculum was then placed in the patient's vagina, and the anterior lip of cervix grasped with the single-tooth tenaculum.  The IUD strings were visualized, grasped with the ring forceps and the IUD was removed successfully.  The uterine manipulator was then advanced into the uterus.  The speculum was removed from the vagina.  Attention was then turned to the patient's abdomen where a 11-mm skin incision was made in the umbilical fold.  The Optiview 11-mm trocar and sleeve were then advanced without difficulty with the laparoscope under direct visualization into the abdomen.  The abdomen was then insufflated with carbon dioxide gas.  Adequate pneumoperitoneum was obtained.  A survey of the patient's pelvis and abdomen revealed the findings above.  Bilateral 5-mm lower quadrant ports  were then placed under direct visualization.  The fallopian tubes were transected from the uterine attachments and the underlying mesosalpinx with the Harmonic device allowing for bilateral salpingectomy.  The fallopian tubes were then removed from the abdomen under direct visualization.  The peritoneal biopsies were done as mentioned above, and the adhesions were lysed using blunt methods and the Harmonic device.  The operative site was surveyed, and it was found to be hemostatic.   No intraoperative injury to other surrounding organs was noted.  The abdomen was desufflated and all instruments were then removed from the patient's abdomen. The fascial incision of the umbilicus was closed with a 0 Vicryl figure of eight stitch. All skin incisions were closed with Dermabond.    Attention was then turned to the patient's pelvis.   A speculum was placed in the patient's vagina and a single tooth  tenaculum was applied to the anterior  lip of the cervix.  The sound was used to obtain the cervical and uterine cavity length measurements at 4 cm and 4 cm respectively; total sounding length 8 cm.  The cervix was dilated manually with Hegar dilators to accommodate the diagnostic hysteroscope.  Once the cervix was dilated, the hysteroscope was inserted under direct visualization.   The hysteroscope was also used to determine the level of the internal os, and measurements were confirmed. The uterine cavity was carefully examined, both ostia were recognized, and diffusely proliferative endometrium with polypoid fragments was noted. The hysteroscope was removed and a curettage was done to obtain some endometrial curettings.  The cervix was further dilated to accommodate the NovaSure device.  The NovaSure device was inserted, and a cavity width of 4 cm was determined. Using a power of 81 watts, for 104 sec, the endometrial ablation was performed. The hysteroscope was then re-introduced into the uterine cavity, confirming complete ablation of the endometrium. The tenaculum was removed from the anterior lip of the cervix, and the vaginal speculum was removed after noting good hemostasis.    The patient tolerated all procedures well and was taken to the recovery area awake, extubated and in stable condition.  She will be discharged to home as per PACU criteria.  Routine postoperative instructions given.  She was prescribed Percocet, Ibuprofen and Colace.  She will follow up in the clinic on 08/17/12 for postoperative evaluation.

## 2012-07-21 ENCOUNTER — Encounter (HOSPITAL_COMMUNITY): Payer: Self-pay | Admitting: Obstetrics & Gynecology

## 2012-07-23 ENCOUNTER — Encounter: Payer: Self-pay | Admitting: Obstetrics & Gynecology

## 2012-08-01 ENCOUNTER — Emergency Department: Payer: Self-pay | Admitting: Internal Medicine

## 2012-08-01 LAB — CBC
HCT: 38.8 % (ref 35.0–47.0)
MCH: 30 pg (ref 26.0–34.0)
MCV: 89 fL (ref 80–100)
Platelet: 134 10*3/uL — ABNORMAL LOW (ref 150–440)
RBC: 4.38 10*6/uL (ref 3.80–5.20)
RDW: 13.7 % (ref 11.5–14.5)

## 2012-08-01 LAB — COMPREHENSIVE METABOLIC PANEL
Anion Gap: 3 — ABNORMAL LOW (ref 7–16)
BUN: 16 mg/dL (ref 7–18)
Bilirubin,Total: 0.5 mg/dL (ref 0.2–1.0)
Calcium, Total: 8.7 mg/dL (ref 8.5–10.1)
SGPT (ALT): 12 U/L (ref 12–78)
Sodium: 140 mmol/L (ref 136–145)
Total Protein: 6.8 g/dL (ref 6.4–8.2)

## 2012-08-01 LAB — LIPASE, BLOOD: Lipase: 69 U/L — ABNORMAL LOW (ref 73–393)

## 2012-08-04 ENCOUNTER — Encounter: Payer: Self-pay | Admitting: Family Medicine

## 2012-08-04 ENCOUNTER — Ambulatory Visit (INDEPENDENT_AMBULATORY_CARE_PROVIDER_SITE_OTHER): Payer: Managed Care, Other (non HMO) | Admitting: Family Medicine

## 2012-08-04 VITALS — BP 117/83 | HR 71 | Ht 68.0 in | Wt 157.0 lb

## 2012-08-04 DIAGNOSIS — R102 Pelvic and perineal pain: Secondary | ICD-10-CM

## 2012-08-04 DIAGNOSIS — Z09 Encounter for follow-up examination after completed treatment for conditions other than malignant neoplasm: Secondary | ICD-10-CM

## 2012-08-04 DIAGNOSIS — N949 Unspecified condition associated with female genital organs and menstrual cycle: Secondary | ICD-10-CM

## 2012-08-04 NOTE — Patient Instructions (Signed)
Outpatient Surgery Guidelines, Adult  These are general instructions for patients who will be going home the same day as the procedure (outpatient).  LET YOUR CAREGIVER KNOW ABOUT:   Allergies to food or medicine.   Medicines taken, including vitamins, herbs, eyedrops, over-the-counter medicines, and creams.   Use of steroids (by mouth or creams).   Previous problems with anesthetics or numbing medicines.   History of bleeding problems or blood clots.   Previous surgery.   Other health problems, including diabetes and kidney problems.   Possibility of pregnancy, if this applies.  RISKS AND COMPLICATIONS  Your caregiver will discuss possible risks and complications with you before surgery. Common risks and complications include:    Problems due to anesthesia.   Blood loss and replacement (does not apply to minor surgical procedures).   Temporary increase in pain due to surgery.   Uncorrected pain or problems the surgery was meant to correct.   Infection.   New damage.  BEFORE THE PROCEDURE   Stop taking herbal supplements 2 weeks prior to surgery.   Stop smoking at least 2 weeks prior to surgery. This lowers your risk for complications during and after surgery. Ask your caregiver for help with this if needed.   Do not take aspirin for 1 week prior to surgery unless instructed otherwise by your caregiver.   Do not take anti-inflammatory medicines (such as ibuprofen) for 48 hours prior to surgery.   The day before surgery, eat your usual meals and a light supper. Continue fluid intake. Do not drink alcohol.   Do not eat or drink after midnight before your surgery. Take your usual medicine the morning of surgery with a sip of water unless instructed otherwise. Check with your caregiver if you are unsure.    Arrange for someone to take you home from the hospital and to stay with you for 24 hours after the procedure. Medicine given for your procedure may prevent you from driving a car or caring for yourself.   Call your caregiver's office the morning prior to surgery if you develop an illness or problem which may prevent you from safely having your procedure.   You should be present 60 minutes prior to your procedure or as directed.  AFTER THE PROCEDURE  After surgery, you will be taken to the recovery area where a nurse will monitor your progress. When you are awake, stable, taking fluids well, and there are no complications, you will be allowed to go home. You may have numbness around the surgical site. Healing will take some time. You will have tenderness at the surgical site and there may be some swelling and bruising. You may have some nausea.  HOME CARE INSTRUCTIONS   Do not drive for 24 hours or as instructed by your caregiver. Do not drive while taking prescription pain medicines.   Do not drink alcohol for 24 hours.   Do not make important decisions or sign legal documents for 24 hours.   You may resume a normal diet and activities as directed.   Do not lift anything heavier than 10 pounds (4.5 kg) or play contact sports until your caregiver says it is okay.   Change your bandages (dressings) as directed.   Only take over-the-counter or prescription medicines for pain, discomfort, or fever as directed by your caregiver.   Keep all appointments as scheduled and follow all instructions.   Ask questions if you do not understand something.  SEEK MEDICAL CARE IF:   You   have increased bleeding (more than a small spot) from the surgical site.   You have redness, swelling, or increasing pain in the wound.   You see pus coming from the wound.   You have a fever.   You notice a bad smell coming from the wound or dressing.   You feel lightheaded or faint.  SEEK IMMEDIATE MEDICAL CARE IF:    You develop a rash.   You have trouble breathing.   You develop allergies.  Document Released: 09/15/2000 Document Revised: 03/15/2011 Document Reviewed: 08/03/2010  [employer] Patient Information 2014 [employer].

## 2012-08-05 NOTE — Progress Notes (Signed)
  Subjective:    Patient ID: Debra Hernandez, female    DOB: [birth year], 27 y.o.   MRN: [id number]  HPI  Pt. Underwent laparoscopy, Novasure ablation, lysis of adhesion and bilateral salpingectomy.  She has peri-umbilical pain and more centralized pian.  She is able to get around but finds she is exhausted at 1/2 day and is worried about returning to work.  Review of Systems  Constitutional: Negative for fever and chills.  Gastrointestinal: Negative for nausea, vomiting, diarrhea and constipation.  Genitourinary: Negative for dysuria.       Objective:   Physical Exam  Vitals reviewed. Constitutional: She is oriented to person, place, and time. She appears well-developed and well-nourished.  HENT:  Head: Normocephalic and atraumatic.  Eyes: No scleral icterus.  Neck: Neck supple.  Cardiovascular: Normal rate and regular rhythm.   No murmur heard. Pulmonary/Chest: Effort normal.  Abdominal: Soft. There is tenderness (mild). There is no rebound and no guarding.  Musculoskeletal: Normal range of motion.  Neurological: She is alert and oriented to person, place, and time.  Skin: Skin is warm and dry.  Incisions are well-healed.          Assessment & Plan:

## 2012-08-05 NOTE — Assessment & Plan Note (Signed)
Current pain seems consistent with post-operative pain.  Trial of 1/2 days for next week.

## 2012-08-17 ENCOUNTER — Encounter: Payer: Self-pay | Admitting: Obstetrics & Gynecology

## 2012-08-17 ENCOUNTER — Ambulatory Visit (INDEPENDENT_AMBULATORY_CARE_PROVIDER_SITE_OTHER): Payer: Managed Care, Other (non HMO) | Admitting: Obstetrics & Gynecology

## 2012-08-17 VITALS — BP 110/79 | HR 78 | Ht 67.0 in | Wt 158.0 lb

## 2012-08-17 DIAGNOSIS — N949 Unspecified condition associated with female genital organs and menstrual cycle: Secondary | ICD-10-CM

## 2012-08-17 DIAGNOSIS — R102 Pelvic and perineal pain: Secondary | ICD-10-CM

## 2012-08-17 DIAGNOSIS — Z09 Encounter for follow-up examination after completed treatment for conditions other than malignant neoplasm: Secondary | ICD-10-CM

## 2012-08-17 NOTE — Progress Notes (Signed)
  Subjective:     Debra Hernandez is a 27 y.o. [phone] female who presents to the clinic status post Laparoscopic Bilateral Salpingectomy, Peritoneal Biopsies, Lysis of adhesions, Hysteroscopy, Novasure endometrial ablation and Mirena IUD removal for undesired fertility, chronic pelvic pain, menorrhagia on 07/20/12.  Peritoneal lesions were seen during the procedure concerning for endometriosis but pathology analysis was negative.  Patient reports occasional tingling around umbilical incision, no pain. No other concerns.  The following portions of the patient's history were reviewed and updated as appropriate: allergies, current medications, past family history, past medical history, past social history, past surgical history and problem list. Normal pap in 05/11/12.  Review of Systems Pertinent items are noted in HPI.    Objective:    There were no vitals taken for this visit. General:  cooperative and no distress  Abdomen: soft, bowel sounds active, non-tender, no abnormal masses  Incision:   healing well, no drainage, no erythema, no hernia, no seroma, no swelling, no dehiscence   Pathology (07/20/12)  1. Fallopian tube, bilateral: UNREMARKABLE FALLOPIAN TUBES. NO ENDOMETRIOSIS OR MALIGNANCY. 2. Peritoneum, biopsy: BENIGN PERITONEUM WITH FOCAL CHANGES CONSISTENT WITH ADHESIONS. NO ENDOMETRIOSIS OR MALIGNANCY.   Assessment:    Doing well postoperatively. Operative findings again reviewed. Pathology report discussed.    Plan:   1. Continue any current medications. 2. Wound care discussed. 3. Activity restrictions: none 4. Anticipated return to work: now. 5. Follow up: As needed

## 2012-08-17 NOTE — Patient Instructions (Signed)
Return to clinic for any scheduled appointments or for any gynecologic concerns as needed.   

## 2012-10-17 IMAGING — US ABDOMEN ULTRASOUND LIMITED
1 series · 17 of 18 positions shown · non-contrast
Comparison: none

REASON FOR EXAM: episodic, crampy pain, R upper, pt is 29 wks pregnant
COMMENTS:   May transport without cardiac monitor

[Series 1: abdomen ultrasound limited · 17 of 18 slices shown]
[im 1/18]
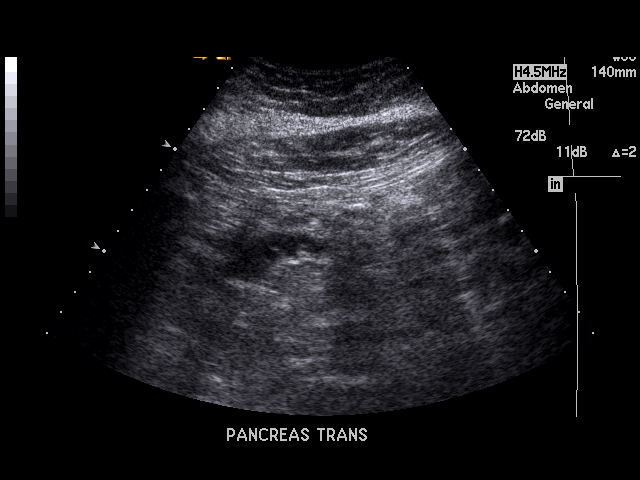
[im 2/18]
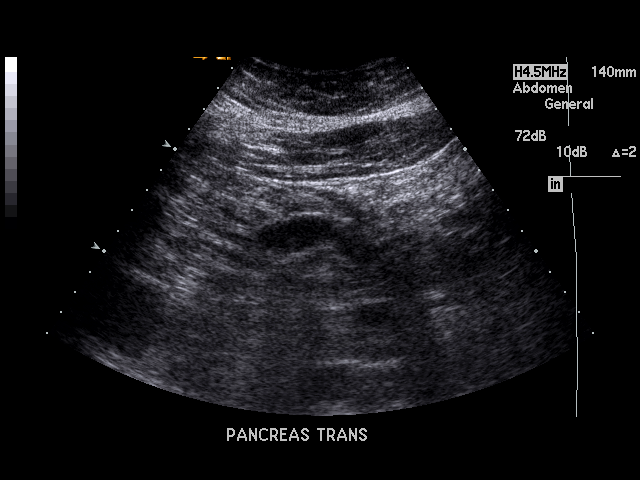
[im 3/18]
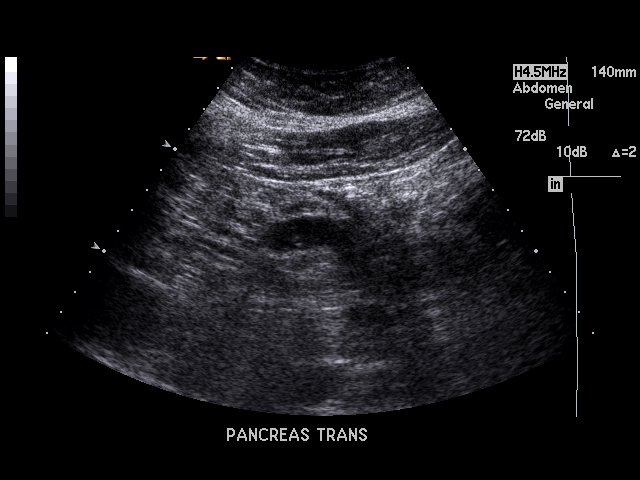
[im 4/18]
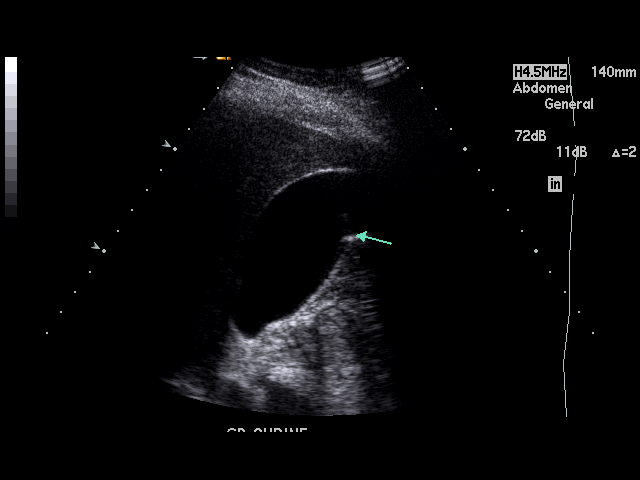
[im 5/18]
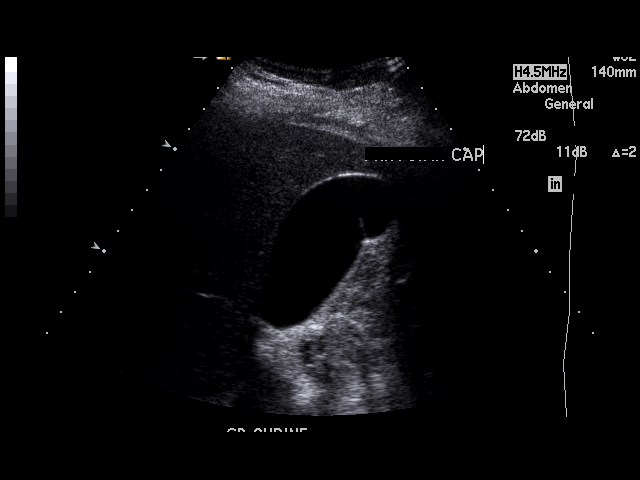
[im 6/18]
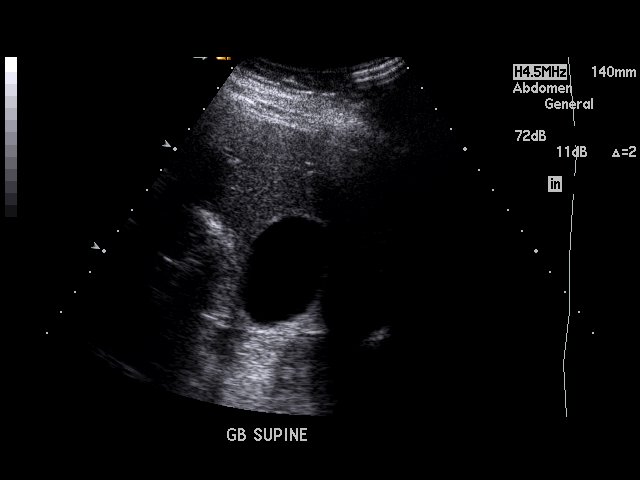
[im 7/18]
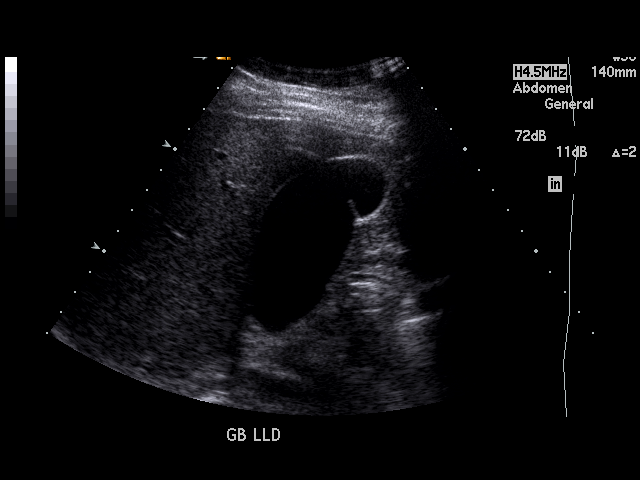
[im 8/18]
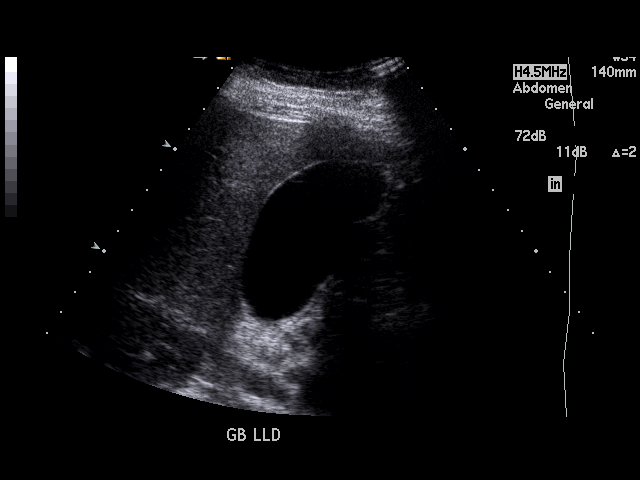
[im 10/18]
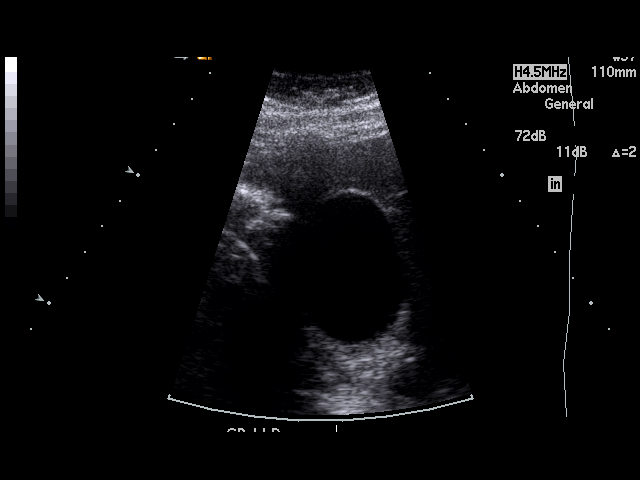
[im 11/18]
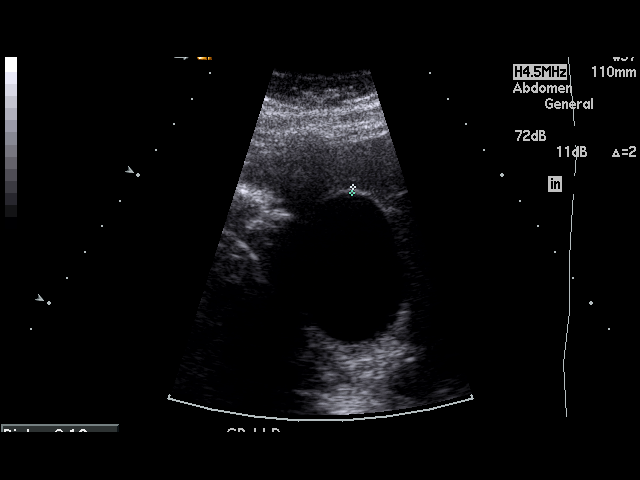
[im 12/18]
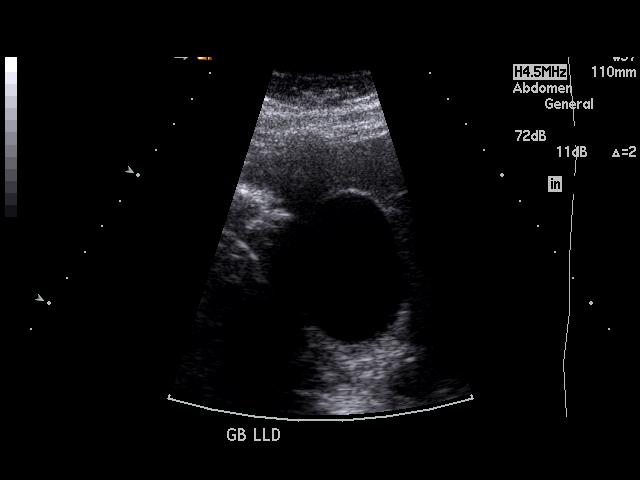
[im 13/18]
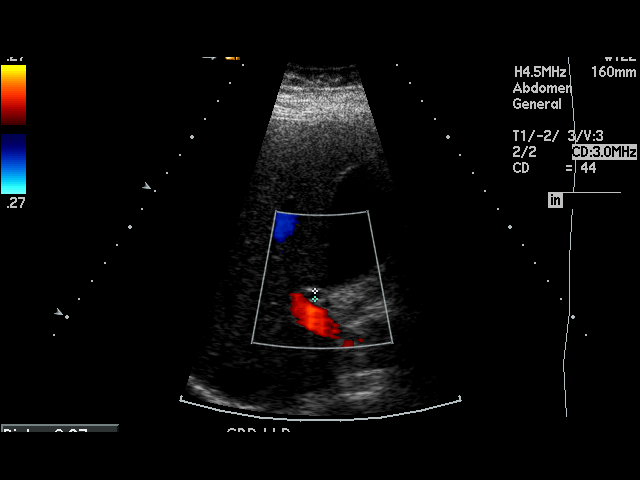
[im 14/18]
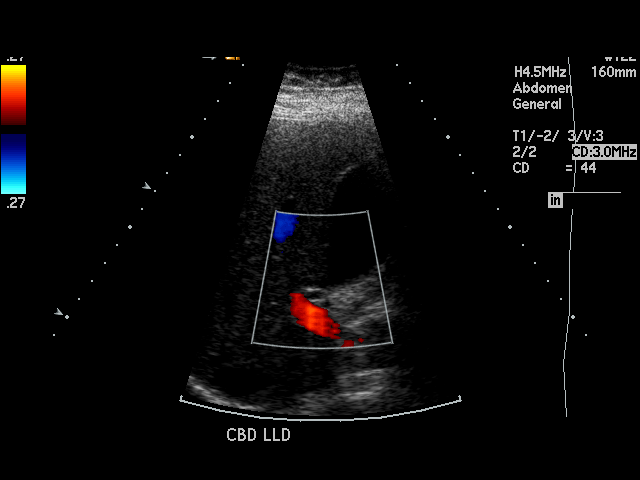
[im 15/18]
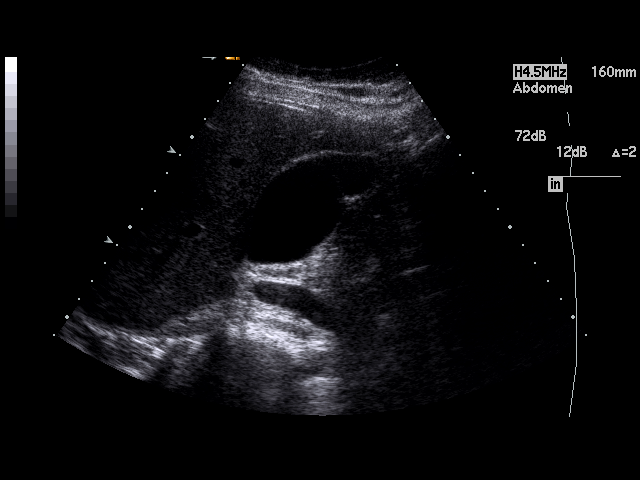
[im 16/18]
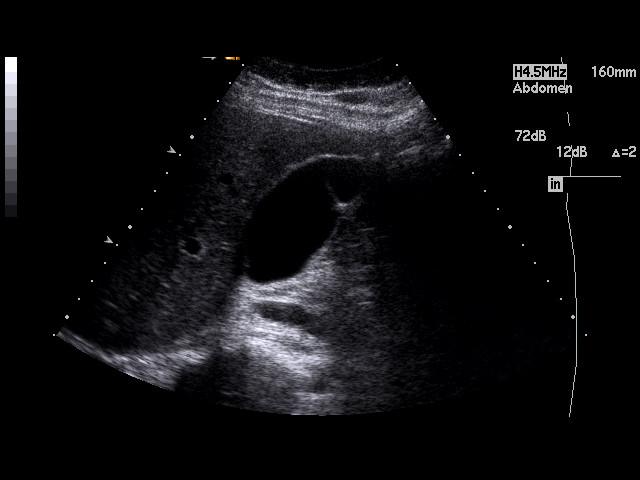
[im 17/18]
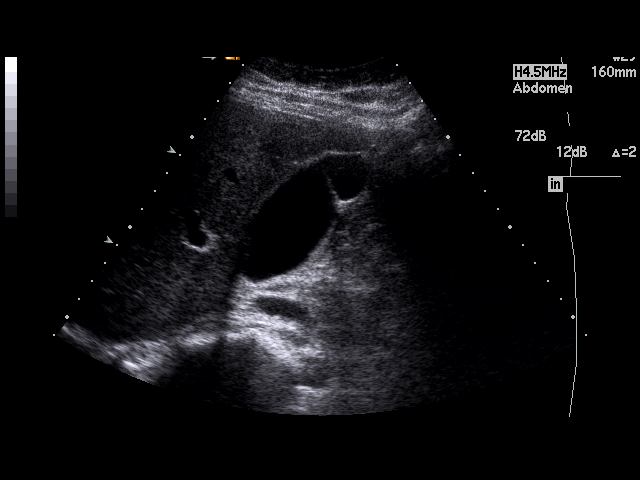
[im 18/18]
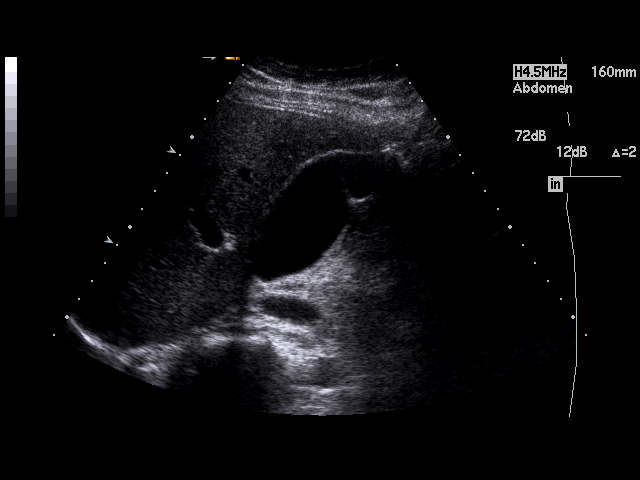

[17 of 18 positions shown; findings below may reference images not displayed]

PROCEDURE:     US  - US ABDOMEN LIMITED SURVEY  - November 25, 2010  [DATE]

RESULT:     Limited examination was performed for evaluation of the
gallbladder and biliary system. No gallstones are seen. There is no
thickening of the gallbladder wall. The common bile duct measures 3.7 mm in
diameter which is within normal limits. No dilated intrahepatic ducts are
seen. The porta hepatis shows no significant abnormalities. The pancreas is
not visualized adequately for evaluation.
IMPRESSION: 1.  No gallstones or other acute changes identified.
2.  The pancreas is not well seen.
3.  Note is made that reportedly the patient is 29 weeks pregnant.

## 2012-11-09 ENCOUNTER — Other Ambulatory Visit: Payer: Self-pay

## 2012-11-20 ENCOUNTER — Telehealth: Payer: Self-pay | Admitting: *Deleted

## 2012-11-20 NOTE — Telephone Encounter (Signed)
Patient is having first heavy period since her ablation was done in late July.  She has had some bleeding but not this heavy and not as painful as this has been.  She will monitor the bleeding and pain and keep in touch with [place] to let us know how things go.  She will utilize ibuprofen and heating pad and call if the pain changes or worsens.

## 2012-12-18 ENCOUNTER — Telehealth: Payer: Self-pay | Admitting: *Deleted

## 2012-12-18 NOTE — Telephone Encounter (Signed)
Patient wanted to call to let us know the bleeding is still present, not as bad as before but the cramping is worse.  The bleeding is not regular at all either.  Bleeding usually lasts for a week off and on and then light and then bad again.  She also has had increased appetite.

## 2013-01-04 DIAGNOSIS — Z1371 Encounter for nonprocreative screening for genetic disease carrier status: Secondary | ICD-10-CM

## 2013-01-04 HISTORY — DX: Encounter for nonprocreative screening for genetic disease carrier status: Z13.71

## 2013-01-04 HISTORY — PX: ABDOMINAL HYSTERECTOMY: SHX81

## 2013-01-16 ENCOUNTER — Telehealth: Payer: Self-pay | Admitting: *Deleted

## 2013-01-16 NOTE — Telephone Encounter (Signed)
Patient is calling to report that she is bleeding again.  Her bleeding and cramping has returned and she would like [place] to make note in her chart of the frequency.

## 2013-02-08 ENCOUNTER — Ambulatory Visit: Payer: Managed Care, Other (non HMO) | Admitting: Obstetrics & Gynecology

## 2013-03-06 ENCOUNTER — Telehealth: Payer: Self-pay | Admitting: *Deleted

## 2013-03-06 NOTE — Telephone Encounter (Signed)
Patient is calling to let us know that she is bleeding again, it usually comes around the 14th but this time it started yesterday a little and today she is having heavy bleeding, changing a pad every 2 hours and it is very clotty dark blood.  Very clumpy in nature.  It is also making her feel nauseated.  I have advised her to come back in to be evaluated.

## 2013-03-12 ENCOUNTER — Encounter: Payer: Self-pay | Admitting: Family Medicine

## 2013-03-12 ENCOUNTER — Ambulatory Visit (INDEPENDENT_AMBULATORY_CARE_PROVIDER_SITE_OTHER): Payer: Managed Care, Other (non HMO) | Admitting: Family Medicine

## 2013-03-12 VITALS — BP 104/82 | HR 74 | Ht 68.0 in | Wt 168.0 lb

## 2013-03-12 DIAGNOSIS — N920 Excessive and frequent menstruation with regular cycle: Secondary | ICD-10-CM

## 2013-03-12 MED ORDER — NORETHINDRONE-ETH ESTRADIOL 0.4-35 MG-MCG PO TABS: 1.0000 | ORAL_TABLET | Freq: Every day | ORAL | Status: DC

## 2013-03-12 MED ORDER — VALACYCLOVIR HCL 1 G PO TABS: 1000.0000 mg | ORAL_TABLET | Freq: Two times a day (BID) | ORAL | Status: DC

## 2013-03-12 NOTE — Progress Notes (Signed)
    Subjective:    Patient ID: Debra Hernandez is a 28 y.o. female presenting with Follow-up  on 03/12/2013  HPI: S/p novasure ablation.  Initially went well, but now with increasing bleeding and clots and pain and pressure.  Having vaginal pain with intercourse. She initially improved a lot after ablation, but bleeding has returned and pain is unbearable.  Was noted to likely have endometriosis, although biopsy didn't prove this, appearance was typical. Has tried and failed Depo, OCP's and Mirena and now conservative approach.  She has long fh of hysterectomy following completion of child bearing and endometriosis.  Review of Systems  Constitutional: Negative for fever and chills.  Respiratory: Negative for shortness of breath.   Cardiovascular: Negative for chest pain.  Gastrointestinal: Negative for nausea, vomiting and abdominal pain.  Genitourinary: Negative for dysuria.  Skin: Negative for rash.      Objective:    BP 104/82  Pulse 74  Ht 5\' 8"  (1.727 m)  Wt 168 lb (76.204 kg)  BMI 25.55 kg/m2  LMP 03/05/2013 Physical Exam  Constitutional: She is oriented to person, place, and time. She appears well-developed and well-nourished. No distress.  HENT:  Head: Normocephalic and atraumatic.  Eyes: No scleral icterus.  Neck: Neck supple.  Cardiovascular: Normal rate.   Pulmonary/Chest: Effort normal.  Abdominal: Soft.  Neurological: She is alert and oriented to person, place, and time.  Skin: Skin is warm and dry.  Psychiatric: She has a normal mood and affect.        Assessment & Plan:   Menorrhagia After discussion of risks and benefits and alternatives, the patient is interested in hysterectomy.  She is only 27 and we discussed leaving in at least one ovary.  Will attempt LAVH, given 2 prior C-sections and probable endometriosis. Risks include but are not limited to bleeding, infection, injury to surrounding structures, including bowel, bladder and ureters, blood  clots, and death.  Likelihood of success is high.     Return in about 3 months (around 06/12/2013).For postop check.

## 2013-03-12 NOTE — Patient Instructions (Signed)
Hysterectomy Information  A hysterectomy is a surgery in which your uterus is removed. This surgery may be done to treat various medical problems. After the surgery, you will no longer have menstrual periods. The surgery will also make you unable to become pregnant (sterile). The fallopian tubes and ovaries can be removed (bilateral salpingo-oophorectomy) during this surgery as well.  REASONS FOR A HYSTERECTOMY  Persistent, abnormal bleeding.  Lasting (chronic) pelvic pain or infection.  The lining of the uterus (endometrium) starts growing outside the uterus (endometriosis).  The endometrium starts growing in the muscle of the uterus (adenomyosis).  The uterus falls down into the vagina (pelvic organ prolapse).  Noncancerous growths in the uterus (uterine fibroids) that cause symptoms.  Precancerous cells.  Cervical cancer or uterine cancer. TYPES OF HYSTERECTOMIES  Supracervical hysterectomy In this type, the top part of the uterus is removed, but not the cervix.  Total hysterectomy The uterus and cervix are removed.  Radical hysterectomy The uterus, the cervix, and the fibrous tissue that holds the uterus in place in the pelvis (parametrium) are removed. WAYS A HYSTERECTOMY CAN BE PERFORMED  Abdominal hysterectomy A large surgical cut (incision) is made in the abdomen. The uterus is removed through this incision.  Vaginal hysterectomy An incision is made in the vagina. The uterus is removed through this incision. There are no abdominal incisions.  Conventional laparoscopic hysterectomy Three or four small incisions are made in the abdomen. A thin, lighted tube with a camera (laparoscope) is inserted into one of the incisions. Other tools are put through the other incisions. The uterus is cut into small pieces. The small pieces are removed through the incisions, or they are removed through the vagina.  Laparoscopically assisted vaginal hysterectomy (LAVH) Three or four small  incisions are made in the abdomen. Part of the surgery is performed laparoscopically and part vaginally. The uterus is removed through the vagina.  Robot-assisted laparoscopic hysterectomy A laparoscope and other tools are inserted into 3 or 4 small incisions in the abdomen. A computer-controlled device is used to give the surgeon a 3D image and to help control the surgical instruments. This allows for more precise movements of surgical instruments. The uterus is cut into small pieces and removed through the incisions or removed through the vagina. RISKS AND COMPLICATIONS  Possible complications associated with this procedure include:  Bleeding and risk of blood transfusion. Tell your health care provider if you do not want to receive any blood products.  Blood clots in the legs or lung.  Infection.  Injury to surrounding organs.  Problems or side effects related to anesthesia.  Conversion to an abdominal hysterectomy from one of the other techniques. WHAT TO EXPECT AFTER A HYSTERECTOMY  You will be given pain medicine.  You will need to have someone with you for the first 3 5 days after you go home.  You will need to follow up with your surgeon in 2 4 weeks after surgery to evaluate your progress.  You may have early menopause symptoms such as hot flashes, night sweats, and insomnia.  If you had a hysterectomy for a problem that was not cancer or not a condition that could lead to cancer, then you no longer need Pap tests. However, even if you no longer need a Pap test, a regular exam is a good idea to make sure no other problems are starting. Document Released: 06/16/2000 Document Revised: 10/11/2012 Document Reviewed: 08/28/2012 [facility] Patient Information 2014 [place].

## 2013-03-13 LAB — CBC
HCT: 40.8 % (ref 34.0–46.6)
Hemoglobin: 13.2 g/dL (ref 11.1–15.9)
MCH: 30 pg (ref 26.6–33.0)
MCHC: 32.4 g/dL (ref 31.5–35.7)
MCV: 93 fL (ref 79–97)
Platelets: 239 10*3/uL (ref 150–379)
RBC: 4.4 x10E6/uL (ref 3.77–5.28)
RDW: 12.8 % (ref 12.3–15.4)
WBC: 8.5 10*3/uL (ref 3.4–10.8)

## 2013-03-13 LAB — TSH: TSH: 1.75 u[IU]/mL (ref 0.450–4.500)

## 2013-03-13 NOTE — Assessment & Plan Note (Signed)
After discussion of risks and benefits and alternatives, the patient is interested in hysterectomy.  She is only 27 and we discussed leaving in at least one ovary.  Will attempt LAVH, given 2 prior C-sections and probable endometriosis. Risks include but are not limited to bleeding, infection, injury to surrounding structures, including bowel, bladder and ureters, blood clots, and death.  Likelihood of success is high.

## 2013-04-07 NOTE — H&P (Signed)
Debra Hernandez is an 28 y.o. [phone] Unknown female.   Chief Complaint: Abnormal uterine bleeding  HPI: S/p novasure ablation. Initially went well, but now with increasing bleeding and clots and pain and pressure. Having vaginal pain with intercourse. She initially improved a lot after ablation, but bleeding has returned and pain is unbearable. Was noted to likely have endometriosis, although biopsy didn't prove this, appearance was typical. Has tried and failed Depo, OCP's and Mirena and now conservative approach. She has long fh of hysterectomy following completion of child bearing and endometriosis.   Past Medical History  Diagnosis Date  . Anemia   . Polycystic ovary syndrome   . History of seasonal allergies   . Blood transfusion 5/11    2 units bld transfused at [facility]  . Kidney stone     passsed stone - no surgery required  . Melanoma 2003    on back - used local to removed    Past Surgical History  Procedure Laterality Date  . Cesarean section  05/2009    at 39 wks at [facility],  lived only 12 days  . Laporascopy    . Tonsillectomy    . Tonsillectomy and adenoidectomy    . Diagnostic laparoscopy      ovarian cyst removed  . Wisdom tooth extraction      2 lower extracted  . Svd  04/2006    x 1 - [facility]  . Cesarean section  02/02/2011    Procedure: CESAREAN SECTION;  Surgeon: Donnamae Jude, MD;  Location: [place] [facility];  Service: Gynecology;  Laterality: N/A;  Repeart c/section  . Scar revision  02/02/2011    Procedure: SCAR REVISION;  Surgeon: Donnamae Jude, MD;  Location: [place] [facility];  Service: Gynecology;  Laterality: N/A;  . Laparoscopic bilateral salpingectomy Bilateral 07/20/2012    Procedure: LAPAROSCOPIC BILATERAL SALPINGECTOMY;  Surgeon: Osborne Oman, MD;  Location: [place] [facility];  Service: Gynecology;  Laterality: Bilateral;  . Novasure ablation N/A 07/20/2012    Procedure: NOVASURE ABLATION;  Surgeon: Osborne Oman, MD;  Location: [place]  [facility];  Service: Gynecology;  Laterality: N/A;    Family History  Problem Relation Age of Onset  . Cancer Mother     Ovarian  . Parkinson's disease Mother   . Mental illness Mother   . Cancer Maternal Grandmother     Ovarian  . Parkinson's disease Maternal Grandmother   . Diabetes Maternal Grandfather   . Anesthesia problems Neg Hx   . Hypotension Neg Hx   . Malignant hyperthermia Neg Hx   . Pseudochol deficiency Neg Hx   . Cancer Father     prostate  . Cancer Paternal Aunt 63    Breast   Social History:  reports that she has never smoked. She has never used smokeless tobacco. She reports that she drinks alcohol. She reports that she does not use illicit drugs.  Allergies:  Allergies  Allergen Reactions  . Strattera [Atomoxetine Hcl] Nausea And Vomiting and Other (See Comments)    insomnia   No current facility-administered medications on file prior to encounter.   Current Outpatient Prescriptions on File Prior to Encounter  Medication Sig Dispense Refill  . ferrous sulfate 325 (65 FE) MG tablet Take 325 mg by mouth daily with breakfast.      . ibuprofen (ADVIL,MOTRIN) 600 MG tablet Take 1 tablet (600 mg total) by mouth every 6 (six) hours as needed for pain. For pain  60 tablet  2  . Multiple Vitamin (ONE-A-DAY ESSENTIAL) TABS Take 1 tablet by mouth daily.       . norethindrone-ethinyl estradiol (OVCON-35, 28,) 0.4-35 MG-MCG tablet Take 1 tablet by mouth daily.  3 Package  3  . valACYclovir (VALTREX) 1000 MG tablet Take 1,000 mg by mouth 2 (two) times daily as needed (cold sores).      . valACYclovir (VALTREX) 1000 MG tablet Take 1 tablet (1,000 mg total) by mouth 2 (two) times daily.  30 tablet  2     Pertinent items are noted in HPI.  Height 5\' 8"  (1.727 m), weight 160 lb (72.576 kg). General appearance: alert, cooperative and appears stated age Head: Normocephalic, without obvious abnormality, atraumatic Neck: supple, symmetrical, trachea midline Lungs: clear to  auscultation bilaterally Heart: regular rate and rhythm Abdomen: soft, non-tender; bowel sounds normal; no masses,  no organomegaly Extremities: extremities normal, atraumatic, no cyanosis or edema Pulses: 2+ and symmetric Skin: Skin color, texture, turgor normal. No rashes or lesions Neurologic: Grossly normal   Lab Results  Component Value Date   WBC 8.5 03/12/2013   HGB 13.2 03/12/2013   HCT 40.8 03/12/2013   MCV 93 03/12/2013   PLT 239 03/12/2013   Lab Results  Component Value Date   PREGTESTUR NEGATIVE 07/20/2012     Assessment/Plan Patient Active Problem List   Diagnosis Date Noted  . Sterilization 07/20/2012  . Significan Family History of Ovarian Cancer 05/19/2012  . Chronic Pelvic Pain 05/19/2012  . Menorrhagia 05/19/2012  . Polycystic ovary syndrome    Desires hysterectomy.  Given h/o multiple surgeries and presumed endometriosis and failed Novasure will proceed with LAVH leaving in at least one ovary.  Risks include but are not limited to bleeding, infection, injury to surrounding structures, including bowel, bladder and ureters, blood clots, and death. Likelihood of success is high.  Britton Perkinson S 04/07/2013, 1:50 PM

## 2013-04-10 ENCOUNTER — Encounter (HOSPITAL_COMMUNITY): Payer: Self-pay | Admitting: Pharmacist

## 2013-04-13 ENCOUNTER — Encounter (HOSPITAL_COMMUNITY): Payer: Self-pay

## 2013-04-13 ENCOUNTER — Encounter (HOSPITAL_COMMUNITY)
Admission: RE | Admit: 2013-04-13 | Discharge: 2013-04-13 | Disposition: A | Discharge status: Home / Self Care | Payer: Managed Care, Other (non HMO) | Source: Ambulatory Visit | Attending: Family Medicine | Admitting: Family Medicine

## 2013-04-13 DIAGNOSIS — Z01812 Encounter for preprocedural laboratory examination: Secondary | ICD-10-CM | POA: Insufficient documentation

## 2013-04-13 LAB — CBC
HCT: 39.4 % (ref 36.0–46.0)
Hemoglobin: 13 g/dL (ref 12.0–15.0)
MCH: 30.9 pg (ref 26.0–34.0)
MCHC: 33 g/dL (ref 30.0–36.0)
MCV: 93.6 fL (ref 78.0–100.0)
PLATELETS: 185 10*3/uL (ref 150–400)
RBC: 4.21 MIL/uL (ref 3.87–5.11)
RDW: 12.8 % (ref 11.5–15.5)
WBC: 5 10*3/uL (ref 4.0–10.5)

## 2013-04-13 NOTE — Patient Instructions (Signed)
21 Debra Hernandez  04/13/2013   Your procedure is scheduled on:  04/18/13  Enter through the Main Entrance of [facility] [place] up the phone at the desk and dial [phone].   Call this number if you have problems the morning of surgery: [phone]   Remember:   Do not eat food:After Midnight.  Do not drink clear liquids: After Midnight.  Take these medicines the morning of surgery with A SIP OF WATER: NA   Do not wear jewelry, make-up or nail polish.  Do not wear lotions, powders, or perfumes. You may wear deodorant.  Do not shave 48 hours prior to surgery.  Do not bring valuables to the hospital.  [facility] is not   responsible for any belongings or valuables brought to the hospital.  Contacts, dentures or bridgework may not be worn into surgery.  Leave suitcase in the car. After surgery it may be brought to your room.  For patients admitted to the hospital, checkout time is 11:00 AM the day of              discharge.   Patients discharged the day of surgery will not be allowed to drive             home.  Name and phone number of your driver: NA  Special Instructions:      Please read over the following fact sheets that you were given:   Surgical Site Infection Prevention

## 2013-04-18 ENCOUNTER — Encounter (HOSPITAL_COMMUNITY)
Admission: RE | Disposition: A | Discharge status: Home / Self Care | Payer: Self-pay | Source: Ambulatory Visit | Attending: Family Medicine

## 2013-04-18 ENCOUNTER — Ambulatory Visit (HOSPITAL_COMMUNITY): Payer: Managed Care, Other (non HMO) | Admitting: Certified Registered"

## 2013-04-18 ENCOUNTER — Observation Stay (HOSPITAL_COMMUNITY)
Admission: RE | Admit: 2013-04-18 | Discharge: 2013-04-19 | Disposition: A | Discharge status: Home / Self Care | Payer: Managed Care, Other (non HMO) | Source: Ambulatory Visit | Attending: Family Medicine | Admitting: Family Medicine

## 2013-04-18 ENCOUNTER — Encounter (HOSPITAL_COMMUNITY): Payer: Self-pay | Admitting: Certified Registered"

## 2013-04-18 ENCOUNTER — Encounter (HOSPITAL_COMMUNITY): Payer: Managed Care, Other (non HMO) | Admitting: Certified Registered"

## 2013-04-18 DIAGNOSIS — D649 Anemia, unspecified: Secondary | ICD-10-CM | POA: Insufficient documentation

## 2013-04-18 DIAGNOSIS — E282 Polycystic ovarian syndrome: Secondary | ICD-10-CM | POA: Insufficient documentation

## 2013-04-18 DIAGNOSIS — N920 Excessive and frequent menstruation with regular cycle: Principal | ICD-10-CM

## 2013-04-18 DIAGNOSIS — N938 Other specified abnormal uterine and vaginal bleeding: Secondary | ICD-10-CM | POA: Insufficient documentation

## 2013-04-18 DIAGNOSIS — Z8041 Family history of malignant neoplasm of ovary: Secondary | ICD-10-CM | POA: Insufficient documentation

## 2013-04-18 DIAGNOSIS — N949 Unspecified condition associated with female genital organs and menstrual cycle: Secondary | ICD-10-CM

## 2013-04-18 HISTORY — PX: LAPAROSCOPIC HYSTERECTOMY: SHX1926

## 2013-04-18 SURGERY — HYSTERECTOMY, TOTAL, LAPAROSCOPIC
Anesthesia: General | Site: Abdomen | Laterality: Left

## 2013-04-18 MED ORDER — FENTANYL 10 MCG/ML IV SOLN
INTRAVENOUS | Status: DC
  Administered 2013-04-18: 10 ug via INTRAVENOUS
  Administered 2013-04-18: 6 ug via INTRAVENOUS
  Administered 2013-04-18: 14:00:00 via INTRAVENOUS
  Filled 2013-04-18: qty 50

## 2013-04-18 MED ORDER — DIPHENHYDRAMINE HCL 50 MG/ML IJ SOLN: 12.5000 mg | Freq: Four times a day (QID) | INTRAMUSCULAR | Status: DC | PRN

## 2013-04-18 MED ORDER — ONDANSETRON HCL 4 MG/2ML IJ SOLN
INTRAMUSCULAR | Status: AC
  Filled 2013-04-18: qty 2

## 2013-04-18 MED ORDER — KETOROLAC TROMETHAMINE 30 MG/ML IJ SOLN: 30.0000 mg | Freq: Four times a day (QID) | INTRAMUSCULAR | Status: DC

## 2013-04-18 MED ORDER — HYDROMORPHONE HCL PF 1 MG/ML IJ SOLN
INTRAMUSCULAR | Status: AC
  Administered 2013-04-18: 0.25 mg via INTRAVENOUS
  Filled 2013-04-18: qty 1

## 2013-04-18 MED ORDER — NEOSTIGMINE METHYLSULFATE 1 MG/ML IJ SOLN
INTRAMUSCULAR | Status: DC | PRN
  Administered 2013-04-18: 3 mg via INTRAVENOUS

## 2013-04-18 MED ORDER — NALOXONE HCL 0.4 MG/ML IJ SOLN: 0.4000 mg | INTRAMUSCULAR | Status: DC | PRN

## 2013-04-18 MED ORDER — KETOROLAC TROMETHAMINE 30 MG/ML IJ SOLN: 15.0000 mg | Freq: Once | INTRAMUSCULAR | Status: DC | PRN

## 2013-04-18 MED ORDER — BUPIVACAINE HCL (PF) 0.25 % IJ SOLN
INTRAMUSCULAR | Status: DC | PRN
  Administered 2013-04-18: 4 mL

## 2013-04-18 MED ORDER — LIDOCAINE HCL (CARDIAC) 20 MG/ML IV SOLN
INTRAVENOUS | Status: AC
  Filled 2013-04-18: qty 5

## 2013-04-18 MED ORDER — SCOPOLAMINE 1 MG/3DAYS TD PT72
1.0000 | MEDICATED_PATCH | TRANSDERMAL | Status: DC
  Administered 2013-04-18: 1.5 mg via TRANSDERMAL

## 2013-04-18 MED ORDER — FENTANYL CITRATE 0.05 MG/ML IJ SOLN
INTRAMUSCULAR | Status: DC | PRN
  Administered 2013-04-18 (×5): 50 ug via INTRAVENOUS

## 2013-04-18 MED ORDER — CEFAZOLIN SODIUM-DEXTROSE 2-3 GM-% IV SOLR
2.0000 g | INTRAVENOUS | Status: AC
  Administered 2013-04-18: 2 g via INTRAVENOUS

## 2013-04-18 MED ORDER — LACTATED RINGERS IR SOLN
Status: DC | PRN
  Administered 2013-04-18: 3000 mL

## 2013-04-18 MED ORDER — DEXAMETHASONE SODIUM PHOSPHATE 10 MG/ML IJ SOLN
INTRAMUSCULAR | Status: DC | PRN
  Administered 2013-04-18: 10 mg via INTRAVENOUS

## 2013-04-18 MED ORDER — PROPOFOL 10 MG/ML IV EMUL
INTRAVENOUS | Status: AC
  Filled 2013-04-18: qty 20

## 2013-04-18 MED ORDER — PROPOFOL 10 MG/ML IV BOLUS
INTRAVENOUS | Status: DC | PRN
  Administered 2013-04-18: 150 mg via INTRAVENOUS

## 2013-04-18 MED ORDER — DEXTROSE IN LACTATED RINGERS 5 % IV SOLN: INTRAVENOUS | Status: DC

## 2013-04-18 MED ORDER — CLINDAMYCIN PHOSPHATE 2 % VA CREA
TOPICAL_CREAM | VAGINAL | Status: AC
  Filled 2013-04-18: qty 40

## 2013-04-18 MED ORDER — MENTHOL 3 MG MT LOZG: 1.0000 | LOZENGE | OROMUCOSAL | Status: DC | PRN

## 2013-04-18 MED ORDER — SCOPOLAMINE 1 MG/3DAYS TD PT72
MEDICATED_PATCH | TRANSDERMAL | Status: AC
  Administered 2013-04-18: 1.5 mg via TRANSDERMAL
  Filled 2013-04-18: qty 1

## 2013-04-18 MED ORDER — MEPERIDINE HCL 25 MG/ML IJ SOLN: 6.2500 mg | INTRAMUSCULAR | Status: DC | PRN

## 2013-04-18 MED ORDER — ONDANSETRON HCL 4 MG/2ML IJ SOLN
INTRAMUSCULAR | Status: DC | PRN
  Administered 2013-04-18: 4 mg via INTRAVENOUS

## 2013-04-18 MED ORDER — OXYCODONE-ACETAMINOPHEN 5-325 MG PO TABS: 1.0000 | ORAL_TABLET | ORAL | Status: DC | PRN

## 2013-04-18 MED ORDER — KETOROLAC TROMETHAMINE 30 MG/ML IJ SOLN
INTRAMUSCULAR | Status: DC | PRN
  Administered 2013-04-18: 30 mg via INTRAVENOUS

## 2013-04-18 MED ORDER — KETOROLAC TROMETHAMINE 30 MG/ML IJ SOLN: 30.0000 mg | Freq: Once | INTRAMUSCULAR | Status: DC

## 2013-04-18 MED ORDER — SODIUM CHLORIDE 0.9 % IJ SOLN: 9.0000 mL | INTRAMUSCULAR | Status: DC | PRN

## 2013-04-18 MED ORDER — IBUPROFEN 600 MG PO TABS: 600.0000 mg | ORAL_TABLET | Freq: Four times a day (QID) | ORAL | Status: DC | PRN

## 2013-04-18 MED ORDER — MIDAZOLAM HCL 2 MG/2ML IJ SOLN
INTRAMUSCULAR | Status: DC | PRN
  Administered 2013-04-18: 2 mg via INTRAVENOUS

## 2013-04-18 MED ORDER — GLYCOPYRROLATE 0.2 MG/ML IJ SOLN
INTRAMUSCULAR | Status: AC
  Filled 2013-04-18: qty 3

## 2013-04-18 MED ORDER — PHENYLEPHRINE HCL 10 MG/ML IJ SOLN
INTRAMUSCULAR | Status: DC | PRN
  Administered 2013-04-18: 80 ug via INTRAVENOUS

## 2013-04-18 MED ORDER — LIDOCAINE HCL (CARDIAC) 20 MG/ML IV SOLN
INTRAVENOUS | Status: DC | PRN
  Administered 2013-04-18: 80 mg via INTRAVENOUS

## 2013-04-18 MED ORDER — ROCURONIUM BROMIDE 100 MG/10ML IV SOLN
INTRAVENOUS | Status: DC | PRN
  Administered 2013-04-18: 35 mg via INTRAVENOUS

## 2013-04-18 MED ORDER — DIPHENHYDRAMINE HCL 12.5 MG/5ML PO ELIX: 12.5000 mg | ORAL_SOLUTION | Freq: Four times a day (QID) | ORAL | Status: DC | PRN

## 2013-04-18 MED ORDER — METOCLOPRAMIDE HCL 5 MG/ML IJ SOLN: 10.0000 mg | Freq: Once | INTRAMUSCULAR | Status: DC | PRN

## 2013-04-18 MED ORDER — KETOROLAC TROMETHAMINE 30 MG/ML IJ SOLN
30.0000 mg | Freq: Four times a day (QID) | INTRAMUSCULAR | Status: DC
  Administered 2013-04-18 – 2013-04-19 (×2): 30 mg via INTRAVENOUS
  Filled 2013-04-18 (×2): qty 1

## 2013-04-18 MED ORDER — CEFAZOLIN SODIUM-DEXTROSE 2-3 GM-% IV SOLR
INTRAVENOUS | Status: AC
  Filled 2013-04-18: qty 50

## 2013-04-18 MED ORDER — LACTATED RINGERS IV SOLN
INTRAVENOUS | Status: DC
  Administered 2013-04-18: 125 mL/h via INTRAVENOUS
  Administered 2013-04-18 (×2): via INTRAVENOUS

## 2013-04-18 MED ORDER — FENTANYL CITRATE 0.05 MG/ML IJ SOLN
INTRAMUSCULAR | Status: AC
  Filled 2013-04-18: qty 5

## 2013-04-18 MED ORDER — ONDANSETRON HCL 4 MG/2ML IJ SOLN: 4.0000 mg | Freq: Four times a day (QID) | INTRAMUSCULAR | Status: DC | PRN

## 2013-04-18 MED ORDER — MIDAZOLAM HCL 2 MG/2ML IJ SOLN
INTRAMUSCULAR | Status: AC
  Filled 2013-04-18: qty 2

## 2013-04-18 MED ORDER — DEXAMETHASONE SODIUM PHOSPHATE 10 MG/ML IJ SOLN
INTRAMUSCULAR | Status: AC
  Filled 2013-04-18: qty 1

## 2013-04-18 MED ORDER — GLYCOPYRROLATE 0.2 MG/ML IJ SOLN
INTRAMUSCULAR | Status: DC | PRN
  Administered 2013-04-18: .6 mg via INTRAVENOUS

## 2013-04-18 MED ORDER — HYDROMORPHONE HCL PF 1 MG/ML IJ SOLN
0.2500 mg | INTRAMUSCULAR | Status: DC | PRN
  Administered 2013-04-18 (×2): 0.25 mg via INTRAVENOUS

## 2013-04-18 MED ORDER — BUPIVACAINE-EPINEPHRINE PF 0.25-1:200000 % IJ SOLN
INTRAMUSCULAR | Status: AC
  Filled 2013-04-18: qty 30

## 2013-04-18 MED ORDER — BUPIVACAINE HCL (PF) 0.25 % IJ SOLN
INTRAMUSCULAR | Status: AC
  Filled 2013-04-18: qty 30

## 2013-04-18 MED ORDER — PHENYLEPHRINE 40 MCG/ML (10ML) SYRINGE FOR IV PUSH (FOR BLOOD PRESSURE SUPPORT)
PREFILLED_SYRINGE | INTRAVENOUS | Status: AC
  Filled 2013-04-18: qty 5

## 2013-04-18 MED ORDER — KETOROLAC TROMETHAMINE 30 MG/ML IJ SOLN
INTRAMUSCULAR | Status: AC
  Filled 2013-04-18: qty 1

## 2013-04-18 MED ORDER — ROCURONIUM BROMIDE 100 MG/10ML IV SOLN
INTRAVENOUS | Status: AC
  Filled 2013-04-18: qty 1

## 2013-04-18 MED ORDER — NEOSTIGMINE METHYLSULFATE 1 MG/ML IJ SOLN
INTRAMUSCULAR | Status: AC
  Filled 2013-04-18: qty 1

## 2013-04-18 SURGICAL SUPPLY — 53 items
ADH SKN CLS APL DERMABOND .7 (GAUZE/BANDAGES/DRESSINGS) ×2
BAG SPEC RTRVL LRG 6X4 10 (ENDOMECHANICALS)
BLADE SURG 15 STRL LF C SS BP (BLADE) ×2 IMPLANT
BLADE SURG 15 STRL SS (BLADE) ×4
CATH ROBINSON RED A/P 16FR (CATHETERS) IMPLANT
CHLORAPREP W/TINT 26ML (MISCELLANEOUS) ×4 IMPLANT
CLOSURE WOUND 1/4 X3 (GAUZE/BANDAGES/DRESSINGS)
CLOTH BEACON ORANGE TIMEOUT ST (SAFETY) ×4 IMPLANT
CONT PATH 16OZ SNAP LID 3702 (MISCELLANEOUS) ×4 IMPLANT
COVER TABLE BACK 60X90 (DRAPES) ×4 IMPLANT
DECANTER SPIKE VIAL GLASS SM (MISCELLANEOUS) ×3 IMPLANT
DERMABOND ADVANCED (GAUZE/BANDAGES/DRESSINGS) ×2
DERMABOND ADVANCED .7 DNX12 (GAUZE/BANDAGES/DRESSINGS) ×1 IMPLANT
DEVICE SUTURE ENDOST 10MM (ENDOMECHANICALS) ×3 IMPLANT
ELECT REM PT RETURN 9FT ADLT (ELECTROSURGICAL) ×4
ELECTRODE REM PT RTRN 9FT ADLT (ELECTROSURGICAL) ×2 IMPLANT
ENDOSTITCH 0 SINGLE 48 (SUTURE) ×12 IMPLANT
FILTER SMOKE EVAC LAPAROSHD (FILTER) ×3 IMPLANT
FORCEP LYON DISSECTING 33CM (CUTTING FORCEPS) ×1 IMPLANT
GAUZE PACKING 2X5 YD STRL (GAUZE/BANDAGES/DRESSINGS) IMPLANT
GLOVE BIOGEL PI IND STRL 6.5 (GLOVE) ×2 IMPLANT
GLOVE BIOGEL PI IND STRL 7.0 (GLOVE) ×2 IMPLANT
GLOVE BIOGEL PI INDICATOR 6.5 (GLOVE) ×2
GLOVE BIOGEL PI INDICATOR 7.0 (GLOVE) ×2
GLOVE ECLIPSE 7.0 STRL STRAW (GLOVE) ×8 IMPLANT
GOWN STRL REUS W/TWL LRG LVL3 (GOWN DISPOSABLE) ×20 IMPLANT
NEEDLE INSUFFLATION 120MM (ENDOMECHANICALS) ×3 IMPLANT
NS IRRIG 1000ML POUR BTL (IV SOLUTION) ×4 IMPLANT
PACK LAVH (CUSTOM PROCEDURE TRAY) ×4 IMPLANT
POUCH SPECIMEN RETRIEVAL 10MM (ENDOMECHANICALS) IMPLANT
PROTECTOR NERVE ULNAR (MISCELLANEOUS) ×7 IMPLANT
RETRACTOR STAY HOOK 5MM (MISCELLANEOUS) ×1 IMPLANT
SCISSORS LAP 5X35 DISP (ENDOMECHANICALS) ×1 IMPLANT
SET IRRIG TUBING LAPAROSCOPIC (IRRIGATION / IRRIGATOR) IMPLANT
SOLUTION ELECTROLUBE (MISCELLANEOUS) IMPLANT
STRIP CLOSURE SKIN 1/4X3 (GAUZE/BANDAGES/DRESSINGS) IMPLANT
SUT VIC AB 0 CT1 18XCR BRD8 (SUTURE) ×5 IMPLANT
SUT VIC AB 0 CT1 36 (SUTURE) ×8 IMPLANT
SUT VIC AB 0 CT1 8-18 (SUTURE) ×8
SUT VIC AB 3-0 X1 27 (SUTURE) ×4 IMPLANT
SUT VICRYL 0 UR6 27IN ABS (SUTURE) ×3 IMPLANT
SUT VICRYL 1 TIES 12X18 (SUTURE) ×4 IMPLANT
SUT VICRYL 4-0 PS2 18IN ABS (SUTURE) ×6 IMPLANT
SYR BULB IRRIGATION 50ML (SYRINGE) ×4 IMPLANT
SYRINGE TOOMEY DISP (SYRINGE) ×4 IMPLANT
TIP UTERINE 5.1X6CM LAV DISP (MISCELLANEOUS) ×3 IMPLANT
TOWEL OR 17X24 6PK STRL BLUE (TOWEL DISPOSABLE) ×8 IMPLANT
TRAY FOLEY CATH 14FR (SET/KITS/TRAYS/PACK) ×4 IMPLANT
TROCAR BALLN 12MMX100 BLUNT (TROCAR) ×4 IMPLANT
TROCAR OPTI TIP 5M 100M (ENDOMECHANICALS) ×5 IMPLANT
TROCAR XCEL OPT SLVE 5M 100M (ENDOMECHANICALS) ×3 IMPLANT
WARMER LAPAROSCOPE (MISCELLANEOUS) ×4 IMPLANT
WATER STERILE IRR 1000ML POUR (IV SOLUTION) ×1 IMPLANT

## 2013-04-18 NOTE — Interval H&P Note (Signed)
History and Physical Interval Note:  04/18/2013 8:47 AM  Debra Hernandez  has presented today for surgery, with the diagnosis of cpt 58550 - S/P NovaSure ablation now with increasing bleeding and clots and pain and pressure.  The various methods of treatment have been discussed with the patient and family. After consideration of risks, benefits and other options for treatment, the patient has consented to  Procedure(s): LAPAROSCOPIC ASSISTED VAGINAL HYSTERECTOMY (N/A) as a surgical intervention .  The patient's history has been reviewed, patient examined, no change in status, stable for surgery.  I have reviewed the patient's chart and labs.  Questions were answered to the patient's satisfaction.     Donnamae Jude

## 2013-04-18 NOTE — Anesthesia Preprocedure Evaluation (Signed)
Anesthesia Evaluation  Patient identified by MRN, date of birth, ID band Patient awake    Reviewed: Allergy & Precautions, H&P , NPO status , Patient's Chart, lab work & pertinent test results, reviewed documented beta blocker date and time   History of Anesthesia Complications (+) PONV and history of anesthetic complications  Airway Mallampati: II TM Distance: >3 FB Neck ROM: full    Dental no notable dental hx. (+) Teeth Intact   Pulmonary neg pulmonary ROS,  breath sounds clear to auscultation  Pulmonary exam normal       Cardiovascular negative cardio ROS  Rhythm:regular Rate:Normal     Neuro/Psych negative neurological ROS  negative psych ROS   GI/Hepatic negative GI ROS, Neg liver ROS,   Endo/Other  PCOS  Renal/GU H/o renal stones  Female GU complaint     Musculoskeletal   Abdominal Normal abdominal exam  (+)   Peds  Hematology H/o blood transfusion with c/s twice in past   Anesthesia Other Findings   Reproductive/Obstetrics negative OB ROS                           Anesthesia Physical Anesthesia Plan  ASA: II  Anesthesia Plan: General ETT   Post-op Pain Management:    Induction:   Airway Management Planned:   Additional Equipment:   Intra-op Plan:   Post-operative Plan:   Informed Consent: I have reviewed the patients History and Physical, chart, labs and discussed the procedure including the risks, benefits and alternatives for the proposed anesthesia with the patient or authorized representative who has indicated his/her understanding and acceptance.   Dental Advisory Given  Plan Discussed with: CRNA and Surgeon  Anesthesia Plan Comments:         Anesthesia Quick Evaluation

## 2013-04-18 NOTE — Op Note (Signed)
Preoperative diagnosis: Menorrhagia, Pelvic pain  Postoperative diagnosis: Same  Procedure: Total Laparoscopic Hysterectomy, left salpingectomy  Surgeon: Standley Dakins. Kennon Rounds, M.D.  Asst.: Lavonia Drafts, MD  Anesthesia: Warren Lacy, MD  Findings: Normal appearing uterus, ovaries, absent right tube.  Estimated blood loss: 100 cc  Specimen: Uterus to pathology  Reason for procedure: 28 y.o. [id number] who presents with abnormal bleeding and heavy menstrual bleeding following Novasure ablation with pelvic pain..The patient desired definitive treatment.  Risks of hysterectomy reviewed.  Risks include but are not limited to bleeding, infection, injury to surrounding structures, including bowel, bladder and ureters, blood clots, and death.  Procedure: Patient was taken to the OR where she was placed in dorsal lithotomy in [place]. She was prepped and draped in the usual sterile fashion. A timeout was performed. The patient received 2g of Ancef prior to procedure. The patient had SCDs in place.  A Foley catheter is placed inside her bladder. A speculum was placed inside the vagina. The cervix was visualized and grasped with a single-tooth tenaculum anteriorly. A Rumi was placed through the cervix and tied down and into the uterus for manipulation. The single-tooth tenaculum and speculum were removed from the vagina. Attention was then turned to the abdomen. 3 cc of quarter percent Marcaine were injected at the umbilicus. A knife was used to make an incision supraumbilically.  Veress needle inserted with high pressures noted.  It was removed and the Optiview 11 mm trocar used to enter the peritoneum and a pneumoperitoneum was created. The patient was placed in Trendelenburg. The uterus was lifted up. It was felt safe to start laparoscopic hysterectomy. 1 cc of quarter percent Marcaine were injected in the left and right lower quadrants. Two #5 Excel trochars were placed under direct  visualization. The left fimbriated end of the tube removed from the ovary and brought out through 5 mm port.The uterus was brought up and the Harmonic Scalpel was used to take the tubo-ovarian pedicle and round ligaments bilaterally. A bladder flap was created. The uterine arteries were taken down in multiple bites with the harmonic scalpel. The uterosacral ligaments were taken with the harmonic scalpel. The bladder was taken down. The vagina was opened on the [facility] ring with the Harmonic scalpel.the uterus removed from the vagina.The vaginal cuff was inspected there was minimal bleeding noted. The endoscopic suture was used to close the cuff in running and interupted sutures.  All laparoscopic instruments were removed from the abdomen.  A 3-0 Vicryl suture on an X-1 needle was used for subcuticular closure at the umbilicus and in the left and right lower quadrants. Dermabond was placed over the incisions. Patient was awakened taken to recovery room in stable condition.  Donnamae Jude MD 04/18/2013, 11:21 AM

## 2013-04-18 NOTE — Progress Notes (Signed)
Fentanyl PCA discontinued. 39 mL of Fentanyl wasted in med room sink at 2352.   Witness: Bess Kinds, RN

## 2013-04-18 NOTE — Anesthesia Postprocedure Evaluation (Signed)
Anesthesia Post Note  Patient: Debra Hernandez  Procedure(s) Performed: Procedure(s) (LRB): HYSTERECTOMY TOTAL LAPAROSCOPIC and  left salpingectomy (Left)  Anesthesia type: GA  Patient location: PACU  Post pain: Pain level controlled  Post assessment: Post-op Vital signs reviewed  Last Vitals:  Filed Vitals:   04/18/13 1200  BP: 101/58  Pulse: 72  Temp:   Resp: 16    Post vital signs: Reviewed  Level of consciousness: sedated  Complications: No apparent anesthesia complications

## 2013-04-18 NOTE — Transfer of Care (Signed)
Immediate Anesthesia Transfer of Care Note  Patient: Debra Hernandez  Procedure(s) Performed: Procedure(s): HYSTERECTOMY TOTAL LAPAROSCOPIC and  left salpingectomy (Left)  Patient Location: PACU  Anesthesia Type:General  Level of Consciousness: awake, alert  and oriented  Airway & Oxygen Therapy: Patient Spontanous Breathing and Patient connected to nasal cannula oxygen  Post-op Assessment: Report given to PACU RN, Post -op Vital signs reviewed and stable and Patient moving all extremities  Post vital signs: Reviewed and stable  Complications: No apparent anesthesia complications

## 2013-04-19 ENCOUNTER — Encounter (HOSPITAL_COMMUNITY): Payer: Self-pay | Admitting: Family Medicine

## 2013-04-19 LAB — CBC
HCT: 35.2 % — ABNORMAL LOW (ref 36.0–46.0)
Hemoglobin: 11.4 g/dL — ABNORMAL LOW (ref 12.0–15.0)
MCH: 30.7 pg (ref 26.0–34.0)
MCHC: 32.4 g/dL (ref 30.0–36.0)
MCV: 94.9 fL (ref 78.0–100.0)
Platelets: 142 10*3/uL — ABNORMAL LOW (ref 150–400)
RBC: 3.71 MIL/uL — ABNORMAL LOW (ref 3.87–5.11)
RDW: 12.9 % (ref 11.5–15.5)
WBC: 7.2 10*3/uL (ref 4.0–10.5)

## 2013-04-19 MED ORDER — OXYCODONE-ACETAMINOPHEN 5-325 MG PO TABS: 1.0000 | ORAL_TABLET | ORAL | Status: DC | PRN

## 2013-04-19 NOTE — Discharge Summary (Signed)
Gynecology Physician Discharge Summary  Patient ID: Debra Hernandez MRN: [id number] DOB/AGE: 30-Mar-1985 28 y.o.  Admit date: 04/18/2013 Discharge date: 04/19/2013  Preoperative Diagnoses: Heavy menstrual bleeding Pelvic pain  Procedures: Procedure(s) (LRB): HYSTERECTOMY TOTAL LAPAROSCOPIC and  left salpingectomy (Left)  Results for orders placed during the hospital encounter of 04/13/13 (from the past 336 hour(s))  CBC   Collection Time    04/13/13  9:45 AM      Result Value Ref Range   WBC 5.0  4.0 - 10.5 K/uL   RBC 4.21  3.87 - 5.11 MIL/uL   Hemoglobin 13.0  12.0 - 15.0 g/dL   HCT 39.4  36.0 - 46.0 %   MCV 93.6  78.0 - 100.0 fL   MCH 30.9  26.0 - 34.0 pg   MCHC 33.0  30.0 - 36.0 g/dL   RDW 12.8  11.5 - 15.5 %   Platelets 185  150 - 400 K/uL    Hospital Course:  Debra Hernandez is a 28 y.o. [id number]  admitted for scheduled surgery.  She underwent the procedures as mentioned above, her operation was uncomplicated. For further details about surgery, please refer to the operative report. Patient had an uncomplicated postoperative course. By time of discharge on POD#1, her pain was controlled on oral pain medications; she was ambulating, voiding without difficulty, tolerating regular diet and passing flatus. She was deemed stable for discharge to home.   Discharge Exam: Blood pressure 93/54, pulse 97, temperature 98 F (36.7 C), temperature source Oral, resp. rate 18, height 5\' 8"  (1.727 m), weight 163 lb (73.936 kg), SpO2 99.00%. General appearance: alert and no distress  Resp: normal effort Cardio: Regular rate GI: soft, non-tender; bowel sounds normal; no masses, no organomegaly.  Incisions: C/D/I, no erythema, no drainage noted Pelvic: scant blood on pad  Extremities: extremities normal, atraumatic, no cyanosis or edema and Homans sign is negative, no sign of DVT  Discharged Condition: Stable  Disposition: 01-Home or Self Care  Discharge Orders   Future Orders  Complete By Expires   Call MD for:  persistant nausea and vomiting  As directed    Call MD for:  severe uncontrolled pain  As directed    Call MD for:  temperature >100.4  As directed    Diet - low sodium heart healthy  As directed    Increase activity slowly  As directed    Lifting restrictions  As directed    No dressing needed  As directed    Sexual Activity Restrictions  As directed        Medication List    STOP taking these medications       norethindrone-ethinyl estradiol 0.4-35 MG-MCG tablet  Commonly known as:  OVCON-35,BALZIVA,BRIELLYN      TAKE these medications       ferrous sulfate 325 (65 FE) MG tablet  Take 325 mg by mouth daily with breakfast.     ibuprofen 600 MG tablet  Commonly known as:  ADVIL,MOTRIN  Take 1 tablet (600 mg total) by mouth every 6 (six) hours as needed for pain. For pain     ONE-A-DAY ESSENTIAL Tabs  Take 1 tablet by mouth daily.     oxyCODONE-acetaminophen 5-325 MG per tablet  Commonly known as:  PERCOCET/ROXICET  Take 1-2 tablets by mouth every 4 (four) hours as needed for severe pain (moderate to severe pain (when tolerating fluids)).     polyvinyl alcohol 1.4 % ophthalmic solution  Commonly known as:  LIQUIFILM TEARS  Place 1 drop into both eyes as needed for dry eyes.     valACYclovir 1000 MG tablet  Commonly known as:  VALTREX  Take 1,000 mg by mouth 2 (two) times daily as needed (cold sores).           Follow-up Information   Follow up with [facility] In 2 weeks. (postop check--susan will call you with appointment)    Specialty:  Obstetrics and Gynecology   Contact information:   [place] [address] [phone]      Signed:  Donnamae Jude, MD 04/19/2013 9:30 AM

## 2013-04-19 NOTE — Anesthesia Postprocedure Evaluation (Signed)
  Anesthesia Post-op Note  Patient: Debra Hernandez  Procedure(s) Performed: Procedure(s): HYSTERECTOMY TOTAL LAPAROSCOPIC and  left salpingectomy (Left)  Patient Location: [facility]  Anesthesia Type:General  Level of Consciousness: awake  Airway and Oxygen Therapy: Patient Spontanous Breathing  Post-op Pain: mild  Post-op Assessment: Patient's Cardiovascular Status Stable and Respiratory Function Stable  Post-op Vital Signs: stable  Last Vitals:  Filed Vitals:   04/19/13 0521  BP: 93/54  Pulse: 97  Temp: 36.7 C  Resp: 18    Complications: No apparent anesthesia complications

## 2013-04-19 NOTE — Addendum Note (Signed)
Addendum created 04/19/13 0732 by Ignacia Bayley, CRNA   Modules edited: Notes Section   Notes Section:  File: [id number]

## 2013-04-19 NOTE — Discharge Instructions (Signed)
Laparoscopically Assisted Vaginal Hysterectomy, Care After °Refer to this sheet in the next few weeks. These instructions provide you with information on caring for yourself after your procedure. Your health care provider may also give you more specific instructions. Your treatment has been planned according to current medical practices, but problems sometimes occur. Call your health care provider if you have any problems or questions after your procedure. °WHAT TO EXPECT AFTER THE PROCEDURE °After your procedure, it is typical to have the following: °· Abdominal pain. You will be given pain medicine to control it. °· Sore throat from the breathing tube that was inserted during surgery. °HOME CARE INSTRUCTIONS °· Only take over-the-counter or prescription medicines for pain, discomfort, or fever as directed by your health care provider. °· Do not take aspirin. It can cause bleeding. °· Do not drive when taking pain medicine. °· Follow your health care provider's advice regarding diet, exercise, lifting, driving, and general activities. °· Resume your usual diet as directed and allowed. °· Get plenty of rest and sleep. °· Do not douche, use tampons, or have sexual intercourse for at least 6 weeks, or until your health care provider gives you permission. °· Change your bandages (dressings) as directed by your health care provider. °· Monitor your temperature and notify your health care provider of a fever. °· Take showers instead of baths for 2 3 weeks. °· Do not drink alcohol until your health care provider gives you permission. °· If you develop constipation, you may take a mild laxative with your health care provider's permission. Bran foods may help with constipation problems. Drinking enough fluids to keep your urine clear or pale yellow may help as well. °· Try to have someone home with you for 1 2 weeks to help around the house. °· Keep all of your follow-up appointments as directed by your health care  provider. °SEEK MEDICAL CARE IF:  °· You have swelling, redness, or increasing pain around your incision sites. °· You have pus coming from your incision. °· You notice a bad smell coming from your incision. °· Your incision breaks open. °· You feel dizzy or lightheaded. °· You have pain or bleeding when you urinate. °· You have persistent diarrhea. °· You have persistent nausea and vomiting. °· You have abnormal vaginal discharge. °· You have a rash. °· You have any type of abnormal reaction or develop an allergy to your medicine. °· You have poor pain control with your prescribed medicine. °SEEK IMMEDIATE MEDICAL CARE IF:  °· You have a fever. °· You have severe abdominal pain. °· You have chest pain. °· You have shortness of breath. °· You faint. °· You have pain, swelling, or redness in your leg. °· You have heavy vaginal bleeding with blood clots. °Document Released: 12/10/2010 Document Revised: 08/23/2012 Document Reviewed: 07/06/2012 °ExitCare® Patient Information ©2014 [employer]. ° °

## 2013-04-19 NOTE — Progress Notes (Signed)
UR completed 

## 2013-05-02 ENCOUNTER — Encounter: Payer: Managed Care, Other (non HMO) | Admitting: Family Medicine

## 2013-05-02 ENCOUNTER — Encounter: Payer: Self-pay | Admitting: Family Medicine

## 2013-05-02 ENCOUNTER — Ambulatory Visit (INDEPENDENT_AMBULATORY_CARE_PROVIDER_SITE_OTHER): Payer: Managed Care, Other (non HMO) | Admitting: Family Medicine

## 2013-05-02 VITALS — BP 102/68 | HR 93 | Ht 68.0 in | Wt 167.0 lb

## 2013-05-02 DIAGNOSIS — Z09 Encounter for follow-up examination after completed treatment for conditions other than malignant neoplasm: Secondary | ICD-10-CM

## 2013-05-02 NOTE — Progress Notes (Signed)
    Subjective:    Patient ID: Debra Hernandez is a 28 y.o. female presenting with Routine Post Op  on 05/02/2013  HPI: S/p TLH.  Doing well. Some bleeding.  No pathology results found--supposed to be done at [facility]. Urination and BM are WNL.  Some pain, normal healing.  Review of Systems  Constitutional: Negative for fever and chills.  Respiratory: Negative for shortness of breath.   Gastrointestinal: Negative for abdominal pain.  Genitourinary: Positive for pelvic pain.  Skin: Negative for rash.      Objective:    BP 102/68  Pulse 93  Ht 5\' 8"  (1.727 m)  Wt 167 lb (75.751 kg)  BMI 25.40 kg/m2  LMP 03/23/2013 Physical Exam  Vitals reviewed. Constitutional: She appears well-developed and well-nourished. No distress.  Cardiovascular: Normal rate.   Pulmonary/Chest: Effort normal.  Abdominal: Soft. There is no tenderness.  incisions: Well healed. Vaginal cuff with suture visible and intact, no mass      Assessment & Plan:   Follow-up examination, following unspecified surgery doing well--attempting to retrieve her pathology results.  Will call [facility] and investigate through the OR.  Return in about 4 weeks (around 05/30/2013).

## 2013-05-02 NOTE — Patient Instructions (Signed)
Post-operative Instructions after Hysterectomy  Activity   Try to increase activity level slowly each day.  You may climb stairs. Go slowly at first.  No heavy lifting (>25 pounds) for at least four weeks.  Heavy household chores like vacuuming, mopping, pushing furniture, and activities that require repetitive reaching should be avoided.  You can cook if it doesn't make you uncomfortable.  You can do laundry, just be careful when taking items in and out of the washer/dryer.  Do not lift a heavy laundry basket.  You can go grocery shopping if it doesn't make you too tired.  In general, you can bend and you can lift light objects but you must listen to your body.  If it hurts, don't do it.  You can drive in 1-61 days after surgery.  This is not a rule but a guideline. If you feel you can can comfortably sit behind the wheel, turn your head, and hit the breakes hard, then you can drive.  If you are taking narcotic pain medication during the day, then you SHOULD NOT DRIVE.  Nothing in the vagina for eight weeks, including tampons, douching, or engaging in sexual activity.  You can shower the day you get home from the hospital. No tub baths for one week.  Pain Management  You will have a prescription for medication containing narcotics, either Vicodin 5/500 or Percocet 5/325. You can take 2 tablets every 4-6 hours as needed for pain.  Only take this when you are hurting, not just every four hours because the directions say you can.  You should find that each day the pain is improved and that you can take less and/or spread out the time between doses.  You will also be given a prescription for Motrin 800 mg or advised to use over the counter Motrin 200 mg.  Generic Ibuprofen is the same as Motrin.  You can take 600 mg (or 4-200 mg tablets) every 6 with hours. Alternating this with your narcotic pain medication will help you use less pain medication.  You should not exceed taking 2000 mg of  Tylenol. This equals either four Vicodin or six Percocet.  You may find that for a day or two you require more medication than stated but this should not continue for several days.  If it does, please call the office so your medication can be changed.  Most narcotic pain medications require a hand written prescription.  If we need to change your medication, enough time is needed for a family member or friend to get to the office to pick up the prescription.  You can use a heating pad. This is especially good for pelvic cramping.  Incision Care  Your incision will either have little small paper strips on the (Steri-strips) or have skin super glue (Dermabond).  Do not pick these off.  They will get loose over the first two weeks after surgery.  When they are about to fall off, you can then remove them.  Use soap and water only to clean the incisions. An antibacterial soap like Dial is good to use.   Once the strips or glue comes off , you can use Neosporin, if you like.  After about three weeks, if you want to use something to help the scars go away faster; you may use Mederma or Vitamin E.  Use a small amount twice daily on the scars and rub it in good.  If you have any redness or draining from the incision  that makes you think there is infection, pleas call the office.  Bowel Movements  If the constipation is severe, you can progress to using Miralax (an over the counter product that you mix with a small amount of water) or use an enema.  DO NOT USE ANY LAXATIVES.  If you have questions, call the office.  Vaginal Care  You do not need to do anything special.  Just shower normally .  Expect spotting or light bleeding. Lonia Blood is different for everyone and can be as little as spotting for a couple of days after surgery or spotting/light bleeding for weeks.  Do not use tampons.  You will need to wear a mini pad.  Important Reasons to Call the Office  Fever > 100.5. This is a real  post-operative temperature and you will need to be seen.  Heavy vaginal bleeding like a period.  Unusual vaginal discharge or odor.  Redness or drainage from your incisions that look like infection.

## 2013-05-03 ENCOUNTER — Telehealth: Payer: Self-pay | Admitting: *Deleted

## 2013-05-03 NOTE — Progress Notes (Signed)
This encounter was created in error - please disregard.

## 2013-05-03 NOTE — Telephone Encounter (Signed)
Message copied by Erik Obey on Thu May 03, 2013  4:55 PM ------      Message from: Donnamae Jude      Created: Thu May 03, 2013  3:57 PM       Here is her path--hospital policy is that path cannot be sent to outside lab--tell her about inflammation in cervix--may make her day       ----- Message -----         From: [facility]         Sent: 05/02/2013   2:28 PM           To: Donnamae Jude, MD                   ------

## 2013-05-03 NOTE — Telephone Encounter (Signed)
Patient has been notified of results.  

## 2013-05-18 ENCOUNTER — Telehealth: Payer: Self-pay | Admitting: *Deleted

## 2013-05-18 DIAGNOSIS — B9689 Other specified bacterial agents as the cause of diseases classified elsewhere: Secondary | ICD-10-CM

## 2013-05-18 DIAGNOSIS — N760 Acute vaginitis: Principal | ICD-10-CM

## 2013-05-18 MED ORDER — METRONIDAZOLE 500 MG PO TABS: 500.0000 mg | ORAL_TABLET | Freq: Two times a day (BID) | ORAL | Status: DC

## 2013-05-18 NOTE — Telephone Encounter (Signed)
Patient is having increased vaginal discharge that is yellow in color and irritating.  She has had BV in the past and feels like this is what is going on.  She is headed out of town today and would like medicine called in.  She will keep in touch if she needs to come in next week.

## 2013-05-29 ENCOUNTER — Ambulatory Visit (INDEPENDENT_AMBULATORY_CARE_PROVIDER_SITE_OTHER): Payer: Managed Care, Other (non HMO) | Admitting: Family Medicine

## 2013-05-29 ENCOUNTER — Encounter: Payer: Self-pay | Admitting: Family Medicine

## 2013-05-29 VITALS — BP 107/71 | HR 76 | Ht 68.0 in | Wt 173.0 lb

## 2013-05-29 DIAGNOSIS — Z09 Encounter for follow-up examination after completed treatment for conditions other than malignant neoplasm: Secondary | ICD-10-CM

## 2013-05-29 DIAGNOSIS — L918 Other hypertrophic disorders of the skin: Secondary | ICD-10-CM

## 2013-05-29 NOTE — Progress Notes (Signed)
    Subjective:    Patient ID: Debra Hernandez is a 28 y.o. female presenting with Routine Post Op  on 05/29/2013  HPI: 6 wks post op from [facility].  Doing well--some mild umbilical pain.  Nml BM's.  Some vaginal discharge. Not yet sexually active.  Desires return to work on 6/8.  Review of Systems  Constitutional: Negative for fever, chills and fatigue.  Gastrointestinal: Positive for abdominal pain (mild).  Genitourinary: Positive for vaginal discharge. Negative for vaginal pain.  Skin: Negative for rash.      Objective:    BP 107/71  Pulse 76  Ht 5\' 8"  (1.727 m)  Wt 173 lb (78.472 kg)  BMI 26.31 kg/m2  LMP 03/23/2013 Physical Exam  Constitutional: She appears well-developed and well-nourished.  HENT:  Head: Normocephalic and atraumatic.  Eyes: No scleral icterus.  Neck: Neck supple.  Cardiovascular: Normal rate.   Pulmonary/Chest: Effort normal.  Abdominal: Soft.  Genitourinary:  Granulation tissue noted at vaginal apex--treated with AgNO3.        Assessment & Plan:   Follow-up examination, following unspecified surgery  Return in about 1 week (around 06/05/2013), or if symptoms worsen or fail to improve. Will recheck granulation    Tissue and treat again if necessary.

## 2013-05-29 NOTE — Patient Instructions (Signed)
Total Laparoscopic Hysterectomy A total laparoscopic hysterectomy is a minimally invasive surgery to remove your uterus and cervix. This surgery is performed by making several small cuts (incisions) in your abdomen. It can also be done with a thin, lighted tube (laparoscope) inserted into two small incisions in your lower abdomen. Your fallopian tubes and ovaries can be removed (bilateral salpingo-oophorectomy) during this surgery as well.Benefits of minimally invasive surgery include:  Less pain.  Less risk of blood loss.  Less risk of infection.  Quicker return to normal activities. LET [facility] CARE PROVIDER KNOW ABOUT:  Any allergies you have.  All medicines you are taking, including vitamins, herbs, eye drops, creams, and over-the-counter medicines.  Previous problems you or members of your family have had with the use of anesthetics.  Any blood disorders you have.  Previous surgeries you have had.  Medical conditions you have. RISKS AND COMPLICATIONS  Generally, this is a safe procedure. However, as with any procedure, complications can occur. Possible complications include:  Bleeding.  Blood clots in the legs or lung.  Infection.  Injury to surrounding organs.  Problems with anesthesia.  Early menopause symptoms (hot flashes, night sweats, insomnia).  Risk of conversion to an open abdominal incision. BEFORE THE PROCEDURE  Ask your health care provider about changing or stopping your regular medicines.  Do not take aspirin or blood thinners (anticoagulants) for 1 week before the surgery or as told by your health care provider.  Do not eat or drink anything for 8 hours before the surgery or as told by your health care provider.  Quit smoking if you smoke.  Arrange for a ride home after surgery and for someone to help you at home during recovery. PROCEDURE   You will be given antibiotic medicine.  An IV tube will be placed in your arm. You will be given  medicine to make you sleep (general anesthetic).  A gas (carbon dioxide) will be used to inflate your abdomen. This will allow your surgeon to look inside your abdomen, perform your surgery, and treat any other problems found if necessary.  Three or four small incisions (often less than 1/2 inch) will be made in your abdomen. One of these incisions will be made in the area of your belly button (navel). The laparoscope will be inserted into the incision. Your surgeon will look through the laparoscope while doing your procedure.  Other surgical instruments will be inserted through the other incisions.  Your uterus may be removed through your vagina or cut into small pieces and removed through the small incisions.  Your incisions will be closed. AFTER THE PROCEDURE  The gas will be released from inside your abdomen.  You will be taken to the recovery area where a nurse will watch and check your progress. Once you are awake, stable, and taking fluids well, without other problems, you will return to your room or be allowed to go home.  There is usually minimal discomfort following the surgery because the incisions are so small.  You will be given pain medicine while you are in the hospital and for when you go home. Document Released: 10/18/2006 Document Revised: 08/23/2012 Document Reviewed: 07/11/2012 [facility] Patient Information 2014 [place], [place].

## 2013-06-05 ENCOUNTER — Ambulatory Visit (INDEPENDENT_AMBULATORY_CARE_PROVIDER_SITE_OTHER): Payer: Managed Care, Other (non HMO) | Admitting: Family Medicine

## 2013-06-05 ENCOUNTER — Encounter: Payer: Self-pay | Admitting: Family Medicine

## 2013-06-05 VITALS — BP 96/68 | HR 79 | Ht 68.0 in | Wt 173.0 lb

## 2013-06-05 DIAGNOSIS — L918 Other hypertrophic disorders of the skin: Secondary | ICD-10-CM

## 2013-06-05 DIAGNOSIS — N898 Other specified noninflammatory disorders of vagina: Secondary | ICD-10-CM

## 2013-06-05 NOTE — Patient Instructions (Signed)
Hysterectomy °Care After °Refer to this sheet in the next few weeks. These instructions provide you with information on caring for yourself after your procedure. Your caregiver may also give you more specific instructions. Your treatment has been planned according to current medical practices, but problems sometimes occur. Call your caregiver if you have any problems or questions after your procedure. °HOME CARE INSTRUCTIONS  °Healing will take time. You may have discomfort, tenderness, swelling, and bruising at the surgical site for about 2 weeks. This is normal and will get better as time goes on. °· Only take over-the-counter or prescription medicines for pain, discomfort, or fever as directed by your caregiver. °· Do not take aspirin. It can cause bleeding. °· Do not drive when taking pain medicine. °· Follow your caregiver's advice regarding exercise, lifting, driving, and general activities. °· Resume your usual diet as directed and allowed. °· Get plenty of rest and sleep. °· Do not douche, use tampons, or have sexual intercourse for at least 6 weeks or until your caregiver gives you permission. °· Change your bandages (dressings) as directed by your caregiver. °· Monitor your temperature. °· Take showers instead of baths for 2 to 3 weeks. °· Do not drink alcohol until your caregiver gives you permission. °· If you are constipated, you may take a mild laxative with your caregiver's permission. Bran foods may help with constipation problems. Drinking enough fluids to keep your urine clear or pale yellow may help as well. °· Try to have someone home with you for 1 or 2 weeks to help around the house. °· Keep all of your follow-up appointments as directed by your caregiver. °SEEK MEDICAL CARE IF:  °· You have swelling, redness, or increasing pain in the surgical cut (incision) area. °· You have pus coming from the incision. °· You notice a bad smell coming from the incision or dressing. °· You have swelling,  redness, or pain around the intravenous (IV) site. °· Your incision breaks open. °· You feel dizzy or lightheaded. °· You have pain or bleeding when you urinate. °· You have persistent diarrhea. °· You have persistent nausea and vomiting. °· You have abnormal vaginal discharge. °· You have a rash. °· You have any type of abnormal reaction or develop an allergy to your medicine. °· Your pain is not controlled with your prescribed medicine. °SEEK IMMEDIATE MEDICAL CARE IF:  °· You have a fever. °· You have severe abdominal pain. °· You have chest pain. °· You have shortness of breath. °· You faint. °· You have pain, swelling, or redness of your leg. °· You have heavy vaginal bleeding with blood clots. °MAKE SURE YOU: °· Understand these instructions. °· Will watch your condition. °· Will get help right away if you are not doing well or get worse. °Document Released: 07/10/2004 Document Revised: 03/15/2011 Document Reviewed: 08/07/2010 °ExitCare® Patient Information ©2014 [employer]. ° °

## 2013-06-05 NOTE — Assessment & Plan Note (Signed)
Appears much smaller today. Hopefully this will be last treatment necessary.

## 2013-06-05 NOTE — Progress Notes (Signed)
    Subjective:    Patient ID: Debra Hernandez is a 28 y.o. female presenting with Routine Post Op  on 06/05/2013  HPI: Here after treatment last week for granulation tissue.  Review of Systems  Constitutional: Negative for fever and chills.  Gastrointestinal: Negative for nausea and vomiting.  Genitourinary: Negative for dysuria, vaginal discharge and pelvic pain.      Objective:    BP 96/68  Pulse 79  Ht 5\' 8"  (1.727 m)  Wt 173 lb (78.472 kg)  BMI 26.31 kg/m2  LMP 03/23/2013 Physical Exam  Constitutional: She appears well-developed and well-nourished. No distress.  HENT:  Head: Normocephalic.  Cardiovascular: Normal rate.   Pulmonary/Chest: Effort normal.  Abdominal: Soft.  Genitourinary:  Small area of granulation tissue noted.  Treated with AgNO3.        Assessment & Plan:  Granulation tissue at vaginal vault  Appears much smaller today. Hopefully this will be last treatment necessary.     Return in about 1 week (around 06/12/2013).

## 2013-06-14 ENCOUNTER — Encounter: Payer: Self-pay | Admitting: Family Medicine

## 2013-06-14 ENCOUNTER — Ambulatory Visit (INDEPENDENT_AMBULATORY_CARE_PROVIDER_SITE_OTHER): Payer: Managed Care, Other (non HMO) | Admitting: Family Medicine

## 2013-06-14 VITALS — BP 109/78 | HR 79 | Ht 68.0 in | Wt 176.0 lb

## 2013-06-14 DIAGNOSIS — L918 Other hypertrophic disorders of the skin: Secondary | ICD-10-CM

## 2013-06-14 DIAGNOSIS — N898 Other specified noninflammatory disorders of vagina: Secondary | ICD-10-CM

## 2013-06-14 DIAGNOSIS — N899 Noninflammatory disorder of vagina, unspecified: Secondary | ICD-10-CM

## 2013-06-14 NOTE — Assessment & Plan Note (Signed)
S/p treatment again.

## 2013-06-14 NOTE — Patient Instructions (Signed)
Supracervical Hysterectomy, Care After Refer to this sheet in the next few weeks. These instructions provide you with information on caring for yourself after your procedure. Your health care provider may also give you more specific instructions. Your treatment has been planned according to current medical practices, but problems sometimes occur. Call your health care provider if you have any problems or questions after your procedure.  WHAT TO EXPECT AFTER THE PROCEDURE After your procedure, it is typical to have some discomfort, tenderness, swelling, and bruising at the surgical sites. This normally lasts for about 2 weeks.  HOME CARE INSTRUCTIONS   Get plenty of rest and sleep.  Only take over-the-counter or prescription medicines as directed by your health care provider.  Do not take aspirin. It can cause bleeding.  Do not drive until your health care provider approves.  Follow your health care provider's advice regarding exercise, lifting, and general activities.  Resume your usual diet as directed by your health care provider.  Do not douche, use tampons, or have sexual intercourse for at least 6 weeks or until your health care provider gives you permission.  Change your bandages (dressings) only as directed by your health care provider.  Monitor your temperature.  Take showers instead of baths for 2 3 weeks or as directed by your health care provider.  Drink enough fluids to keep your urine clear or pale yellow.  Do not drink alcohol until your health care provider gives you permission.  If you are constipated, you may take a mild laxative if your health care provider approves. Bran foods may also help with constipation problems.  Try to have someone home with you for 1 2 weeks to help with activities.  Follow up with your health care provider as directed. SEEK MEDICAL CARE IF:  You have swelling, redness, or increasing pain in the incision area.  You have pus coming  from an incision.  You notice a bad smell coming from the incision or dressing.  You have swelling, redness, or pain in the area around the IV site.  Your incision breaks open.  You feel dizzy or lightheaded.  You have pain or bleeding when you urinate.  You have persistent diarrhea.  You have persistent nausea and vomiting.  You have abnormal vaginal discharge.  You have a rash.  Your pain is not controlled with your prescribed medicine. SEEK IMMEDIATE MEDICAL CARE IF:  You have a fever.  You have severe abdominal pain.  You have chest pain.  You have shortness of breath.  You faint.  You have pain, swelling, or redness in your leg.  You have heavy vaginal bleeding with blood clots. Document Released: 10/11/2012 Document Reviewed: 06/23/2012 [facility] Patient Information 2014 [place].

## 2013-06-14 NOTE — Progress Notes (Signed)
    Subjective:    Patient ID: Debra Hernandez is a 28 y.o. female presenting with Routine Post Op  on 06/14/2013  HPI: Returns.  Treating granulation tissue at cuff. Still with vaginal discharge.  S/p treatment x 2 with AgNO3.  Review of Systems  Constitutional: Negative for fever and chills.  Respiratory: Negative for shortness of breath.   Cardiovascular: Negative for chest pain.  Gastrointestinal: Negative for nausea, vomiting and abdominal pain.  Genitourinary: Positive for vaginal discharge. Negative for dysuria.  Skin: Negative for rash.      Objective:    BP 109/78  Pulse 79  Ht 5\' 8"  (1.727 m)  Wt 176 lb (79.833 kg)  BMI 26.77 kg/m2  LMP 03/23/2013 Physical Exam  Constitutional: She is oriented to person, place, and time. She appears well-developed and well-nourished. No distress.  HENT:  Head: Normocephalic and atraumatic.  Cardiovascular: Normal rate.   Pulmonary/Chest: Effort normal.  Abdominal: Soft.  Genitourinary:  Small amount of granulation tissue noted at top of cuff.  Treated with AgNO3.  Neurological: She is alert and oriented to person, place, and time.        Assessment & Plan:   Granulation tissue at vaginal vault S/p treatment again.      Return in about 1 week (around 06/21/2013).

## 2013-06-19 ENCOUNTER — Ambulatory Visit (INDEPENDENT_AMBULATORY_CARE_PROVIDER_SITE_OTHER): Payer: Managed Care, Other (non HMO) | Admitting: Family Medicine

## 2013-06-19 ENCOUNTER — Encounter: Payer: Self-pay | Admitting: Family Medicine

## 2013-06-19 VITALS — BP 114/69 | HR 80 | Ht 68.0 in | Wt 177.0 lb

## 2013-06-19 DIAGNOSIS — N898 Other specified noninflammatory disorders of vagina: Secondary | ICD-10-CM

## 2013-06-19 DIAGNOSIS — L918 Other hypertrophic disorders of the skin: Secondary | ICD-10-CM

## 2013-06-19 DIAGNOSIS — N899 Noninflammatory disorder of vagina, unspecified: Secondary | ICD-10-CM

## 2013-06-19 NOTE — Progress Notes (Signed)
    Subjective:    Patient ID: Debra Hernandez is a 28 y.o. female presenting with Wound Check  on 06/19/2013  HPI: Here for f/u of granulation tissue at cuff.  Has had AgNO3 applied x 3.  No vaginal discharge.  Review of Systems  Constitutional: Negative for fever and chills.  Respiratory: Negative for shortness of breath.   Cardiovascular: Negative for chest pain.  Gastrointestinal: Negative for nausea, vomiting and abdominal pain.  Genitourinary: Negative for dysuria.  Skin: Negative for rash.      Objective:    BP 114/69  Pulse 80  Ht 5\' 8"  (1.727 m)  Wt 177 lb (80.287 kg)  BMI 26.92 kg/m2  LMP 03/23/2013 Physical Exam  Vitals reviewed. Constitutional: She appears well-developed and well-nourished.  Pulmonary/Chest: Effort normal.  Genitourinary: No vaginal discharge found.  Small amount of granulation tissue noted at top of vagina.  Cauterized with AgNO3.  Musculoskeletal: She exhibits no edema.        Assessment & Plan:   Granulation tissue at vaginal vault Continues to improve.    Return in about 1 week (around 06/26/2013) for a follow-up.

## 2013-06-19 NOTE — Assessment & Plan Note (Signed)
Continues to improve 

## 2013-06-22 ENCOUNTER — Encounter: Payer: Self-pay | Admitting: *Deleted

## 2013-06-26 ENCOUNTER — Encounter: Payer: Self-pay | Admitting: Obstetrics & Gynecology

## 2013-06-26 ENCOUNTER — Ambulatory Visit (INDEPENDENT_AMBULATORY_CARE_PROVIDER_SITE_OTHER): Payer: Managed Care, Other (non HMO) | Admitting: Obstetrics & Gynecology

## 2013-06-26 VITALS — BP 115/89 | HR 74 | Ht 68.0 in | Wt 174.0 lb

## 2013-06-26 DIAGNOSIS — L918 Other hypertrophic disorders of the skin: Secondary | ICD-10-CM

## 2013-06-26 DIAGNOSIS — N898 Other specified noninflammatory disorders of vagina: Secondary | ICD-10-CM

## 2013-06-26 NOTE — Progress Notes (Signed)
  Subjective:   Debra Hernandez is a 28 y.o. female presenting for treatment of granulation tissue s/p TLH on 04/18/13  HPI:  Here for f/u of granulation tissue at cuff. Has had silver nitrate applied x 4. Small amount of vaginal discharge after application.   Review of Systems  Constitutional: Negative for fever and chills.  Respiratory: Negative for shortness of breath.  Cardiovascular: Negative for chest pain.  Gastrointestinal: Negative for nausea, vomiting and abdominal pain.  Genitourinary: Negative for dysuria.  Skin: Negative for rash.   Objective:   BP 115/89  Pulse 74  Ht 5\' 8"  (1.727 m)  Wt 174 lb (78.926 kg)  BMI 26.46 kg/m2  LMP 03/23/2013 Physical Exam  Vitals reviewed.  Constitutional: She appears well-developed and well-nourished.  Pulmonary/Chest: Effort normal.  Genitourinary: No vaginal discharge found.  Small amount (~14mm) of granulation tissue noted at top of vagina on R side of vaginal cuff. Cauterized with silver nitrate  Musculoskeletal: She exhibits no edema.   Assessment & Plan:   Granulation tissue at vaginal vault  Continues to improve.  Return in about 1 week  for a follow-up.  Verita Schneiders, MD, [place] Attending [place], [facility]

## 2013-06-26 NOTE — Patient Instructions (Signed)
Return to clinic for any scheduled appointments or for any gynecologic concerns as needed.   

## 2013-07-03 ENCOUNTER — Encounter: Payer: Self-pay | Admitting: Family Medicine

## 2013-07-03 ENCOUNTER — Ambulatory Visit (INDEPENDENT_AMBULATORY_CARE_PROVIDER_SITE_OTHER): Payer: Managed Care, Other (non HMO) | Admitting: Family Medicine

## 2013-07-03 VITALS — BP 99/72 | HR 69 | Ht 68.0 in | Wt 176.0 lb

## 2013-07-03 DIAGNOSIS — N898 Other specified noninflammatory disorders of vagina: Secondary | ICD-10-CM

## 2013-07-03 NOTE — Progress Notes (Signed)
    Subjective:    Patient ID: Debra Hernandez is a 28 y.o. female presenting with Follow-up  on 07/03/2013  HPI: Returns for treatment of granulation tissue at top of vaginal cuff after TLH. Treated x 5 with AgNO3.  Review of Systems  Constitutional: Negative for fever and chills.  Respiratory: Negative for shortness of breath.   Cardiovascular: Negative for leg swelling.  Gastrointestinal: Negative for abdominal pain.      Objective:    BP 99/72  Pulse 69  Ht 5\' 8"  (1.727 m)  Wt 176 lb (79.833 kg)  BMI 26.77 kg/m2  LMP 03/23/2013 Physical Exam  Constitutional: She appears well-developed and well-nourished. No distress.  Cardiovascular: Normal rate.   Pulmonary/Chest: Effort normal.  Abdominal: Soft.        Assessment & Plan:  Granulation tissue at vaginal vault Continued improvement.    Return in about 1 week (around 07/10/2013).

## 2013-07-03 NOTE — Assessment & Plan Note (Signed)
Continued improvement.

## 2013-07-03 NOTE — Patient Instructions (Signed)
Total Laparoscopic Hysterectomy °A total laparoscopic hysterectomy is a minimally invasive surgery to remove your uterus and cervix. This surgery is performed by making several small cuts (incisions) in your abdomen. It can also be done with a thin, lighted tube (laparoscope) inserted into two small incisions in your lower abdomen. Your fallopian tubes and ovaries can be removed (bilateral salpingo-oophorectomy) during this surgery as well. Benefits of minimally invasive surgery include: °· Less pain. °· Less risk of blood loss. °· Less risk of infection. °· Quicker return to normal activities. °LET YOUR HEALTH CARE PROVIDER KNOW ABOUT: °· Any allergies you have. °· All medicines you are taking, including vitamins, herbs, eye drops, creams, and over-the-counter medicines. °· Previous problems you or members of your family have had with the use of anesthetics. °· Any blood disorders you have. °· Previous surgeries you have had. °· Medical conditions you have. °RISKS AND COMPLICATIONS  °Generally, this is a safe procedure. However, as with any procedure, complications can occur. Possible complications include: °· Bleeding. °· Blood clots in the legs or lung. °· Infection. °· Injury to surrounding organs. °· Problems with anesthesia. °· Early menopause symptoms (hot flashes, night sweats, insomnia). °· Risk of conversion to an open abdominal incision. °BEFORE THE PROCEDURE °· Ask your health care provider about changing or stopping your regular medicines. °· Do not take aspirin or blood thinners (anticoagulants) for 1 week before the surgery or as told by your health care provider. °· Do not eat or drink anything for 8 hours before the surgery or as told by your health care provider. °· Quit smoking if you smoke. °· Arrange for a ride home after surgery and for someone to help you at home during recovery. °PROCEDURE  °· You will be given antibiotic medicine. °· An IV tube will be placed in your arm. You will be given  medicine to make you sleep (general anesthetic). °· A gas (carbon dioxide) will be used to inflate your abdomen. This will allow your surgeon to look inside your abdomen, perform your surgery, and treat any other problems found if necessary. °· Three or four small incisions (often less than 1/2 inch) will be made in your abdomen. One of these incisions will be made in the area of your belly button (navel). The laparoscope will be inserted into the incision. Your surgeon will look through the laparoscope while doing your procedure. °· Other surgical instruments will be inserted through the other incisions. °· Your uterus may be removed through your vagina or cut into small pieces and removed through the small incisions. °· Your incisions will be closed. °AFTER THE PROCEDURE °· The gas will be released from inside your abdomen. °· You will be taken to the recovery area where a nurse will watch and check your progress. Once you are awake, stable, and taking fluids well, without other problems, you will return to your room or be allowed to go home. °· There is usually minimal discomfort following the surgery because the incisions are so small. °· You will be given pain medicine while you are in the hospital and for when you go home. °Document Released: 10/18/2006 Document Revised: 08/23/2012 Document Reviewed: 07/11/2012 °Exit[employer]® Patient Information ©2015 [employer]. This information is not intended to replace advice given to you by your health care provider. Make sure you discuss any questions you have with your health care provider. ° °

## 2013-07-10 ENCOUNTER — Ambulatory Visit (INDEPENDENT_AMBULATORY_CARE_PROVIDER_SITE_OTHER): Payer: Managed Care, Other (non HMO) | Admitting: Family Medicine

## 2013-07-10 ENCOUNTER — Encounter: Payer: Self-pay | Admitting: Family Medicine

## 2013-07-10 VITALS — BP 92/67 | HR 69 | Ht 68.0 in | Wt 175.0 lb

## 2013-07-10 DIAGNOSIS — L918 Other hypertrophic disorders of the skin: Secondary | ICD-10-CM

## 2013-07-10 DIAGNOSIS — N898 Other specified noninflammatory disorders of vagina: Secondary | ICD-10-CM

## 2013-07-10 NOTE — Progress Notes (Signed)
    Subjective:    Patient ID: Debra Hernandez is a 28 y.o. female presenting with Follow-up  on 07/10/2013  HPI: Returns for treatment of granulation tissue at vaginal cuff.  Review of Systems  Constitutional: Negative for fever.  Gastrointestinal: Negative for abdominal pain.  Genitourinary: Negative for vaginal discharge.      Objective:    BP 92/67  Pulse 69  Ht 5\' 8"  (1.727 m)  Wt 175 lb (79.379 kg)  BMI 26.61 kg/m2  LMP 03/23/2013 Physical Exam  Vitals reviewed. Pulmonary/Chest: Effort normal.  Abdominal: Soft.  Genitourinary:  Small amount of granulation tissue at top of cuff.  Procedure: Treated with AGNO3 x 2.      Assessment & Plan:   Granulation tissue at vaginal vault Continue weekly treatment with AgNO3.    Return in about 1 week (around 07/17/2013).

## 2013-07-10 NOTE — Assessment & Plan Note (Signed)
Continue weekly treatment with AgNO3.

## 2013-07-18 ENCOUNTER — Ambulatory Visit (INDEPENDENT_AMBULATORY_CARE_PROVIDER_SITE_OTHER): Payer: Managed Care, Other (non HMO) | Admitting: Family Medicine

## 2013-07-18 ENCOUNTER — Encounter: Payer: Self-pay | Admitting: Family Medicine

## 2013-07-18 VITALS — BP 100/72 | HR 69 | Ht 68.0 in | Wt 176.4 lb

## 2013-07-18 DIAGNOSIS — N898 Other specified noninflammatory disorders of vagina: Secondary | ICD-10-CM

## 2013-07-18 NOTE — Assessment & Plan Note (Signed)
Now resolved.  

## 2013-07-18 NOTE — Patient Instructions (Signed)
Total Laparoscopic Hysterectomy, Care After °Refer to this sheet in the next few weeks. These instructions provide you with information on caring for yourself after your procedure. Your health care provider may also give you more specific instructions. Your treatment has been planned according to current medical practices, but problems sometimes occur. Call your health care provider if you have any problems or questions after your procedure. °WHAT TO EXPECT AFTER THE PROCEDURE °· Pain and bruising at the incision sites. You will be given pain medicine to control it. °· Menopausal symptoms such as hot flashes, night sweats, and insomnia if your ovaries were removed. °· Sore throat from the breathing tube that was inserted during surgery. °HOME CARE INSTRUCTIONS °· Only take over-the-counter or prescription medicines for pain, discomfort, or fever as directed by your health care provider.   °· Do not take aspirin. It can cause bleeding.   °· Do not drive when taking pain medicine.   °· Follow your health care provider's advice regarding diet, exercise, lifting, driving, and general activities.   °· Resume your usual diet as directed and allowed.   °· Get plenty of rest and sleep.   °· Do not douche, use tampons, or have sexual intercourse for at least 6 weeks, or until your health care provider gives you permission.   °· Change your bandages (dressings) as directed by your health care provider.   °· Monitor your temperature and notify your health care provider of a fever.   °· Take showers instead of baths for 2-3 weeks.   °· Do not drink alcohol until your health care provider gives you permission.   °· If you develop constipation, you may take a mild laxative with your health care provider's permission. Bran foods may help with constipation problems. Drinking enough fluids to keep your urine clear or pale yellow may help as well.   °· Try to have someone home with you for 1-2 weeks to help around the house.    °· Keep all of your follow-up appointments as directed by your health care provider.   °SEEK MEDICAL CARE IF: °· You have swelling, redness, or increasing pain around your incision sites.   °· You have pus coming from your incision.   °· You notice a bad smell coming from your incision.   °· Your incision breaks open.   °· You feel dizzy or lightheaded.   °· You have pain or bleeding when you urinate.   °· You have persistent diarrhea.   °· You have persistent nausea and vomiting.   °· You have abnormal vaginal discharge.   °· You have a rash.   °· You have any type of abnormal reaction or develop an allergy to your medicine.   °· You have poor pain control with your prescribed medicine.   °SEEK IMMEDIATE MEDICAL CARE IF: °· You have chest pain or shortness of breath. °· You have severe abdominal pain that is not relieved with pain medicine. °· You have pain or swelling in your legs. °MAKE SURE YOU: °· Understand these instructions. °· Will watch your condition. °· Will get help right away if you are not doing well or get worse. °Document Released: 10/11/2012 Document Revised: 12/26/2012 Document Reviewed: 10/11/2012 °ExitCare® Patient Information ©2015 [employer]. This information is not intended to replace advice given to you by your health care provider. Make sure you discuss any questions you have with your health care provider. ° °

## 2013-07-18 NOTE — Progress Notes (Signed)
    Subjective:    Patient ID: Debra Hernandez is a 28 y.o. female presenting with Follow-up  on 07/18/2013  HPI: Here for f/u of granulation tissue at top of the vaginal vault.  S/p multiple treatments with AgNO3. No concerns.  Review of Systems  Constitutional: Negative for fever and chills.  Gastrointestinal: Negative for abdominal pain.  Genitourinary: Negative for vaginal discharge.      Objective:    BP 100/72  Pulse 69  Ht 5\' 8"  (1.727 m)  Wt 176 lb 6.4 oz (80.015 kg)  BMI 26.83 kg/m2  LMP 03/23/2013 Physical Exam  Vitals reviewed. Constitutional: She appears well-developed and well-nourished.  Cardiovascular: Normal rate.   Pulmonary/Chest: Effort normal.  Abdominal: Soft.  Genitourinary: Vagina normal.  No evidence of granulation tissue        Assessment & Plan:  Granulation tissue at vaginal vault Now resolved.     Return if symptoms worsen or fail to improve.

## 2013-07-27 ENCOUNTER — Telehealth: Payer: Self-pay | Admitting: *Deleted

## 2013-07-27 DIAGNOSIS — N760 Acute vaginitis: Principal | ICD-10-CM

## 2013-07-27 DIAGNOSIS — B9689 Other specified bacterial agents as the cause of diseases classified elsewhere: Secondary | ICD-10-CM

## 2013-07-27 MED ORDER — METRONIDAZOLE 500 MG PO TABS: 500.0000 mg | ORAL_TABLET | Freq: Two times a day (BID) | ORAL | Status: DC

## 2013-07-27 NOTE — Telephone Encounter (Signed)
Patient is having a bacterial vaginosis  Infection and she would like metronidazole called in for her.

## 2013-08-29 LAB — LIPID PANEL
Cholesterol: 144 mg/dL (ref 0–200)
HDL: 54 mg/dL (ref 35–70)
LDL Cholesterol: 78 mg/dL
TRIGLYCERIDES: 62 mg/dL (ref 40–160)

## 2013-08-29 LAB — TSH: TSH: 1.18 u[IU]/mL (ref <=5.90)

## 2013-08-29 LAB — CBC AND DIFFERENTIAL
HEMATOCRIT: 40 % (ref 36–46)
Hemoglobin: 13.1 g/dL (ref 12.0–16.0)
Platelets: 181 10*3/uL (ref 150–399)
WBC: 5.2 10^3/mL

## 2013-08-29 LAB — HEMOGLOBIN A1C: HEMOGLOBIN A1C: 5.3 % (ref 4.0–6.0)

## 2013-08-29 LAB — BASIC METABOLIC PANEL
BUN: 17 mg/dL (ref 4–21)
Creatinine: 0.7 mg/dL (ref <=1.1)
Glucose: 87 mg/dL
Potassium: 4.3 mmol/L (ref 3.4–5.3)
SODIUM: 138 mmol/L (ref 137–147)

## 2013-08-29 LAB — HEPATIC FUNCTION PANEL
ALT: 8 U/L (ref 7–35)
AST: 11 U/L — AB (ref 13–35)

## 2013-11-05 ENCOUNTER — Encounter: Payer: Self-pay | Admitting: Family Medicine

## 2014-02-19 ENCOUNTER — Telehealth: Payer: Self-pay | Admitting: *Deleted

## 2014-02-19 DIAGNOSIS — N760 Acute vaginitis: Principal | ICD-10-CM

## 2014-02-19 DIAGNOSIS — B9689 Other specified bacterial agents as the cause of diseases classified elsewhere: Secondary | ICD-10-CM

## 2014-02-19 MED ORDER — METRONIDAZOLE 500 MG PO TABS: 500.0000 mg | ORAL_TABLET | Freq: Two times a day (BID) | ORAL | Status: DC

## 2014-02-19 NOTE — Telephone Encounter (Signed)
Patient is having a recurrence of BV and would like medication refilled.

## 2014-03-01 ENCOUNTER — Ambulatory Visit (INDEPENDENT_AMBULATORY_CARE_PROVIDER_SITE_OTHER): Payer: Managed Care, Other (non HMO) | Admitting: Obstetrics & Gynecology

## 2014-03-01 ENCOUNTER — Encounter: Payer: Self-pay | Admitting: Obstetrics & Gynecology

## 2014-03-01 VITALS — BP 111/70 | HR 87 | Ht 68.0 in | Wt 172.0 lb

## 2014-03-01 DIAGNOSIS — Z113 Encounter for screening for infections with a predominantly sexual mode of transmission: Secondary | ICD-10-CM

## 2014-03-01 DIAGNOSIS — F32A Depression, unspecified: Secondary | ICD-10-CM

## 2014-03-01 DIAGNOSIS — F329 Major depressive disorder, single episode, unspecified: Secondary | ICD-10-CM

## 2014-03-01 DIAGNOSIS — R1031 Right lower quadrant pain: Secondary | ICD-10-CM

## 2014-03-01 LAB — POCT URINALYSIS DIPSTICK
BILIRUBIN UA: NEGATIVE
Blood, UA: NEGATIVE
Glucose, UA: NEGATIVE
Ketones, UA: NEGATIVE
LEUKOCYTES UA: NEGATIVE
NITRITE UA: NEGATIVE
PH UA: 5.5
PROTEIN UA: NEGATIVE
Spec Grav, UA: 1.015
Urobilinogen, UA: NEGATIVE

## 2014-03-01 NOTE — Patient Instructions (Signed)
Return to clinic for any scheduled appointments or for any gynecologic concerns as needed.   

## 2014-03-01 NOTE — Progress Notes (Signed)
   CLINIC ENCOUNTER NOTE  History:  29 y.o. [id number] here today for evaluation of RLQ pain for several weeks. 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  Pathology negative for endometriosis.  She has had multiple other abdominal surgeries. Today, she reports lower abdominal pain that comes off and on and radiates throughout entire lower abdomen. Alleviated by NSAIDs or Tylenol.  She attributes this to probable adhesions from previous surgeries or recurrent ovarian cyst.  Patient also wants to be checked for GC/Chlam and other STIs.  The following portions of the patient's history were reviewed and updated as appropriate: allergies, current medications, past family history, past medical history, past social history, past surgical history and problem list.   Review of Systems:  Pertinent items are noted in HPI.  Objective:  Physical Exam BP 111/70 mmHg  Pulse 87  Ht 5\' 8"  (1.727 m)  Wt 172 lb (78.019 kg)  BMI 26.16 kg/m2  LMP 03/23/2013 Gen: NAD Abd: Soft, mild diffuse lower abdominal tenderness and nondistended, multiple healed surgical scars Pelvic: Deferred  Labs and Imaging Office limited ultrasound: Negative for ovarian cysts; normal ovaries visualized UA: Negative for infection  Assessment & Plan:  Ultrasound negative for cysts, patient reassured Pain could be due to adhesions or other etiology; UA negative. Will follow up STI screen. Patient to return if pain worsens or for other concerning symptoms Routine preventative health maintenance measures emphasized.   Verita Schneiders, MD, [place] Attending [place] for [employer], [place]

## 2014-03-03 LAB — RPR: RPR Ser Ql: NONREACTIVE

## 2014-03-04 LAB — HEPATITIS C ANTIBODY: Hep C Virus Ab: <0.1 s/co ratio (ref 0.0–0.9)

## 2014-03-04 LAB — HIV ANTIBODY (ROUTINE TESTING W REFLEX): HIV Screen 4th Generation wRfx: NONREACTIVE

## 2014-03-04 LAB — HEPATITIS B SURFACE ANTIGEN: Hepatitis B Surface Ag: NEGATIVE

## 2014-03-05 LAB — CHLAMYDIA/GONOCOCCUS/TRICHOMONAS, NAA
CHLAMYDIA BY NAA: NEGATIVE
GONOCOCCUS BY NAA: NEGATIVE
TRICH VAG BY NAA: NEGATIVE

## 2014-04-04 IMAGING — US US PELVIS COMPLETE
1 series · 13 of 25 positions shown · non-contrast
Comparison: None.

CLINICAL DATA: Lower abdominal and pelvic pain.  Personal history
of poly cystic ovary syndrome and prior cesarean sections.

TRANSABDOMINAL AND TRANSVAGINAL ULTRASOUND OF PELVIS
TECHNIQUE: Both transabdominal and transvaginal ultrasound
examinations of the pelvis were performed.  Transabdominal
technique was performed for global imaging of the pelvis including
uterus, ovaries, adnexal regions, and pelvic cul-de-sac.
It was necessary to proceed with endovaginal exam following the
transabdominal exam to visualize the IUD and ovaries. 3-D volume
imaging was also performed by transvaginal approach.

[Series 1: us transvaginal non-ob · 13 of 49 slices shown]
[im 1/49]
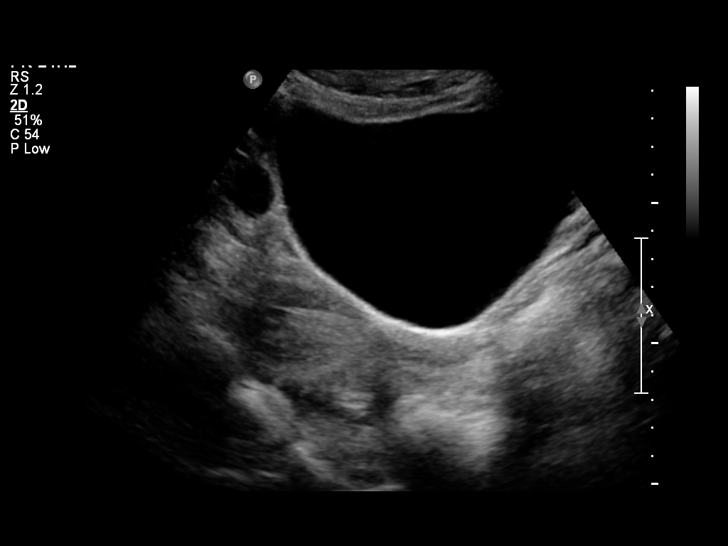
[im 5/49]
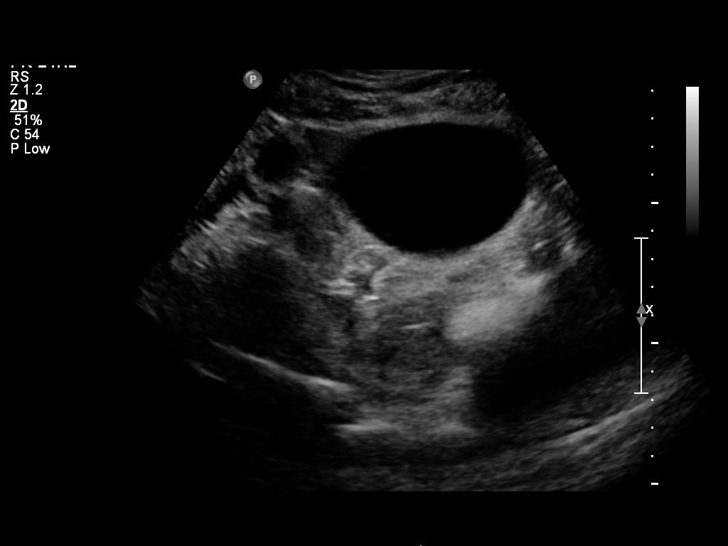
[im 9/49]
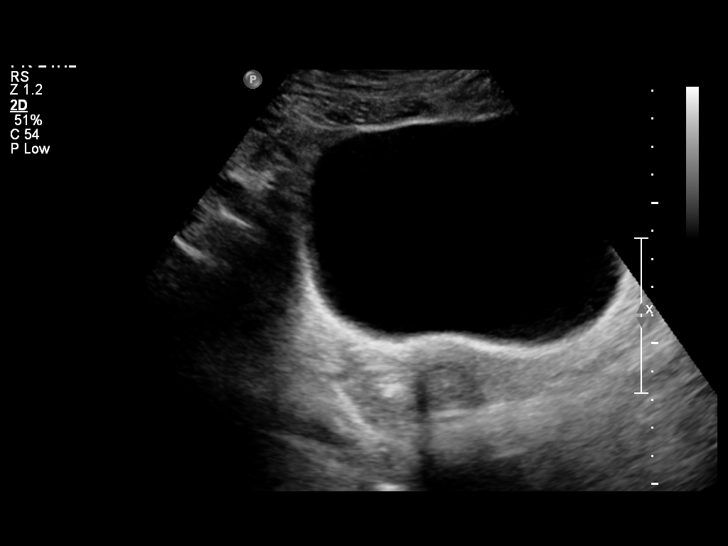
[im 13/49]
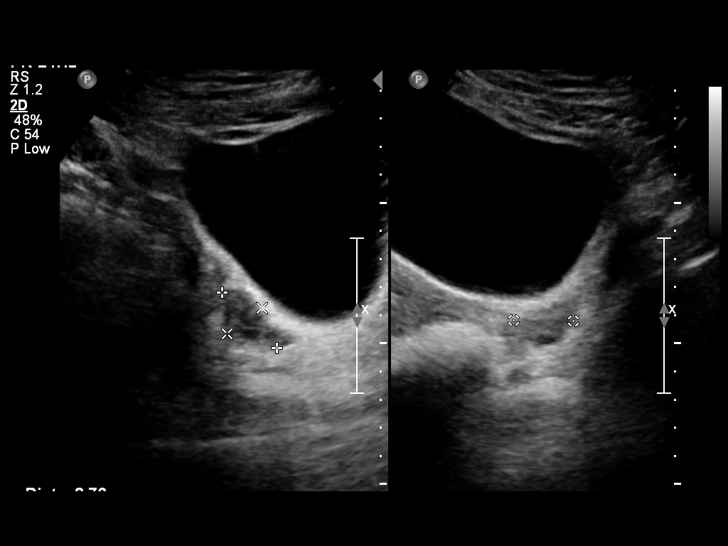
[im 17/49]
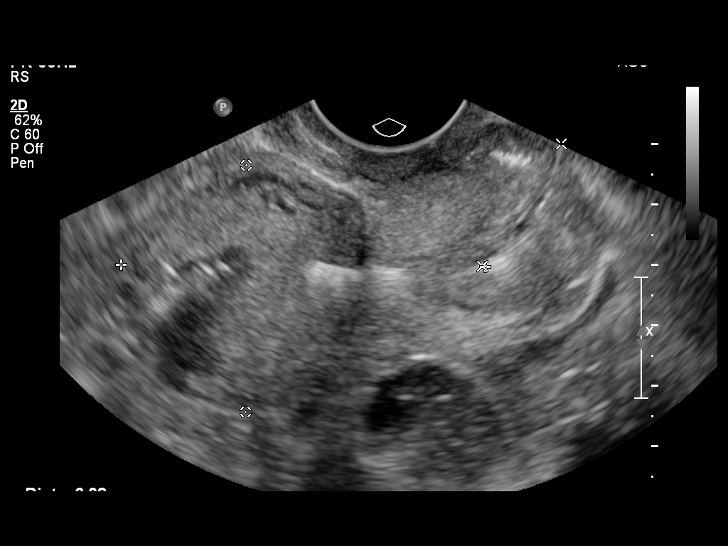
[im 21/49]
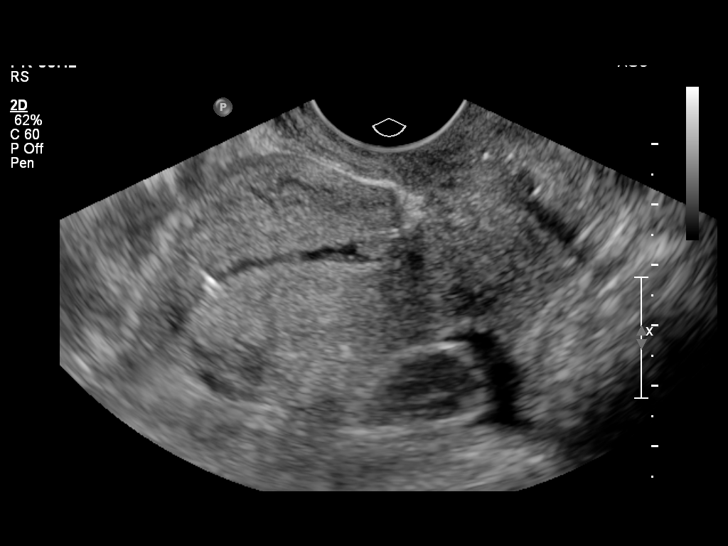
[im 25/49]
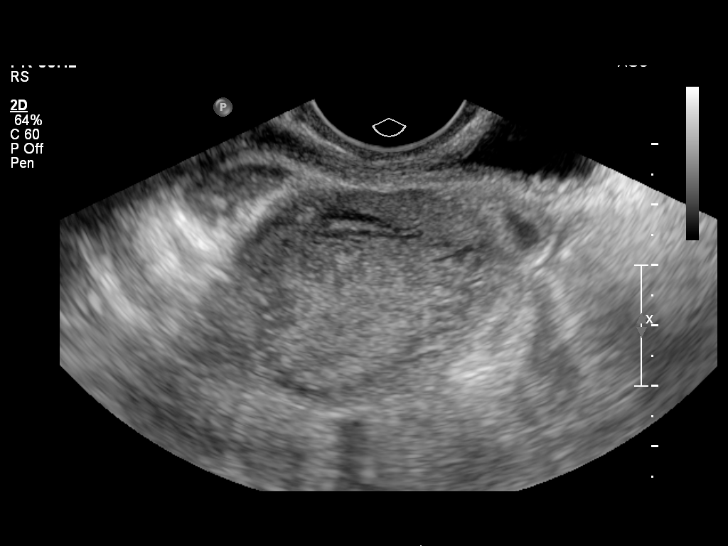
[im 29/49]
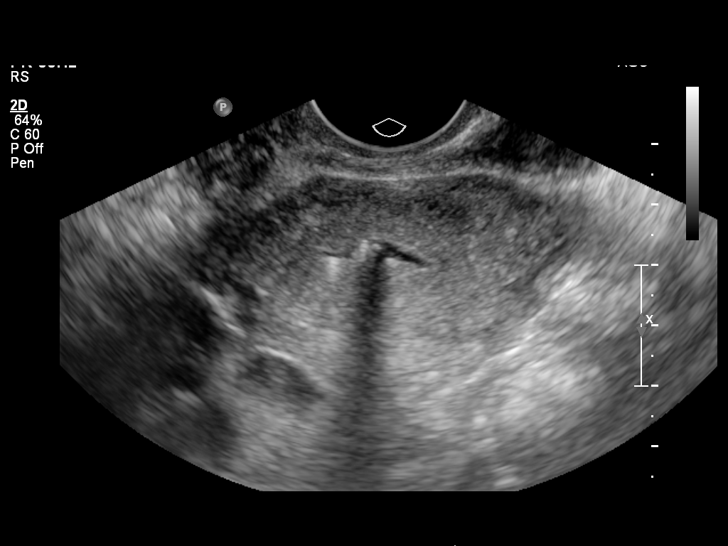
[im 33/49]
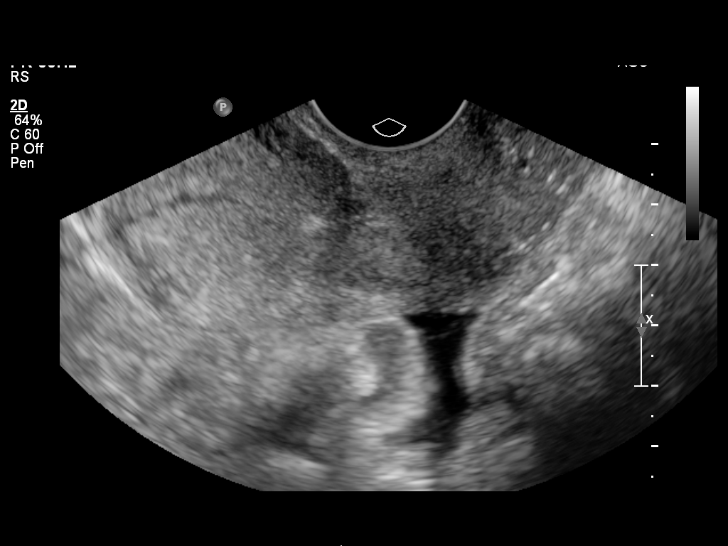
[im 37/49]
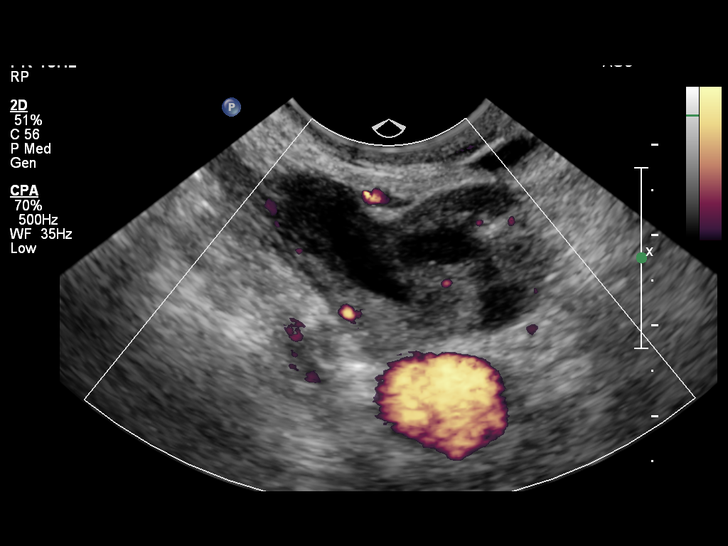
[im 41/49]
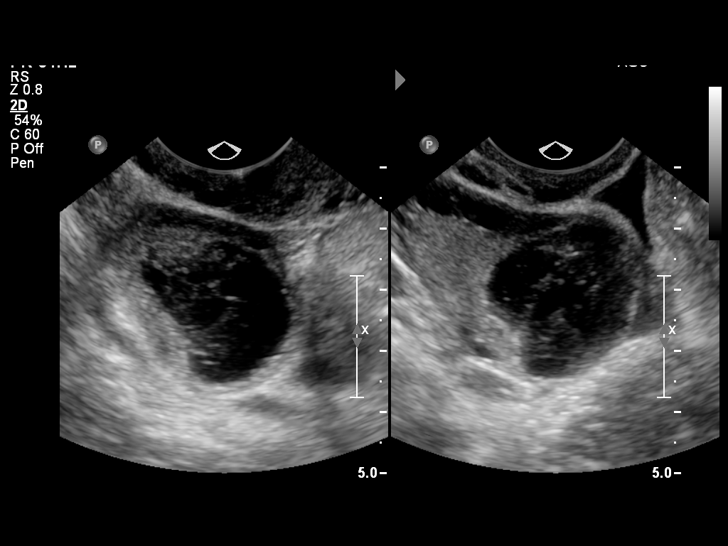
[im 45/49]
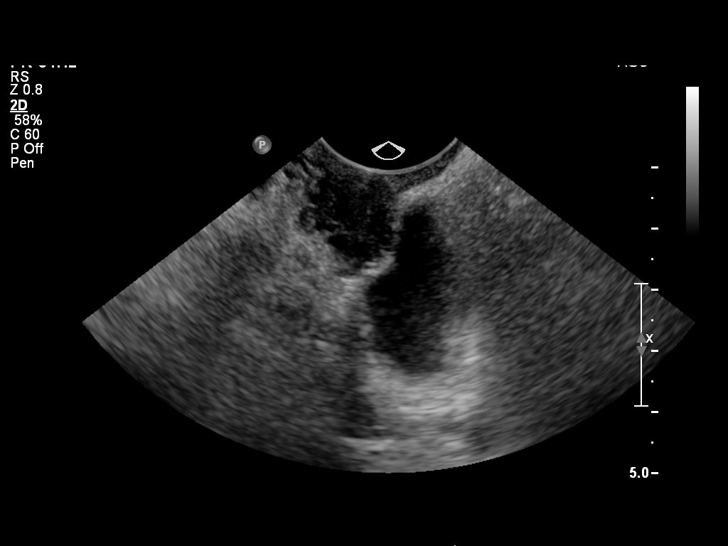
[im 49/49]
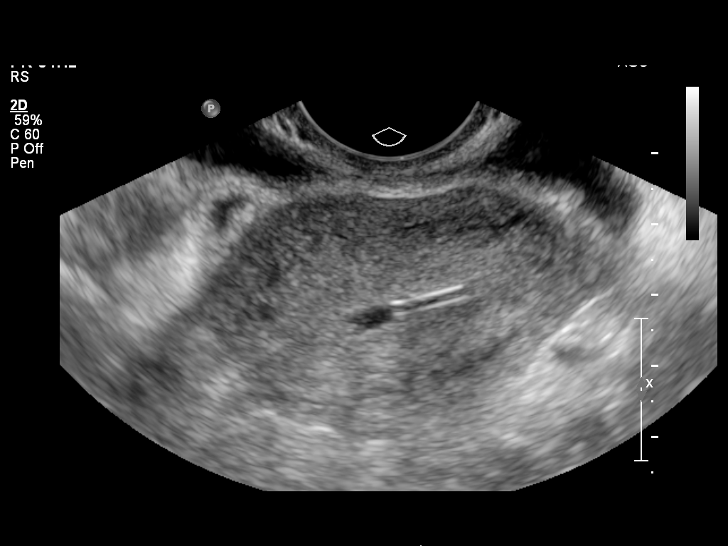

[13 of 25 positions shown; findings below may reference images not displayed]

FINDINGS: Uterus:  8.4 by 4.1 x 5.7 cm.  No fibroids or other uterine mass
identified.

Endometrium: IUD is seen within the endometrial cavity. The IUD
sidearms are seen in normal location in the fundal portion of the
endometrial cavity, however the inferior edge of the IUD is seen in
the left lower uterine segment and may penetrate into the
myometrium.

Right ovary: 4.7 x 2.8 x 3.1 cm.  A benign appearing hemorrhagic
cyst is noted in the right ovary which measures 3.2 cm in maximum
diameter.

Left ovary: 3.1 x 1.5 x 1.5 cm.  Normal appearance.

Other Findings:  No free fluid
IMPRESSION: 1.  3.2 cm benign appearing hemorrhagic cyst in the right ovary.
2.  Normal appearance of left ovary.
3.  IUD seen in endometrial cavity, with possible myometrial
penetration of its inferior edge in the left lower uterine segment.

## 2014-04-26 DIAGNOSIS — C439 Malignant melanoma of skin, unspecified: Secondary | ICD-10-CM | POA: Insufficient documentation

## 2014-04-26 DIAGNOSIS — F418 Other specified anxiety disorders: Secondary | ICD-10-CM | POA: Insufficient documentation

## 2014-04-26 DIAGNOSIS — F329 Major depressive disorder, single episode, unspecified: Secondary | ICD-10-CM | POA: Insufficient documentation

## 2014-04-26 DIAGNOSIS — Z87442 Personal history of urinary calculi: Secondary | ICD-10-CM | POA: Insufficient documentation

## 2014-04-26 DIAGNOSIS — J302 Other seasonal allergic rhinitis: Secondary | ICD-10-CM | POA: Insufficient documentation

## 2014-04-26 DIAGNOSIS — D649 Anemia, unspecified: Secondary | ICD-10-CM | POA: Insufficient documentation

## 2014-04-26 DIAGNOSIS — F419 Anxiety disorder, unspecified: Secondary | ICD-10-CM | POA: Insufficient documentation

## 2014-04-26 DIAGNOSIS — Z85820 Personal history of malignant melanoma of skin: Secondary | ICD-10-CM | POA: Insufficient documentation

## 2014-04-26 DIAGNOSIS — Z6827 Body mass index (BMI) 27.0-27.9, adult: Secondary | ICD-10-CM | POA: Insufficient documentation

## 2014-04-26 DIAGNOSIS — F32A Depression, unspecified: Secondary | ICD-10-CM | POA: Insufficient documentation

## 2014-06-06 ENCOUNTER — Telehealth: Payer: Self-pay | Admitting: *Deleted

## 2014-06-06 MED ORDER — VALACYCLOVIR HCL 1 G PO TABS: 1000.0000 mg | ORAL_TABLET | Freq: Three times a day (TID) | ORAL | Status: DC

## 2014-06-06 NOTE — Telephone Encounter (Signed)
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  She is needing to take the Valtrex more often and needs refills.  I have suggested that once this episode is clear she take 500mg  daily for preventative.  She agrees.

## 2014-06-14 ENCOUNTER — Ambulatory Visit (INDEPENDENT_AMBULATORY_CARE_PROVIDER_SITE_OTHER): Payer: Managed Care, Other (non HMO) | Admitting: Family Medicine

## 2014-06-14 ENCOUNTER — Encounter: Payer: Self-pay | Admitting: Family Medicine

## 2014-06-14 VITALS — BP 112/60 | HR 80 | Temp 98.2°F | Resp 16 | Ht 67.25 in | Wt 190.0 lb

## 2014-06-14 DIAGNOSIS — F329 Major depressive disorder, single episode, unspecified: Secondary | ICD-10-CM | POA: Diagnosis not present

## 2014-06-14 DIAGNOSIS — F419 Anxiety disorder, unspecified: Secondary | ICD-10-CM | POA: Diagnosis not present

## 2014-06-14 DIAGNOSIS — R635 Abnormal weight gain: Secondary | ICD-10-CM | POA: Diagnosis not present

## 2014-06-14 DIAGNOSIS — F32A Depression, unspecified: Secondary | ICD-10-CM

## 2014-06-14 MED ORDER — PHENTERMINE HCL 30 MG PO CAPS: 30.0000 mg | ORAL_CAPSULE | ORAL | Status: DC

## 2014-06-14 NOTE — Progress Notes (Signed)
Subjective:    Patient ID: Debra Hernandez, female    DOB: 1985/07/02, 29 y.o.   MRN: [id number]  HPI Comments: Pt is currently taking Lexapro and Wellbutrin for anxiety/depression.  Pt's Main concern today is weight gain. Pt has been on Phentermine in the past, but since coming off of the medication, pt has gained more than 20 pounds. Pt reports she exercises 7 days a week x 1 hour of cardio, stretches before bed, and walks stairs during lunch at work. Pt reports she eats healthy (fruits, no added sugar, no sodas, no carbs, eats healthy snacks throughout the day, etc). Pt does not count calories. Pt states her weight gain is "consuming" her, and is frustrated as to why she cannot lose weight.   Anxiety Presents for follow-up visit. Symptoms include confusion, decreased concentration, irritability and obsessions (about weight). Patient reports no chest pain, compulsions, depressed mood, dizziness, dry mouth, excessive worry, feeling of choking, hyperventilation, insomnia, malaise, muscle tension, nausea, nervous/anxious behavior, palpitations, panic, restlessness, shortness of breath or suicidal ideas. The severity of symptoms is mild. The patient sleeps 6 hours per night. The quality of sleep is good. Nighttime awakenings: none.   There is no history of hyperthyroidism. Compliance with prior treatments has been good. Compliance with medications is 76-100%.      Review of Systems  Constitutional: Positive for irritability, fatigue (possibly work related) and unexpected weight change. Negative for fever, chills, diaphoresis, activity change and appetite change.  Respiratory: Negative for cough and shortness of breath.   Cardiovascular: Negative for chest pain, palpitations and leg swelling.  Gastrointestinal: Negative for nausea.  Neurological: Negative for dizziness.  Psychiatric/Behavioral: Positive for confusion and decreased concentration. Negative for suicidal ideas. The patient is not  nervous/anxious and does not have insomnia.    BP 112/60 mmHg  Pulse 80  Temp(Src) 98.2 F (36.8 C) (Oral)  Resp 16  Ht 5' 7.25" (1.708 m)  Wt 190 lb (86.183 kg)  BMI 29.54 kg/m2  LMP 03/23/2013   Past Medical History  Diagnosis Date  . Anemia   . Polycystic ovary syndrome   . History of seasonal allergies   . Blood transfusion 5/11    2 units bld transfused at [facility]  . Kidney stone     passsed stone - no surgery required  . Melanoma 2003    on back - used local to removed  . PONV (postoperative nausea and vomiting)    Past Surgical History  Procedure Laterality Date  . Laporascopy    . Tonsillectomy    . Tonsillectomy and adenoidectomy    . Diagnostic laparoscopy      ovarian cyst removed  . Wisdom tooth extraction      2 lower extracted  . Svd  04/2006    x 1 - at [facility]  . Scar revision  02/02/2011    Procedure: SCAR REVISION;  Surgeon: Donnamae Jude, MD;  Location: [place] [facility];  Service: Gynecology;  Laterality: N/A;  . Laparoscopic bilateral salpingectomy Bilateral 07/20/2012    Procedure: LAPAROSCOPIC BILATERAL SALPINGECTOMY;  Surgeon: Osborne Oman, MD;  Location: [place] [facility];  Service: Gynecology;  Laterality: Bilateral;  . Novasure ablation N/A 07/20/2012    Procedure: NOVASURE ABLATION;  Surgeon: Osborne Oman, MD;  Location: [place] [facility];  Service: Gynecology;  Laterality: N/A;  . Laparoscopic hysterectomy Left 04/18/2013    Procedure: HYSTERECTOMY TOTAL LAPAROSCOPIC and  left salpingectomy;  Surgeon: Donnamae Jude, MD;  Location: [place] [facility];  Service: Gynecology;  Laterality: Left;  . Abdominal hysterectomy  2015  . Cesarean section  05/2009    at 39 wks at [facility],  lived only 12 days  . Cesarean section  02/02/2011    Procedure: CESAREAN SECTION;  Surgeon: Donnamae Jude, MD;  Location: [place] [facility];  Service: Gynecology;  Laterality: N/A;  Repeart c/section    reports that she has never smoked. She has never used smokeless tobacco. She  reports that she drinks alcohol. She reports that she does not use illicit drugs. family history includes Cancer in her father, maternal grandmother, and mother; Cancer (age of onset: 33) in her paternal aunt; Depression in her mother; Diabetes in her maternal grandfather; Mental illness in her mother; Parkinson's disease in her maternal grandmother and mother. There is no history of Anesthesia problems, Hypotension, Malignant hyperthermia, or Pseudochol deficiency. Allergies  Allergen Reactions  . Strattera [Atomoxetine Hcl] Nausea And Vomiting and Other (See Comments)    insomnia   Current Outpatient Prescriptions on File Prior to Visit  Medication Sig Dispense Refill  . buPROPion (WELLBUTRIN SR) 150 MG 12 hr tablet Take 1 tablet by mouth 2 (two) times daily.    . cetirizine (ZYRTEC) 10 MG tablet Take 10 mg by mouth daily.    [name] escitalopram (LEXAPRO) 10 MG tablet Take by mouth daily.     . ferrous sulfate 325 (65 FE) MG tablet Take by mouth.    . MULTIPLE VITAMIN PO Take 1 tablet by mouth daily.    . polyvinyl alcohol (LIQUIFILM TEARS) 1.4 % ophthalmic solution Place 1 drop into both eyes as needed for dry eyes.    . valACYclovir (VALTREX) 1000 MG tablet Take 1 tablet (1,000 mg total) by mouth 3 (three) times daily. 90 tablet 6   No current facility-administered medications on file prior to visit.       Objective:   Physical Exam  Constitutional: She appears well-developed and well-nourished.  Neurological: She is alert.  Psychiatric: She has a normal mood and affect. Her behavior is normal. Judgment and thought content normal.    BP 112/60 mmHg  Pulse 80  Temp(Src) 98.2 F (36.8 C) (Oral)  Resp 16  Ht 5' 7.25" (1.708 m)  Wt 190 lb (86.183 kg)  BMI 29.54 kg/m2  LMP 03/23/2013      Assessment & Plan:  1. Anxiety  Will try to taper off Lexapro.  Continue Wellbutrin.   2. Clinical depression  Doing well. Will try to taper off the Lexapro. And see if helps her weight loss.    3. Weight gain  Will try Phentermine short term again.  Understands risks and benefits but would like to proceed. Has tried it previously. Will consider Metformin down the road.

## 2014-07-12 ENCOUNTER — Encounter: Payer: Self-pay | Admitting: Family Medicine

## 2014-07-12 ENCOUNTER — Ambulatory Visit (INDEPENDENT_AMBULATORY_CARE_PROVIDER_SITE_OTHER): Payer: Managed Care, Other (non HMO) | Admitting: Family Medicine

## 2014-07-12 VITALS — BP 110/60 | HR 100 | Temp 97.7°F | Resp 16 | Ht 67.25 in | Wt 179.0 lb

## 2014-07-12 DIAGNOSIS — F32A Depression, unspecified: Secondary | ICD-10-CM

## 2014-07-12 DIAGNOSIS — F329 Major depressive disorder, single episode, unspecified: Secondary | ICD-10-CM

## 2014-07-12 DIAGNOSIS — R635 Abnormal weight gain: Secondary | ICD-10-CM | POA: Diagnosis not present

## 2014-07-12 MED ORDER — PHENTERMINE HCL 30 MG PO CAPS: 30.0000 mg | ORAL_CAPSULE | ORAL | Status: DC
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
## 2014-11-08 ENCOUNTER — Ambulatory Visit (INDEPENDENT_AMBULATORY_CARE_PROVIDER_SITE_OTHER): Payer: Managed Care, Other (non HMO) | Admitting: Family Medicine

## 2014-11-08 ENCOUNTER — Encounter: Payer: Self-pay | Admitting: Family Medicine

## 2014-11-08 VITALS — BP 116/70 | HR 64 | Temp 97.6°F | Resp 16 | Ht 67.0 in | Wt 178.6 lb

## 2014-11-08 DIAGNOSIS — F329 Major depressive disorder, single episode, unspecified: Secondary | ICD-10-CM | POA: Diagnosis not present

## 2014-11-08 DIAGNOSIS — K5900 Constipation, unspecified: Secondary | ICD-10-CM

## 2014-11-08 DIAGNOSIS — F32A Depression, unspecified: Secondary | ICD-10-CM

## 2014-11-08 NOTE — Progress Notes (Signed)
Patient ID: Debra Hernandez, female   DOB: 03/27/1985, 29 y.o.   MRN: [id number]        Patient: Debra Hernandez Female    DOB: [birth year]   29 y.o.   MRN: [id number] Visit Date: 11/08/2014  Today's Provider: Margarita Rana, MD   Chief Complaint  Patient presents with  . Obesity  . Depression  . Abdominal Pain   Subjective:    HPI  Follow up for weight  The patient was last seen for this 4 months ago. Changes made at last office visit (07/12/14) include phentermine 30 mg.  She reports good compliance with treatment. She feels that condition is Unchanged. She is not having side effects. Patient has been off medication for about 3 weeks.  ------------------------------------------------------------------------------------  Depression, Follow-up  She  was last seen for this 4 months ago. Changes made at last visit include tapering off Lexapro continue Bupropion HCl . Current symptoms include: none She feels she is stable since last visit. Patient reports she only takes Bupropion during weekdays.  ------------------------------------------------------------------------  Abdominal Pain: Patient complains of abdominal pain. The pain is described as blotting, and is 10/10 in intensity. Pain is located in the suprapubic without radiation. Onset was 3 weeks ago. Symptoms have been unchanged since. Aggravating factors: food.  Alleviating factors: increased fiber, Pepto bismol. Associated symptoms: none. The patient denies fever, diarrhea, and constipation. Patient reports that she is not able to empty completely. Patient is requesting referral to GI.  Did try Miralax, enema, other OTCs and did not help.    Does also feel like she is having a fruit allergy.          Allergies  Allergen Reactions  . Strattera [Atomoxetine Hcl] Nausea And Vomiting and Other (See Comments)    insomnia   Previous Medications   BUPROPION (WELLBUTRIN SR) 150 MG 12 HR TABLET    Take 1 tablet by  mouth 2 (two) times daily.   CETIRIZINE (ZYRTEC) 10 MG TABLET    Take 10 mg by mouth daily.   ESCITALOPRAM (LEXAPRO) 10 MG TABLET    Take by mouth daily.    FERROUS SULFATE 325 (65 FE) MG TABLET    Take by mouth.   MULTIPLE VITAMIN PO    Take 1 tablet by mouth daily.   PHENTERMINE 30 MG CAPSULE    Take 1 capsule (30 mg total) by mouth every morning.   POLYVINYL ALCOHOL (LIQUIFILM TEARS) 1.4 % OPHTHALMIC SOLUTION    Place 1 drop into both eyes as needed for dry eyes.   VALACYCLOVIR (VALTREX) 1000 MG TABLET    Take 1 tablet (1,000 mg total) by mouth 3 (three) times daily.    Review of Systems  Constitutional: Negative.   Gastrointestinal: Positive for abdominal pain and abdominal distention.  Genitourinary: Negative.     Social History  Substance Use Topics  . Smoking status: Never Smoker   . Smokeless tobacco: Never Used  . Alcohol Use: Yes     Comment: socially but none with pregnancy   Objective:   BP 116/70 mmHg  Pulse 64  Temp(Src) 97.6 F (36.4 C) (Oral)  Resp 16  Ht 5\' 7"  (1.702 m)  Wt 178 lb 9.6 oz (81.012 kg)  BMI 27.97 kg/m2  SpO2 99%  LMP 03/23/2013  Physical Exam  Constitutional: She is oriented to person, place, and time. She appears well-developed and well-nourished.  Cardiovascular: Normal rate and regular rhythm.   Pulmonary/Chest: Effort normal and breath sounds normal.  Abdominal:  Soft. Bowel sounds are normal.  Neurological: She is alert and oriented to person, place, and time.      Assessment & Plan:     1. Constipation, unspecified constipation type Will refer to GI to evaluate and treat.  - Ambulatory referral to Gastroenterology   2. Clinical depression Stable. Continue Wellbutrin.       Margarita Rana, MD  [facility]

## 2014-12-11 ENCOUNTER — Telehealth: Payer: Self-pay | Admitting: *Deleted

## 2014-12-11 DIAGNOSIS — A6000 Herpesviral infection of urogenital system, unspecified: Secondary | ICD-10-CM

## 2014-12-11 MED ORDER — VALACYCLOVIR HCL 1 G PO TABS: 1000.0000 mg | ORAL_TABLET | Freq: Three times a day (TID) | ORAL | Status: DC

## 2014-12-11 NOTE — Telephone Encounter (Signed)
-----   Message from Francia Greaves sent at 12/11/2014  8:06 AM EST ----- Regarding: Refill Request Contact: [phone] Needs refill on Valtrex. Uses [facility] on [address]

## 2014-12-11 NOTE — Telephone Encounter (Signed)
I have sent in refills of patients Valtrex for preventive.

## 2014-12-17 ENCOUNTER — Ambulatory Visit (INDEPENDENT_AMBULATORY_CARE_PROVIDER_SITE_OTHER): Payer: Managed Care, Other (non HMO) | Admitting: Gastroenterology

## 2014-12-17 ENCOUNTER — Encounter: Payer: Self-pay | Admitting: Gastroenterology

## 2014-12-17 VITALS — BP 105/73 | HR 76 | Temp 97.3°F | Ht 68.0 in | Wt 185.0 lb

## 2014-12-17 DIAGNOSIS — R194 Change in bowel habit: Secondary | ICD-10-CM | POA: Diagnosis not present

## 2014-12-17 DIAGNOSIS — K625 Hemorrhage of anus and rectum: Secondary | ICD-10-CM

## 2014-12-17 NOTE — Progress Notes (Addendum)
Gastroenterology Consultation  Referring Provider:     Margarita Rana, MD Primary Care Physician:  Margarita Rana, MD Primary Gastroenterologist:  Dr. Allen Norris     Reason for Consultation:     Abdominal bloating with change in bowel habits        HPI:   Debra Hernandez is a 29 y.o. y/o female referred for consultation & management of dominant bloating with change in bowel habits by Dr. Margarita Rana, MD.  This patient reports that she had no problems with her intestines prior to the pregnancy of her second child. The patient states it was a complicated pregnancy with the loss of the child 12 days after birth. During that time the patient had been on multiple antibiotics and had abdominal drains after having a C-section. She states after that her bowels have changed and had remained abnormal until she had a hysterectomy which she reports also receiving antibiotics for. Since then the patient has had a hard time moving her bowels with very small formed stools without a feeling of complete evacuation. The patient states that she spends much time straining at her stools and enzyme having a small amount of stool and blood when she strains to much. There is no report of any unexplained weight loss and in fact the patient reports she is gaining weight. She also reports that she has been having a lot of bloating associated with her change in bowel habits. There is no report of any family history of colon cancer colon polyps. He also continues to have abdominal pain in the lower abdomen.  Past Medical History  Diagnosis Date  . Anemia   . Polycystic ovary syndrome   . History of seasonal allergies   . Blood transfusion 5/11    2 units bld transfused at [facility]  . Kidney stone     passsed stone - no surgery required  . Melanoma ([place]) 2003    on back - used local to removed  . PONV (postoperative nausea and vomiting)   . Depression   . Anxiety     Past Surgical History  Procedure  Laterality Date  . Laporascopy    . Tonsillectomy    . Tonsillectomy and adenoidectomy    . Diagnostic laparoscopy      ovarian cyst removed  . Wisdom tooth extraction      2 lower extracted  . Svd  04/2006    x 1 - at [facility]  . Scar revision  02/02/2011    Procedure: SCAR REVISION;  Surgeon: Donnamae Jude, MD;  Location: [place] [facility];  Service: Gynecology;  Laterality: N/A;  . Laparoscopic bilateral salpingectomy Bilateral 07/20/2012    Procedure: LAPAROSCOPIC BILATERAL SALPINGECTOMY;  Surgeon: Osborne Oman, MD;  Location: [place] [facility];  Service: Gynecology;  Laterality: Bilateral;  . Novasure ablation N/A 07/20/2012    Procedure: NOVASURE ABLATION;  Surgeon: Osborne Oman, MD;  Location: [place] [facility];  Service: Gynecology;  Laterality: N/A;  . Laparoscopic hysterectomy Left 04/18/2013    Procedure: HYSTERECTOMY TOTAL LAPAROSCOPIC and  left salpingectomy;  Surgeon: Donnamae Jude, MD;  Location: [place] [facility];  Service: Gynecology;  Laterality: Left;  . Abdominal hysterectomy  2015  . Cesarean section  05/2009    at 39 wks at [facility],  lived only 12 days  . Cesarean section  02/02/2011    Procedure: CESAREAN SECTION;  Surgeon: Donnamae Jude, MD;  Location: [place] [facility];  Service: Gynecology;  Laterality: N/A;  Repeart c/section    Prior to Admission medications   Medication Sig Start Date End Date Taking? Authorizing Provider  buPROPion (WELLBUTRIN SR) 150 MG 12 hr tablet Take 1 tablet by mouth 2 (two) times daily. 10/15/13  Yes Historical Provider, MD  cetirizine (ZYRTEC) 10 MG tablet Take 10 mg by mouth daily.   Yes Historical Provider, MD  ferrous sulfate 325 (65 FE) MG tablet Take 325 mg by mouth.    Yes Historical Provider, MD  MULTIPLE VITAMIN PO Take 1 tablet by mouth daily.   Yes Historical Provider, MD  polyvinyl alcohol (LIQUIFILM TEARS) 1.4 % ophthalmic solution Place 1 drop into both eyes as needed for dry eyes.   Yes Historical Provider, MD  valACYclovir (VALTREX) 1000 MG  tablet Take 1 tablet (1,000 mg total) by mouth 3 (three) times daily. Patient taking differently: Take 1,000 mg by mouth as needed.  12/11/14  Yes Osborne Oman, MD    Family History  Problem Relation Age of Onset  . Cancer Mother     Ovarian  . Parkinson's disease Mother   . Mental illness Mother   . Depression Mother   . Cancer Maternal Grandmother     Ovarian  . Parkinson's disease Maternal Grandmother   . Diabetes Maternal Grandfather   . Anesthesia problems Neg Hx   . Hypotension Neg Hx   . Malignant hyperthermia Neg Hx   . Pseudochol deficiency Neg Hx   . Cancer Father     prostate  . Cancer Paternal Aunt 39    Breast     Social History  Substance Use Topics  . Smoking status: Never Smoker   . Smokeless tobacco: Never Used  . Alcohol Use: Yes     Comment: socially but none with pregnancy    Allergies as of 12/17/2014 - Review Complete 12/17/2014  Allergen Reaction Noted  . Atomoxetine Other (See Comments) 12/17/2014  . Strattera [atomoxetine hcl] Nausea And Vomiting and Other (See Comments) 07/31/2010    Review of Systems:    All systems reviewed and negative except where noted in HPI.   Physical Exam:  BP 105/73 mmHg  Pulse 76  Temp(Src) 97.3 F (36.3 C) (Oral)  Ht 5\' 8"  (1.727 m)  Wt 185 lb (83.915 kg)  BMI 28.14 kg/m2  LMP 03/23/2013 Patient's last menstrual period was 03/23/2013. Psych:  Alert and cooperative. Normal mood and affect. General:   Alert,  Well-developed, well-nourished, pleasant and cooperative in NAD Head:  Normocephalic and atraumatic. Eyes:  Sclera clear, no icterus.   Conjunctiva pink. Ears:  Normal auditory acuity. Nose:  No deformity, discharge, or lesions. Mouth:  No deformity or lesions,oropharynx pink & moist. Neck:  Supple; no masses or thyromegaly. Lungs:  Respirations even and unlabored.  Clear throughout to auscultation.   No wheezes, crackles, or rhonchi. No acute distress. Heart:  Regular rate and rhythm; no  murmurs, clicks, rubs, or gallops. Abdomen:  Normal bowel sounds.  No bruits.  SofDiffusely tenderd non-distended without masses, hepatosplenomegaly or hernias noted.  No guarding or rebound tenderness.  Negative Carnett sign.   Rectal:  Deferred.  Msk:  Symmetrical without gross deformities.  Good, equal movement & strength bilaterally. Pulses:  Normal pulses noted. Extremities:  No clubbing or edema.  No cyanosis. Neurologic:  Alert and oriented x3;  grossly normal neurologically. Skin:  Intact without significant lesions or rashes.  No jaundice. Lymph Nodes:  No significant cervical adenopathy. Psych:  Alert and cooperative. Normal mood and affect.  Imaging  Studies: No results found.  Assessment and Plan:   Debra Hernandez is a 29 y.o. y/o female who comes in today with a history of change in bowel habits.  She reports the first change in bowel habits occurred after she received antibiotics for her C-section and then again she had a another change in bowel habits after her hysterectomy. 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 Her lower abdominal pain is consistent with muscular pain at the site of incision from her previous surgeries. The patient also reports that she has been found multiple times to have adhesions due to her surgeries. The patient has been told to start on VSL #3 and if that does not work then to substituted with or add Citrucel to help bulk up her stools. He will also be set up for colonoscopy due to her rectal bleeding and feeling of incomplete evacuation. The patient has been explained the plan and agrees with it.  I have discussed risks & benefits which include, but are not limited to, bleeding, infection, perforation & drug reaction.  The patient agrees with this plan & written consent will be obtained.      Note: This dictation was prepared with Dragon dictation along with smaller phrase technology. Any transcriptional errors that  result from this process are unintentional.

## 2014-12-18 ENCOUNTER — Encounter: Payer: Self-pay | Admitting: *Deleted

## 2014-12-19 NOTE — Discharge Instructions (Signed)

## 2014-12-20 ENCOUNTER — Encounter
Admission: RE | Disposition: A | Discharge status: Home / Self Care | Payer: Self-pay | Source: Ambulatory Visit | Attending: Gastroenterology

## 2014-12-20 ENCOUNTER — Ambulatory Visit: Payer: Managed Care, Other (non HMO) | Admitting: Anesthesiology

## 2014-12-20 ENCOUNTER — Ambulatory Visit
Admission: RE | Admit: 2014-12-20 | Discharge: 2014-12-20 | Disposition: A | Discharge status: Home / Self Care | Payer: Managed Care, Other (non HMO) | Source: Ambulatory Visit | Attending: Gastroenterology | Admitting: Gastroenterology

## 2014-12-20 DIAGNOSIS — F419 Anxiety disorder, unspecified: Secondary | ICD-10-CM | POA: Diagnosis not present

## 2014-12-20 DIAGNOSIS — Z820 Family history of epilepsy and other diseases of the nervous system: Secondary | ICD-10-CM | POA: Diagnosis not present

## 2014-12-20 DIAGNOSIS — K641 Second degree hemorrhoids: Secondary | ICD-10-CM | POA: Diagnosis not present

## 2014-12-20 DIAGNOSIS — Z85820 Personal history of malignant melanoma of skin: Secondary | ICD-10-CM | POA: Diagnosis not present

## 2014-12-20 DIAGNOSIS — Z90710 Acquired absence of both cervix and uterus: Secondary | ICD-10-CM | POA: Diagnosis not present

## 2014-12-20 DIAGNOSIS — Z833 Family history of diabetes mellitus: Secondary | ICD-10-CM | POA: Diagnosis not present

## 2014-12-20 DIAGNOSIS — Z87442 Personal history of urinary calculi: Secondary | ICD-10-CM | POA: Diagnosis not present

## 2014-12-20 DIAGNOSIS — E282 Polycystic ovarian syndrome: Secondary | ICD-10-CM | POA: Diagnosis not present

## 2014-12-20 DIAGNOSIS — Z888 Allergy status to other drugs, medicaments and biological substances status: Secondary | ICD-10-CM | POA: Insufficient documentation

## 2014-12-20 DIAGNOSIS — K921 Melena: Secondary | ICD-10-CM | POA: Insufficient documentation

## 2014-12-20 DIAGNOSIS — R194 Change in bowel habit: Secondary | ICD-10-CM | POA: Insufficient documentation

## 2014-12-20 DIAGNOSIS — Z803 Family history of malignant neoplasm of breast: Secondary | ICD-10-CM | POA: Insufficient documentation

## 2014-12-20 DIAGNOSIS — Z9889 Other specified postprocedural states: Secondary | ICD-10-CM | POA: Insufficient documentation

## 2014-12-20 DIAGNOSIS — J302 Other seasonal allergic rhinitis: Secondary | ICD-10-CM | POA: Diagnosis not present

## 2014-12-20 DIAGNOSIS — Z79899 Other long term (current) drug therapy: Secondary | ICD-10-CM | POA: Diagnosis not present

## 2014-12-20 DIAGNOSIS — F329 Major depressive disorder, single episode, unspecified: Secondary | ICD-10-CM | POA: Insufficient documentation

## 2014-12-20 DIAGNOSIS — Z8041 Family history of malignant neoplasm of ovary: Secondary | ICD-10-CM | POA: Insufficient documentation

## 2014-12-20 DIAGNOSIS — Z818 Family history of other mental and behavioral disorders: Secondary | ICD-10-CM | POA: Diagnosis not present

## 2014-12-20 HISTORY — PX: COLONOSCOPY WITH PROPOFOL: SHX5780

## 2014-12-20 HISTORY — DX: Presence of spectacles and contact lenses: Z97.3

## 2014-12-20 SURGERY — COLONOSCOPY WITH PROPOFOL
Anesthesia: Monitor Anesthesia Care | Wound class: Contaminated

## 2014-12-20 MED ORDER — PROPOFOL 10 MG/ML IV BOLUS
INTRAVENOUS | Status: DC | PRN
Start: 2014-12-20 — End: 2014-12-20
  Administered 2014-12-20: 20 mg via INTRAVENOUS
  Administered 2014-12-20: 30 mg via INTRAVENOUS
  Administered 2014-12-20: 20 mg via INTRAVENOUS
  Administered 2014-12-20 (×3): 30 mg via INTRAVENOUS

## 2014-12-20 MED ORDER — LACTATED RINGERS IV SOLN
INTRAVENOUS | Status: DC
  Administered 2014-12-20: 07:00:00 via INTRAVENOUS

## 2014-12-20 MED ORDER — LIDOCAINE HCL (CARDIAC) 20 MG/ML IV SOLN
INTRAVENOUS | Status: DC | PRN
  Administered 2014-12-20: 30 mg via INTRAVENOUS

## 2014-12-20 MED ORDER — SIMETHICONE 40 MG/0.6ML PO SUSP
ORAL | Status: DC | PRN
  Administered 2014-12-20: 08:00:00

## 2014-12-20 SURGICAL SUPPLY — 28 items

## 2014-12-20 NOTE — Anesthesia Postprocedure Evaluation (Signed)
Anesthesia Post Note  Patient: Debra Hernandez  Procedure(s) Performed: Procedure(s) (LRB): COLONOSCOPY WITH PROPOFOL (N/A)  Patient location during evaluation: PACU Anesthesia Type: MAC Level of consciousness: awake and alert and oriented Pain management: satisfactory to patient Vital Signs Assessment: post-procedure vital signs reviewed and stable Respiratory status: spontaneous breathing, nonlabored ventilation and respiratory function stable Cardiovascular status: blood pressure returned to baseline and stable Postop Assessment: Adequate PO intake and No signs of nausea or vomiting Anesthetic complications: no    Raliegh Ip

## 2014-12-20 NOTE — Op Note (Signed)
[facility] Patient Name: Debra Hernandez Procedure Date: 12/20/2014 7:32 AM MRN: [id number] Account #: [id number] Date of Birth: 1985-05-09 Admit Type: Outpatient Age: 29 Room: [facility] OR ROOM 01 Gender: Female Note Status: Finalized Procedure:         Colonoscopy Indications:       Hematochezia, Incidental change in bowel habits noted Providers:         Lucilla Lame, MD Referring MD:      Jerrell Belfast, MD (Referring MD) Medicines:         Propofol per Anesthesia Complications:     No immediate complications. Procedure:         Pre-Anesthesia Assessment:                    - Prior to the procedure, a History and Physical was                     performed, and patient medications and allergies were                     reviewed. The patient's tolerance of previous anesthesia                     was also reviewed. The risks and benefits of the procedure                     and the sedation options and risks were discussed with the                     patient. All questions were answered, and informed consent                     was obtained. Prior Anticoagulants: The patient has taken                     no previous anticoagulant or antiplatelet agents. ASA                     Grade Assessment: II - A patient with mild systemic                     disease. After reviewing the risks and benefits, the                     patient was deemed in satisfactory condition to undergo                     the procedure.                    After obtaining informed consent, the colonoscope was                     passed under direct vision. Throughout the procedure, the                     patient's blood pressure, pulse, and oxygen saturations                     were monitored continuously. The Olympus CF H180AL                     colonoscope (S#: [id number]) was introduced through the anus  and advanced to the the cecum, identified by  appendiceal                     orifice and ileocecal valve. The colonoscopy was performed                     without difficulty. The patient tolerated the procedure                     well. The quality of the bowel preparation was excellent. Findings:      The perianal and digital rectal examinations were normal.      Non-bleeding internal hemorrhoids were found during retroflexion. The       hemorrhoids were Grade II (internal hemorrhoids that prolapse but reduce       spontaneously). Impression:        - Non-bleeding internal hemorrhoids.                    - No specimens collected. Recommendation:    - High fiber diet. Procedure Code(s): --- Professional ---                    [phone], Colonoscopy, flexible; diagnostic, including                     collection of specimen(s) by brushing or washing, when                     performed (separate procedure) Diagnosis Code(s): --- Professional ---                    K92.1, Melena CPT copyright 2014 American Medical Association. All rights reserved. The codes documented in this report are preliminary and upon coder review may  be revised to meet current compliance requirements. Lucilla Lame, MD 12/20/2014 7:54:55 AM This report has been signed electronically. Number of Addenda: 0 Note Initiated On: 12/20/2014 7:32 AM Scope Withdrawal Time: 0 hours 7 minutes 58 seconds  Total Procedure Duration: 0 hours 10 minutes 44 seconds       [facility]

## 2014-12-20 NOTE — Anesthesia Preprocedure Evaluation (Signed)
Anesthesia Evaluation  Patient identified by MRN, date of birth, ID band  Reviewed: Allergy & Precautions, H&P , NPO status , Patient's Chart, lab work & pertinent test results  History of Anesthesia Complications (+) PONV and history of anesthetic complications  Airway Mallampati: II  TM Distance: >3 FB Neck ROM: full    Dental no notable dental hx.    Pulmonary    Pulmonary exam normal        Cardiovascular  Rhythm:regular Rate:Normal     Neuro/Psych PSYCHIATRIC DISORDERS    GI/Hepatic   Endo/Other    Renal/GU      Musculoskeletal   Abdominal   Peds  Hematology   Anesthesia Other Findings   Reproductive/Obstetrics                             Anesthesia Physical Anesthesia Plan  ASA: II  Anesthesia Plan: MAC   Post-op Pain Management:    Induction:   Airway Management Planned:   Additional Equipment:   Intra-op Plan:   Post-operative Plan:   Informed Consent: I have reviewed the patients History and Physical, chart, labs and discussed the procedure including the risks, benefits and alternatives for the proposed anesthesia with the patient or authorized representative who has indicated his/her understanding and acceptance.     Plan Discussed with: CRNA  Anesthesia Plan Comments:         Anesthesia Quick Evaluation

## 2014-12-20 NOTE — H&P (Signed)
[facility]  [address] [place], [place] [address] Phone: [phone] Fax : [phone]  Primary Care Physician:  Margarita Rana, MD Primary Gastroenterologist:  Dr. Allen Norris  Pre-Procedure History & Physical: HPI:  Debra Hernandez is a 29 y.o. female is here for an colonoscopy.   Past Medical History  Diagnosis Date  . Polycystic ovary syndrome   . History of seasonal allergies   . Blood transfusion 5/11    2 units bld transfused at [facility]  . Kidney stone     passsed stone - no surgery required  . Melanoma ([place]) 2003    on back - used local to removed  . PONV (postoperative nausea and vomiting)   . Depression   . Anxiety   . Anemia     H/O. no current issues  . Wears contact lenses     Past Surgical History  Procedure Laterality Date  . Laporascopy    . Tonsillectomy    . Tonsillectomy and adenoidectomy    . Diagnostic laparoscopy      ovarian cyst removed  . Wisdom tooth extraction      2 lower extracted  . Svd  04/2006    x 1 - [facility]  . Scar revision  02/02/2011    Procedure: SCAR REVISION;  Surgeon: Donnamae Jude, MD;  Location: [place] [facility];  Service: Gynecology;  Laterality: N/A;  . Laparoscopic bilateral salpingectomy Bilateral 07/20/2012    Procedure: LAPAROSCOPIC BILATERAL SALPINGECTOMY;  Surgeon: Osborne Oman, MD;  Location: [facility];  Service: Gynecology;  Laterality: Bilateral;  . Novasure ablation N/A 07/20/2012    Procedure: NOVASURE ABLATION;  Surgeon: Osborne Oman, MD;  Location: [place] [facility];  Service: Gynecology;  Laterality: N/A;  . Laparoscopic hysterectomy Left 04/18/2013    Procedure: HYSTERECTOMY TOTAL LAPAROSCOPIC and  left salpingectomy;  Surgeon: Donnamae Jude, MD;  Location: [place] [facility];  Service: Gynecology;  Laterality: Left;  . Abdominal hysterectomy  2015  . Cesarean section  05/2009    at 39 wks at [facility],  lived only 12 days  . Cesarean section  02/02/2011    Procedure: CESAREAN  SECTION;  Surgeon: Donnamae Jude, MD;  Location: [facility];  Service: Gynecology;  Laterality: N/A;  Repeart c/section    Prior to Admission medications   Medication Sig Start Date End Date Taking? Authorizing Provider  buPROPion (WELLBUTRIN SR) 150 MG 12 hr tablet Take 1 tablet by mouth 2 (two) times daily. Only takes in AM 10/15/13  Yes Historical Provider, MD  cetirizine (ZYRTEC) 10 MG tablet Take 10 mg by mouth daily.   Yes Historical Provider, MD  ferrous sulfate 325 (65 FE) MG tablet Take 325 mg by mouth. Reported on 12/18/2014   Yes Historical Provider, MD  MULTIPLE VITAMIN PO Take 1 tablet by mouth daily.   Yes Historical Provider, MD  polyvinyl alcohol (LIQUIFILM TEARS) 1.4 % ophthalmic solution Place 1 drop into both eyes as needed for dry eyes.   Yes Historical Provider, MD  valACYclovir (VALTREX) 1000 MG tablet Take 1 tablet (1,000 mg total) by mouth 3 (three) times daily. Patient taking differently: Take 1,000 mg by mouth as needed.  12/11/14  Yes Osborne Oman, MD    Allergies as of 12/17/2014 - Review Complete 12/17/2014  Allergen Reaction Noted  . Atomoxetine Other (See Comments) 12/17/2014  . Strattera [atomoxetine hcl] Nausea And Vomiting and Other (See Comments) 07/31/2010    Family History  Problem Relation  Age of Onset  . Cancer Mother     Ovarian  . Parkinson's disease Mother   . Mental illness Mother   . Depression Mother   . Cancer Maternal Grandmother     Ovarian  . Parkinson's disease Maternal Grandmother   . Diabetes Maternal Grandfather   . Anesthesia problems Neg Hx   . Hypotension Neg Hx   . Malignant hyperthermia Neg Hx   . Pseudochol deficiency Neg Hx   . Cancer Father     prostate  . Cancer Paternal Aunt 72    Breast    Social History   Social History  . Marital Status: Married    Spouse Name: N/A  . Number of Children: N/A  . Years of Education: N/A   Occupational History  . Not on file.   Social History Main Topics  . Smoking  status: Never Smoker   . Smokeless tobacco: Never Used  . Alcohol Use: Yes     Comment: socially. 1/3 months  . Drug Use: No  . Sexual Activity:    Partners: Male    Birth Control/ Protection: None     Comment: pregnant   Other Topics Concern  . Not on file   Social History Narrative    Review of Systems: See HPI, otherwise negative ROS  Physical Exam: BP 107/71 mmHg  Pulse 81  Temp(Src) 97.7 F (36.5 C) (Temporal)  Resp 16  Ht 5\' 8"  (1.727 m)  Wt 184 lb (83.462 kg)  BMI 27.98 kg/m2  SpO2 98%  LMP 03/23/2013 General:   Alert,  pleasant and cooperative in NAD Head:  Normocephalic and atraumatic. Neck:  Supple; no masses or thyromegaly. Lungs:  Clear throughout to auscultation.    Heart:  Regular rate and rhythm. Abdomen:  Soft, nontender and nondistended. Normal bowel sounds, without guarding, and without rebound.   Neurologic:  Alert and  oriented x4;  grossly normal neurologically.  Impression/Plan: Debra Hernandez is here for an colonoscopy to be performed for hematochezia and change in bowel habits.  Risks, benefits, limitations, and alternatives regarding  colonoscopy have been reviewed with the patient.  Questions have been answered.  All parties agreeable.   Ollen Bowl, MD  12/20/2014, 7:24 AM

## 2014-12-20 NOTE — Transfer of Care (Signed)
Immediate Anesthesia Transfer of Care Note  Patient: Debra Hernandez  Procedure(s) Performed: Procedure(s): COLONOSCOPY WITH PROPOFOL (N/A)  Patient Location: PACU  Anesthesia Type: MAC  Level of Consciousness: awake, alert  and patient cooperative  Airway and Oxygen Therapy: Patient Spontanous Breathing and Patient connected to supplemental oxygen  Post-op Assessment: Post-op Vital signs reviewed, Patient's Cardiovascular Status Stable, Respiratory Function Stable, Patent Airway and No signs of Nausea or vomiting  Post-op Vital Signs: Reviewed and stable  Complications: No apparent anesthesia complications

## 2014-12-20 NOTE — Anesthesia Procedure Notes (Signed)
Procedure Name: MAC Performed by: Johne Buckle Pre-anesthesia Checklist: Patient identified, Emergency Drugs available, Suction available, Patient being monitored and Timeout performed Patient Re-evaluated:Patient Re-evaluated prior to inductionOxygen Delivery Method: Nasal cannula Placement Confirmation: positive ETCO2 and breath sounds checked- equal and bilateral     

## 2014-12-23 ENCOUNTER — Encounter: Payer: Self-pay | Admitting: Gastroenterology

## 2015-01-01 ENCOUNTER — Other Ambulatory Visit: Payer: Self-pay | Admitting: Family Medicine

## 2015-01-01 DIAGNOSIS — R635 Abnormal weight gain: Secondary | ICD-10-CM

## 2015-01-01 MED ORDER — PHENTERMINE HCL 30 MG PO CAPS: 30.0000 mg | ORAL_CAPSULE | ORAL | Status: DC

## 2015-01-01 NOTE — Telephone Encounter (Signed)
Printed.  Please notify patient. Thanks.  

## 2015-01-01 NOTE — Telephone Encounter (Signed)
Pt contacted office for refill request on the following medications:  Phentermine HCI 30mg .  [facility].  [id number]

## 2015-03-20 ENCOUNTER — Telehealth: Payer: Self-pay | Admitting: *Deleted

## 2015-03-20 NOTE — Telephone Encounter (Signed)
Pt called wanting lab slip so she could have labs drawn at external facility. Not sure which labs are needed - [facility] for pt to rtn call

## 2015-05-21 ENCOUNTER — Encounter: Payer: Self-pay | Admitting: Physician Assistant

## 2015-05-21 ENCOUNTER — Ambulatory Visit (INDEPENDENT_AMBULATORY_CARE_PROVIDER_SITE_OTHER): Payer: Managed Care, Other (non HMO) | Admitting: Physician Assistant

## 2015-05-21 VITALS — BP 92/56 | HR 92 | Temp 98.3°F | Resp 16 | Ht 68.0 in | Wt 186.2 lb

## 2015-05-21 DIAGNOSIS — Z713 Dietary counseling and surveillance: Secondary | ICD-10-CM | POA: Diagnosis not present

## 2015-05-21 DIAGNOSIS — E669 Obesity, unspecified: Secondary | ICD-10-CM | POA: Diagnosis not present

## 2015-05-21 DIAGNOSIS — Z6828 Body mass index (BMI) 28.0-28.9, adult: Secondary | ICD-10-CM | POA: Diagnosis not present

## 2015-05-21 DIAGNOSIS — E663 Overweight: Secondary | ICD-10-CM | POA: Insufficient documentation

## 2015-05-21 DIAGNOSIS — R635 Abnormal weight gain: Secondary | ICD-10-CM

## 2015-05-21 MED ORDER — PHENTERMINE HCL 37.5 MG PO TABS: 37.5000 mg | ORAL_TABLET | Freq: Every day | ORAL | Status: DC

## 2015-05-21 NOTE — Patient Instructions (Signed)

## 2015-05-21 NOTE — Progress Notes (Signed)
Patient: Debra Hernandez Female    DOB: [birth year]   30 y.o.   MRN: [id number] Visit Date: 05/21/2015  Today's Provider: Mar Daring, PA-C   Chief Complaint  Patient presents with  . Weight Gain   Subjective:    HPI Patient comes in office today to reestablish care, patient's previous PCP was Dr. Margarita Rana. Patient would like to address today issues with fluctuating weight gain over the past several months. Patient reports that she was previously prescribed Phentermine and since discontinuing medication a month ago she has gained a total of 15lbs. Patient states that she is active at work walking daily and she exercised at home 3x a week. Patient would like to discuss today medication options and possibly being placed back on Phentermine.   She has used phentermine twice in the past. The first time she used phentermine she lost a total of 70-80 pounds. She did gain back 30 pounds after discontinuing this. She was then placed back on phentermine again and lost the 30 pounds. Since discontinuing a few months back she has gained back 15 pounds. She does not use a food diary but states that she does try to control her portion sizes and eat healthy.    Allergies  Allergen Reactions  . Atomoxetine Other (See Comments)  . Strattera [Atomoxetine Hcl] Nausea And Vomiting and Other (See Comments)    insomnia   Previous Medications   BUPROPION (WELLBUTRIN SR) 150 MG 12 HR TABLET    Take 1 tablet by mouth 2 (two) times daily. Only takes in AM   CETIRIZINE (ZYRTEC) 10 MG TABLET    Take 10 mg by mouth daily.   FERROUS SULFATE 325 (65 FE) MG TABLET    Take 325 mg by mouth. Reported on 12/18/2014   MULTIPLE VITAMIN PO    Take 1 tablet by mouth daily.   POLYVINYL ALCOHOL (LIQUIFILM TEARS) 1.4 % OPHTHALMIC SOLUTION    Place 1 drop into both eyes as needed for dry eyes. Reported on 05/21/2015   VALACYCLOVIR (VALTREX) 1000 MG TABLET    Take 1 tablet (1,000 mg total) by mouth 3 (three)  times daily.    Review of Systems  Constitutional: Positive for unexpected weight change. Negative for activity change, appetite change and fatigue.  HENT: Negative.   Respiratory: Negative.   Cardiovascular: Negative.   Gastrointestinal: Negative.   Musculoskeletal: Negative.   Neurological: Negative.   Psychiatric/Behavioral: Negative.     Social History  Substance Use Topics  . Smoking status: Never Smoker   . Smokeless tobacco: Never Used  . Alcohol Use: Yes     Comment: socially. 1/3 months   Objective:   BP 92/56 mmHg  Pulse 92  Temp(Src) 98.3 F (36.8 C) (Oral)  Resp 16  Ht 5\' 8"  (1.727 m)  Wt 186 lb 3.2 oz (84.46 kg)  BMI 28.32 kg/m2  SpO2 96%  LMP 03/23/2013  Physical Exam  Constitutional: She appears well-developed and well-nourished. No distress.  Neck: Normal range of motion. Neck supple. No tracheal deviation present. No thyromegaly present.  Cardiovascular: Normal rate, regular rhythm and normal heart sounds.  Exam reveals no gallop and no friction rub.   No murmur heard. Pulmonary/Chest: Effort normal and breath sounds normal. No respiratory distress. She has no wheezes. She has no rales.  Lymphadenopathy:    She has no cervical adenopathy.  Skin: She is not diaphoretic.  Psychiatric: She has a normal mood and affect. Her behavior  is normal. Judgment and thought content normal.  Vitals reviewed.       Assessment & Plan:     1. Encounter for weight loss counseling I did discuss with her that it could be very important for her to add a food diary to make sure that she is not overeating. I did advise her to try to limit to a 1200-calorie diet with a goal of 1-2 pound weight loss per week. She is to continue her exercise as she is doing at this time. I will add phentermine as below for appetite suppression. I will see her back in 4 weeks to evaluate how she is doing with her weight loss goals. - phentermine (ADIPEX-P) 37.5 MG tablet; Take 1 tablet (37.5  mg total) by mouth daily before breakfast.  Dispense: 30 tablet; Refill: 0  2. Abnormal weight gain See above medical treatment plan. - phentermine (ADIPEX-P) 37.5 MG tablet; Take 1 tablet (37.5 mg total) by mouth daily before breakfast.  Dispense: 30 tablet; Refill: 0  3. BMI 28.0-28.9,adult See above medical treatment plan.  4. Obese See above medical treatment plan. - phentermine (ADIPEX-P) 37.5 MG tablet; Take 1 tablet (37.5 mg total) by mouth daily before breakfast.  Dispense: 30 tablet; Refill: 0       Mar Daring, PA-C  [facility]

## 2015-06-18 ENCOUNTER — Encounter: Payer: Self-pay | Admitting: Physician Assistant

## 2015-06-18 ENCOUNTER — Ambulatory Visit (INDEPENDENT_AMBULATORY_CARE_PROVIDER_SITE_OTHER): Payer: Managed Care, Other (non HMO) | Admitting: Physician Assistant

## 2015-06-18 VITALS — BP 110/68 | HR 81 | Temp 98.1°F | Resp 16 | Wt 182.8 lb

## 2015-06-18 DIAGNOSIS — E669 Obesity, unspecified: Secondary | ICD-10-CM | POA: Diagnosis not present

## 2015-06-18 DIAGNOSIS — R635 Abnormal weight gain: Secondary | ICD-10-CM

## 2015-06-18 DIAGNOSIS — Z713 Dietary counseling and surveillance: Secondary | ICD-10-CM | POA: Diagnosis not present

## 2015-06-18 MED ORDER — PHENTERMINE HCL 37.5 MG PO TABS: 37.5000 mg | ORAL_TABLET | Freq: Every day | ORAL | Status: DC

## 2015-06-18 NOTE — Progress Notes (Signed)
Patient: Debra Hernandez Female    DOB: 1985-05-02   30 y.o.   MRN: [id number] Visit Date: 06/18/2015  Today's Provider: Mar Daring, PA-C   Chief Complaint  Patient presents with  . Follow-up    Obesity   Subjective:    HPI Debra Hernandez is here to follow-up obesity. She was prescribed phentermine 37.5 mg. She was 186 lbs and she is 182.8 lbs today. She reports no side effect from the Phentermine. She is not keeping a food diary or counting diary but overall she feels that she eats healthy as she has been to a nutritionist in the past and is using tips and tricks from then.  Current Exercise Habits: Home exercise routine;The patient does not participate in regular exercise at present, Type of exercise: walking (walks an run and does home stretches), Time (Minutes): 60, Frequency (Times/Week): 5, Weekly Exercise (Minutes/Week): 300, Intensity: Moderate Exercise limited by: None identified     Allergies  Allergen Reactions  . Atomoxetine Other (See Comments)  . Strattera [Atomoxetine Hcl] Nausea And Vomiting and Other (See Comments)    insomnia   Current Meds  Medication Sig  . buPROPion (WELLBUTRIN SR) 150 MG 12 hr tablet Take 1 tablet by mouth 2 (two) times daily. Only takes in AM  . ferrous sulfate 325 (65 FE) MG tablet Take 325 mg by mouth. Reported on 12/18/2014  . MULTIPLE VITAMIN PO Take 1 tablet by mouth daily.  . phentermine (ADIPEX-P) 37.5 MG tablet Take 1 tablet (37.5 mg total) by mouth daily before breakfast.    Review of Systems  Constitutional: Negative.   Respiratory: Negative.   Cardiovascular: Negative.   Gastrointestinal: Negative.   Psychiatric/Behavioral: Negative.     Social History  Substance Use Topics  . Smoking status: Never Smoker   . Smokeless tobacco: Never Used  . Alcohol Use: Yes     Comment: socially. 1/3 months   Objective:   BP 110/68 mmHg  Pulse 81  Temp(Src) 98.1 F (36.7 C) (Oral)  Resp 16  Wt 182 lb 12.8  oz (82.918 kg)  LMP 03/23/2013  Physical Exam  Constitutional: She appears well-developed and well-nourished. No distress.  Neck: Normal range of motion. Neck supple. No JVD present. No tracheal deviation present. No thyromegaly present.  Cardiovascular: Normal rate, regular rhythm and normal heart sounds.  Exam reveals no gallop and no friction rub.   No murmur heard. Pulmonary/Chest: Effort normal and breath sounds normal. No respiratory distress. She has no wheezes. She has no rales.  Lymphadenopathy:    She has no cervical adenopathy.  Skin: She is not diaphoretic.  Psychiatric: She has a normal mood and affect. Her behavior is normal. Judgment and thought content normal.  Vitals reviewed.       Assessment & Plan:     1. Obese She has done well the 1st 4 weeks with phentermine. She is worried about gaining weight back again like she has in the past. We discussed making these lifestyle changes to stick with long-term, not short-term goals that are not sustainable. She voiced understanding. Will continue phentermine again and will f/u in 4 weeks. If she begins to not lose, may add metformin for insulin resistance associated with PCOS to see if that helps with weight loss also.  - phentermine (ADIPEX-P) 37.5 MG tablet; Take 1 tablet (37.5 mg total) by mouth daily before breakfast.  Dispense: 30 tablet; Refill: 0  2. Encounter for weight loss counseling See  above medical treatment plan. - phentermine (ADIPEX-P) 37.5 MG tablet; Take 1 tablet (37.5 mg total) by mouth daily before breakfast.  Dispense: 30 tablet; Refill: 0  3. Abnormal weight gain See above medical treatment plan. - phentermine (ADIPEX-P) 37.5 MG tablet; Take 1 tablet (37.5 mg total) by mouth daily before breakfast.  Dispense: 30 tablet; Refill: 0       Mar Daring, PA-C  [facility]

## 2015-06-18 NOTE — Patient Instructions (Signed)

## 2015-07-16 ENCOUNTER — Ambulatory Visit: Payer: Managed Care, Other (non HMO) | Admitting: Physician Assistant

## 2015-07-23 ENCOUNTER — Ambulatory Visit: Payer: Self-pay | Admitting: Physician Assistant

## 2015-08-04 ENCOUNTER — Ambulatory Visit (INDEPENDENT_AMBULATORY_CARE_PROVIDER_SITE_OTHER): Payer: Managed Care, Other (non HMO) | Admitting: Physician Assistant

## 2015-08-04 ENCOUNTER — Encounter: Payer: Self-pay | Admitting: Physician Assistant

## 2015-08-04 VITALS — BP 102/68 | HR 68 | Temp 98.4°F | Resp 16 | Ht 67.0 in | Wt 186.0 lb

## 2015-08-04 DIAGNOSIS — E282 Polycystic ovarian syndrome: Secondary | ICD-10-CM

## 2015-08-04 DIAGNOSIS — Z6829 Body mass index (BMI) 29.0-29.9, adult: Secondary | ICD-10-CM | POA: Diagnosis not present

## 2015-08-04 DIAGNOSIS — E669 Obesity, unspecified: Secondary | ICD-10-CM | POA: Diagnosis not present

## 2015-08-04 DIAGNOSIS — Z713 Dietary counseling and surveillance: Secondary | ICD-10-CM | POA: Diagnosis not present

## 2015-08-04 MED ORDER — METFORMIN HCL 500 MG PO TABS: 500.0000 mg | ORAL_TABLET | Freq: Every day | ORAL | 1 refills | Status: DC

## 2015-08-04 MED ORDER — PHENTERMINE HCL 37.5 MG PO TABS: 37.5000 mg | ORAL_TABLET | Freq: Every day | ORAL | 0 refills | Status: DC

## 2015-08-04 NOTE — Progress Notes (Signed)
Patient: Debra Hernandez Female    DOB: [birth year]   30 y.o.   MRN: [id number] Visit Date: 08/04/2015  Today's Provider: Mar Daring, PA-C   Chief Complaint  Patient presents with  . Obesity   Subjective:    HPI   Pt coming in for a follow up on weight loss counseling.  She started Phentermine approx two months ago.  She says she tolerates the medicine very well with no side effects.  She did run out of the medication about two weeks ago and has not had it refilled yet.  She says since being off Phentermine she has gained some of her weight back.  She would like a refill.  She was 182.6 pounds at last check and has gained back to 186 pounds.  Pt also reports not needing Wellbutrin anymore; She stated her Anxiety/Depression is under good control right now.   She is also scheduled to have plastic surgery (liposuction, smoothing scar tissue from c-sections and breast lift) on 09/05/15 with Dr. Fredric Mare in [place], [place].     Allergies  Allergen Reactions  . Atomoxetine Other (See Comments)  . Strattera [Atomoxetine Hcl] Nausea And Vomiting and Other (See Comments)    insomnia   Current Meds  Medication Sig  . cetirizine (ZYRTEC) 10 MG tablet Take 10 mg by mouth daily. Reported on 06/18/2015  . ferrous sulfate 325 (65 FE) MG tablet Take 325 mg by mouth. Reported on 12/18/2014  . MULTIPLE VITAMIN PO Take 1 tablet by mouth daily.  . phentermine (ADIPEX-P) 37.5 MG tablet Take 1 tablet (37.5 mg total) by mouth daily before breakfast.  . polyvinyl alcohol (LIQUIFILM TEARS) 1.4 % ophthalmic solution Place 1 drop into both eyes as needed for dry eyes. Reported on 06/18/2015  . valACYclovir (VALTREX) 1000 MG tablet Take 1 tablet (1,000 mg total) by mouth 3 (three) times daily.    Review of Systems  Constitutional: Negative.   Respiratory: Negative.   Cardiovascular: Negative.   Gastrointestinal: Negative.   Musculoskeletal: Negative.   Neurological: Negative for dizziness,  light-headedness and headaches.  Psychiatric/Behavioral: Negative.     Social History  Substance Use Topics  . Smoking status: Never Smoker  . Smokeless tobacco: Never Used  . Alcohol use Yes     Comment: socially. 1/3 months   Objective:   BP 102/68 (BP Location: Right Arm, Patient Position: Sitting, Cuff Size: Large)   Pulse 68   Temp 98.4 F (36.9 C) (Oral)   Resp 16   Ht 5\' 7"  (1.702 m)   Wt 186 lb (84.4 kg)   LMP 03/23/2013   BMI 29.13 kg/m   Physical Exam  Constitutional: She appears well-developed and well-nourished. No distress.  Cardiovascular: Normal rate, regular rhythm and normal heart sounds.  Exam reveals no gallop and no friction rub.   No murmur heard. Pulmonary/Chest: Effort normal and breath sounds normal. No respiratory distress. She has no wheezes. She has no rales.  Skin: She is not diaphoretic.  Psychiatric: She has a normal mood and affect. Her behavior is normal. Judgment and thought content normal.  Vitals reviewed.     Assessment & Plan:     1. Encounter for weight loss counseling Will continue phentermine as below for appetite suppression. I will see her back in 4 weeks to see how she is doing. She may schedule this right after her plastic surgery so we can see how she is doing from that also if  it is after 09/05/15. She is to call if she has any issues, questions or concerns in the meantime. - phentermine (ADIPEX-P) 37.5 MG tablet; Take 1 tablet (37.5 mg total) by mouth daily before breakfast.  Dispense: 30 tablet; Refill: 0  2. Obese See above medical treatment plan. - phentermine (ADIPEX-P) 37.5 MG tablet; Take 1 tablet (37.5 mg total) by mouth daily before breakfast.  Dispense: 30 tablet; Refill: 0  3. BMI 29.0-29.9,adult See above medical treatment plan.  4. PCOS (polycystic ovarian syndrome) Will add metformin as we have discussed previously for her PCOS and insulin resistance to see if this helps her with weight loss and keeping the  weight off. She gains weight quickly back without having something to help her lose weight. I feel this is most likely secondary to her PCOS. I will see how she is doing with this medication in 4 weeks. - metFORMIN (GLUCOPHAGE) 500 MG tablet; Take 1 tablet (500 mg total) by mouth daily with breakfast.  Dispense: 30 tablet; Refill: Williams, PA-C  [facility]

## 2015-08-04 NOTE — Patient Instructions (Signed)

## 2015-09-10 ENCOUNTER — Encounter: Payer: Self-pay | Admitting: Physician Assistant

## 2015-09-10 ENCOUNTER — Ambulatory Visit (INDEPENDENT_AMBULATORY_CARE_PROVIDER_SITE_OTHER): Payer: Managed Care, Other (non HMO) | Admitting: Physician Assistant

## 2015-09-10 VITALS — BP 92/62 | HR 74 | Temp 98.4°F | Resp 14 | Wt 187.6 lb

## 2015-09-10 DIAGNOSIS — N63 Unspecified lump in breast: Secondary | ICD-10-CM | POA: Diagnosis not present

## 2015-09-10 DIAGNOSIS — Z713 Dietary counseling and surveillance: Secondary | ICD-10-CM | POA: Diagnosis not present

## 2015-09-10 DIAGNOSIS — N6310 Unspecified lump in the right breast, unspecified quadrant: Secondary | ICD-10-CM

## 2015-09-10 DIAGNOSIS — Z6829 Body mass index (BMI) 29.0-29.9, adult: Secondary | ICD-10-CM

## 2015-09-10 DIAGNOSIS — E669 Obesity, unspecified: Secondary | ICD-10-CM | POA: Diagnosis not present

## 2015-09-10 MED ORDER — PHENTERMINE HCL 37.5 MG PO TABS: 37.5000 mg | ORAL_TABLET | Freq: Every day | ORAL | 0 refills | Status: DC

## 2015-09-10 NOTE — Patient Instructions (Signed)

## 2015-09-10 NOTE — Progress Notes (Signed)
Patient: Debra Hernandez Female    DOB: 1985-06-19   30 y.o.   MRN: [id number] Visit Date: 09/10/2015  Today's Provider: Mar Daring, PA-C   Chief Complaint  Patient presents with  . Weight Check   Subjective:    HPI Pt coming in for a follow up on weight loss counseling.  She started Phentermine approx four months ago. She says she tolerates the medicine very well with no side effects. Patient reports she has not been able to take the medication the past 2 weeks due to surgery, but will resume on Monday. She would like a refill because she will be out of medication before next visit. She was 186.0 lb at last check and 187.6 lb today. She has not been able to exercise and is slightly swollen from her recent procedure that she had last Friday.  She had plastic surgery (liposuction, smoothing scar tissue from c-sections and breast lift) on 09/05/15 with Dr. Fredric Mare in [place], [place]. She is still swollen and bruised from the procedure. She does have a complaint of the right breast. Patient reports right breast there is a knot or harder area in the upper, outer corner with redness and painful to touch since Sunday.      Previous Medications   CETIRIZINE (ZYRTEC) 10 MG TABLET    Take 10 mg by mouth daily. Reported on 06/18/2015   FERROUS SULFATE 325 (65 FE) MG TABLET    Take 325 mg by mouth. Reported on 12/18/2014   METFORMIN (GLUCOPHAGE) 500 MG TABLET    Take 1 tablet (500 mg total) by mouth daily with breakfast.   MULTIPLE VITAMIN PO    Take 1 tablet by mouth daily.   POLYVINYL ALCOHOL (LIQUIFILM TEARS) 1.4 % OPHTHALMIC SOLUTION    Place 1 drop into both eyes as needed for dry eyes. Reported on 06/18/2015   VALACYCLOVIR (VALTREX) 1000 MG TABLET    Take 1 tablet (1,000 mg total) by mouth 3 (three) times daily.    Review of Systems  Constitutional: Negative.   Respiratory: Negative.   Cardiovascular: Positive for chest pain (tenderness from surgery; right breast pain).  Gastrointestinal:  Negative.   Neurological: Negative.   Psychiatric/Behavioral: Negative.     Social History  Substance Use Topics  . Smoking status: Never Smoker  . Smokeless tobacco: Never Used  . Alcohol use Yes     Comment: socially. 1/3 months   Objective:   BP 92/62 (BP Location: Right Arm, Patient Position: Sitting, Cuff Size: Normal)   Pulse 74   Temp 98.4 F (36.9 C) (Oral)   Resp 14   Wt 187 lb 9.6 oz (85.1 kg)   LMP 03/23/2013   BMI 29.38 kg/m   Physical Exam  Constitutional: She appears well-developed and well-nourished. No distress.  Cardiovascular: Normal rate, regular rhythm and normal heart sounds.  Exam reveals no gallop and no friction rub.   No murmur heard. Pulmonary/Chest: Effort normal and breath sounds normal. No respiratory distress. She has no wheezes. She has no rales. She exhibits tenderness. Right breast exhibits skin change and tenderness. Right breast exhibits no inverted nipple, no mass and no nipple discharge. Left breast exhibits no inverted nipple, no mass, no nipple discharge, no skin change and no tenderness. Breasts are symmetrical.    Bruising noted along rib cage on right side  Skin: She is not diaphoretic.  Psychiatric: She has a normal mood and affect. Her behavior is normal. Judgment and thought content normal.  Vitals  reviewed.     Assessment & Plan:     1. Encounter for weight loss counseling New Rx of phentermine given for her to have to start once she is cleared by her plastic surgeon. I will see her back in 6 weeks to recheck weight at that time. - phentermine (ADIPEX-P) 37.5 MG tablet; Take 1 tablet (37.5 mg total) by mouth daily before breakfast.  Dispense: 30 tablet; Refill: 0  2. Obese See above medical treatment plan. - phentermine (ADIPEX-P) 37.5 MG tablet; Take 1 tablet (37.5 mg total) by mouth daily before breakfast.  Dispense: 30 tablet; Refill: 0  3. BMI 29.0-29.9,adult See above medical treatment plan.  4. Breast mass,  right Area on right breast is felt to be inflammatory response secondary to surgery last Friday. If it worsens, she develops fever, or other signs of infection she is to call. She is already on keflex 500mg  at this time.    Follow up: Return in about 6 weeks (around 10/22/2015) for weight.

## 2015-10-20 ENCOUNTER — Other Ambulatory Visit: Payer: Self-pay | Admitting: Physician Assistant

## 2015-10-20 DIAGNOSIS — E282 Polycystic ovarian syndrome: Secondary | ICD-10-CM

## 2015-10-22 ENCOUNTER — Ambulatory Visit: Payer: Managed Care, Other (non HMO) | Admitting: Physician Assistant

## 2015-10-23 ENCOUNTER — Ambulatory Visit (INDEPENDENT_AMBULATORY_CARE_PROVIDER_SITE_OTHER): Payer: Managed Care, Other (non HMO) | Admitting: Physician Assistant

## 2015-10-23 ENCOUNTER — Encounter: Payer: Self-pay | Admitting: Physician Assistant

## 2015-10-23 VITALS — BP 110/66 | HR 80 | Temp 98.0°F | Resp 16 | Ht 67.0 in | Wt 176.0 lb

## 2015-10-23 DIAGNOSIS — E663 Overweight: Secondary | ICD-10-CM | POA: Diagnosis not present

## 2015-10-23 DIAGNOSIS — Z713 Dietary counseling and surveillance: Secondary | ICD-10-CM

## 2015-10-23 DIAGNOSIS — Z6827 Body mass index (BMI) 27.0-27.9, adult: Secondary | ICD-10-CM

## 2015-10-23 MED ORDER — PHENTERMINE HCL 37.5 MG PO TABS: 37.5000 mg | ORAL_TABLET | Freq: Every day | ORAL | 0 refills | Status: DC

## 2015-10-23 NOTE — Patient Instructions (Signed)

## 2015-10-23 NOTE — Progress Notes (Signed)
Patient: Debra Hernandez Female    DOB: [birth year]   30 y.o.   MRN: [id number] Visit Date: 10/23/2015  Today's Provider: Mar Daring, PA-C   Chief Complaint  Patient presents with  . Follow-up    weight   Subjective:    HPI Follow up for weight loss The patient was last seen for this 6 weeks ago. Changes made at last visit include start phentermine.  She reports excellent compliance with treatment. She feels that condition is Improved. She is not having side effects.   She also is doing well from her plastic surgery. She is not swollen any longer. She is still limited in her activity (no running or heavy lifting) but has been doing well with other activities.  ------------------------------------------------------------------------------------    Allergies  Allergen Reactions  . Atomoxetine Other (See Comments)  . Strattera [Atomoxetine Hcl] Nausea And Vomiting and Other (See Comments)    insomnia     Current Outpatient Prescriptions:  .  cetirizine (ZYRTEC) 10 MG tablet, Take 10 mg by mouth daily. Reported on 06/18/2015, Disp: , Rfl:  .  metFORMIN (GLUCOPHAGE) 500 MG tablet, TAKE 1 TABLET (500 MG TOTAL) BY MOUTH DAILY WITH BREAKFAST., Disp: 30 tablet, Rfl: 5 .  MULTIPLE VITAMIN PO, Take 1 tablet by mouth daily., Disp: , Rfl:  .  phentermine (ADIPEX-P) 37.5 MG tablet, Take 1 tablet (37.5 mg total) by mouth daily before breakfast., Disp: 30 tablet, Rfl: 0 .  polyvinyl alcohol (LIQUIFILM TEARS) 1.4 % ophthalmic solution, Place 1 drop into both eyes as needed for dry eyes. Reported on 06/18/2015, Disp: , Rfl:  .  valACYclovir (VALTREX) 1000 MG tablet, Take 1 tablet (1,000 mg total) by mouth 3 (three) times daily., Disp: 90 tablet, Rfl: 3  Review of Systems  Constitutional: Negative.   Respiratory: Negative.   Cardiovascular: Negative.   Gastrointestinal: Negative.   Neurological: Negative.   Psychiatric/Behavioral: Negative.     Social History    Substance Use Topics  . Smoking status: Never Smoker  . Smokeless tobacco: Never Used  . Alcohol use Yes     Comment: socially. 1/3 months   Objective:   BP 110/66 (BP Location: Left Arm, Patient Position: Sitting, Cuff Size: Large)   Pulse 80   Temp 98 F (36.7 C) (Oral)   Resp 16   Ht 5\' 7"  (1.702 m)   Wt 176 lb (79.8 kg)   LMP 03/23/2013   BMI 27.57 kg/m   Physical Exam  Constitutional: She appears well-developed and well-nourished. No distress.  Neck: Normal range of motion. Neck supple.  Cardiovascular: Normal rate, regular rhythm and normal heart sounds.  Exam reveals no gallop and no friction rub.   No murmur heard. Pulmonary/Chest: Effort normal and breath sounds normal. No respiratory distress. She has no wheezes. She has no rales.  Skin: She is not diaphoretic.  Psychiatric: She has a normal mood and affect. Her behavior is normal. Judgment and thought content normal.  Vitals reviewed.     Assessment & Plan:     1. Encounter for weight loss counseling Doing very well. Lost 10 pounds since last visit. Continue phentermine and metformin for weight loss. Continue eating small, healthy meals more frequently. Add exercise as tolerated and allowed by plastic surgeon. I will see her back in 4 weeks to make sure she is still doing well. If so I will give her 3 months on her own. - phentermine (ADIPEX-P) 37.5 MG tablet; Take 1  tablet (37.5 mg total) by mouth daily before breakfast.  Dispense: 30 tablet; Refill: 0  2. BMI 27.0-27.9,adult See above medical treatment plan. - phentermine (ADIPEX-P) 37.5 MG tablet; Take 1 tablet (37.5 mg total) by mouth daily before breakfast.  Dispense: 30 tablet; Refill: 0  3. Overweight (BMI 25.0-29.9) See above medical treatment plan. - phentermine (ADIPEX-P) 37.5 MG tablet; Take 1 tablet (37.5 mg total) by mouth daily before breakfast.  Dispense: 30 tablet; Refill: 0       Mar Daring, PA-C  [facility]

## 2015-11-04 ENCOUNTER — Telehealth: Payer: Self-pay | Admitting: Physician Assistant

## 2015-11-04 DIAGNOSIS — E282 Polycystic ovarian syndrome: Secondary | ICD-10-CM

## 2015-11-04 MED ORDER — METFORMIN HCL 500 MG PO TABS: 500.0000 mg | ORAL_TABLET | Freq: Every day | ORAL | 3 refills | Status: DC

## 2015-11-04 NOTE — Telephone Encounter (Signed)
Metformin sent to [facility]

## 2015-11-04 NOTE — Telephone Encounter (Signed)
Pt advised.

## 2015-11-04 NOTE — Telephone Encounter (Signed)
Pt states per [facility] the Rx for metFORMIN (GLUCOPHAGE) 500 MG tablet needs to be sent to [facility] mail order in order to be covered.  Pt is requesting this resent to mail order.  [id number]

## 2015-11-20 ENCOUNTER — Ambulatory Visit: Payer: Managed Care, Other (non HMO) | Admitting: Physician Assistant

## 2015-11-24 ENCOUNTER — Ambulatory Visit: Payer: Managed Care, Other (non HMO) | Admitting: Physician Assistant

## 2015-12-01 ENCOUNTER — Telehealth: Payer: Self-pay | Admitting: Physician Assistant

## 2015-12-01 DIAGNOSIS — B001 Herpesviral vesicular dermatitis: Secondary | ICD-10-CM

## 2015-12-01 DIAGNOSIS — Z713 Dietary counseling and surveillance: Secondary | ICD-10-CM

## 2015-12-01 DIAGNOSIS — Z6827 Body mass index (BMI) 27.0-27.9, adult: Secondary | ICD-10-CM

## 2015-12-01 DIAGNOSIS — E663 Overweight: Secondary | ICD-10-CM

## 2015-12-01 MED ORDER — PHENTERMINE HCL 37.5 MG PO TABS: 37.5000 mg | ORAL_TABLET | Freq: Every day | ORAL | 0 refills | Status: DC

## 2015-12-01 MED ORDER — VALACYCLOVIR HCL 1 G PO TABS: ORAL_TABLET | ORAL | 1 refills | Status: DC

## 2015-12-01 NOTE — Telephone Encounter (Signed)
Valtrex sent to [facility] and phentermine 37.5 mg may be called in to [facility] (Take 1 tab PO daily #30 NR). I will not give 90 day supply until after a weight check but this will give her time to get settled from fracture to schedule a f/u.

## 2015-12-01 NOTE — Telephone Encounter (Signed)
Please review and advise. KW 

## 2015-12-01 NOTE — Telephone Encounter (Signed)
Notified patient of message as below and called in phentermine to patients pharmacy. KW

## 2015-12-01 NOTE — Telephone Encounter (Signed)
Pt contacted office for refill request on the following medications: 1. phentermine (ADIPEX-P) 37.5 MG tablet  2. valACYclovir (VALTREX) 1000 MG tablet [facility] Dr. [name] stated that the last time she was in the office 10/23/15 she was advised if she is still doing well on the Phentermine she could get a 90 day supply. Pt stated that she doesn't know when she can get into the office for a F/U b/c she fractured her fibula on 11/19/15. Pt stated that Dr. Venia Minks had prescribed the Valtrex in the past for fever blisters in the corner of or her upper lip. Please advise. Thanks TNP

## 2016-01-15 ENCOUNTER — Ambulatory Visit: Payer: Managed Care, Other (non HMO) | Admitting: Physician Assistant

## 2016-01-23 ENCOUNTER — Ambulatory Visit (INDEPENDENT_AMBULATORY_CARE_PROVIDER_SITE_OTHER): Payer: Managed Care, Other (non HMO) | Admitting: Physician Assistant

## 2016-01-23 ENCOUNTER — Encounter: Payer: Self-pay | Admitting: Physician Assistant

## 2016-01-23 VITALS — BP 100/70 | HR 82 | Temp 98.2°F | Resp 16 | Wt 176.0 lb

## 2016-01-23 DIAGNOSIS — Z6827 Body mass index (BMI) 27.0-27.9, adult: Secondary | ICD-10-CM

## 2016-01-23 DIAGNOSIS — J40 Bronchitis, not specified as acute or chronic: Secondary | ICD-10-CM | POA: Diagnosis not present

## 2016-01-23 DIAGNOSIS — E663 Overweight: Secondary | ICD-10-CM | POA: Diagnosis not present

## 2016-01-23 DIAGNOSIS — Z713 Dietary counseling and surveillance: Secondary | ICD-10-CM | POA: Diagnosis not present

## 2016-01-23 MED ORDER — BENZONATATE 200 MG PO CAPS: 200.0000 mg | ORAL_CAPSULE | Freq: Three times a day (TID) | ORAL | 0 refills | Status: DC | PRN

## 2016-01-23 MED ORDER — HYDROCODONE-HOMATROPINE 5-1.5 MG/5ML PO SYRP: 5.0000 mL | ORAL_SOLUTION | Freq: Three times a day (TID) | ORAL | 0 refills | Status: DC | PRN

## 2016-01-23 MED ORDER — PREDNISONE 10 MG (21) PO TBPK: ORAL_TABLET | ORAL | 0 refills | Status: DC

## 2016-01-23 MED ORDER — PHENTERMINE HCL 37.5 MG PO TABS: 37.5000 mg | ORAL_TABLET | Freq: Every day | ORAL | 3 refills | Status: DC

## 2016-01-23 NOTE — Progress Notes (Signed)
Patient: Debra Hernandez Female    DOB: 1985-05-18   31 y.o.   MRN: [id number] Visit Date: 01/23/2016  Today's Provider: Mar Daring, PA-C   Chief Complaint  Patient presents with  . Cough  . Follow-up    on weight   Subjective:    HPI Patient is c/o cough since last week. Patient reports she was treated for Flu last week.  Patient reports cough is about the same day and night. Patient reports clear runny nose and denies coughing up phlegm. Patient reports she has tried several OTC cough medications and reports no relief.    Follow up for weight  The patient was last seen for this 3 months ago. Changes made at last visit include no changes.  She reports excellent compliance with treatment. She feels that condition is Unchanged. She is not having side effects.  Patient's last weight was 176 on 10/23/15. She has not been using medication or exercising regularly since she fractured her ankle in early December. She is still in an ankle brace but has f/u next week to hopefully be cleared. -----------------------------------------------------------------------------------    Allergies  Allergen Reactions  . Atomoxetine Other (See Comments)  . Strattera [Atomoxetine Hcl] Nausea And Vomiting and Other (See Comments)    insomnia     Current Outpatient Prescriptions:  .  cetirizine (ZYRTEC) 10 MG tablet, Take 10 mg by mouth daily. Reported on 06/18/2015, Disp: , Rfl:  .  metFORMIN (GLUCOPHAGE) 500 MG tablet, Take 1 tablet (500 mg total) by mouth daily with breakfast., Disp: 90 tablet, Rfl: 3 .  MULTIPLE VITAMIN PO, Take 1 tablet by mouth daily., Disp: , Rfl:  .  phentermine (ADIPEX-P) 37.5 MG tablet, Take 1 tablet (37.5 mg total) by mouth daily before breakfast., Disp: 30 tablet, Rfl: 0 .  polyvinyl alcohol (LIQUIFILM TEARS) 1.4 % ophthalmic solution, Place 1 drop into both eyes as needed for dry eyes. Reported on 06/18/2015, Disp: , Rfl:  .  valACYclovir (VALTREX)  1000 MG tablet, Take 2000mg  (2 tabs) PO twice daily x 1 day at onset of cold sore; May repeat as necessary (Patient not taking: Reported on 01/23/2016), Disp: 60 tablet, Rfl: 1  Review of Systems  Constitutional: Positive for chills and fatigue. Negative for fever.  HENT: Positive for congestion, postnasal drip and rhinorrhea. Negative for ear pain, sinus pain, sinus pressure, sore throat (in the beginning but improved) and tinnitus.   Respiratory: Positive for cough. Negative for chest tightness and shortness of breath.   Cardiovascular: Negative.   Gastrointestinal: Negative for abdominal pain and nausea.  Neurological: Positive for headaches. Negative for dizziness.    Social History  Substance Use Topics  . Smoking status: Never Smoker  . Smokeless tobacco: Never Used  . Alcohol use Yes     Comment: socially. 1/3 months   Objective:   BP 100/70 (BP Location: Left Arm, Patient Position: Sitting, Cuff Size: Large)   Pulse 82   Temp 98.2 F (36.8 C) (Oral)   Resp 16   Wt 176 lb (79.8 kg)   LMP 03/23/2013   SpO2 99%   BMI 27.57 kg/m   Physical Exam  Constitutional: She appears well-developed and well-nourished. No distress.  HENT:  Head: Normocephalic and atraumatic.  Right Ear: Hearing, tympanic membrane, external ear and ear canal normal.  Left Ear: Hearing, tympanic membrane, external ear and ear canal normal.  Nose: Nose normal. Right sinus exhibits no maxillary sinus tenderness and no frontal  sinus tenderness. Left sinus exhibits no maxillary sinus tenderness and no frontal sinus tenderness.  Mouth/Throat: Uvula is midline, oropharynx is clear and moist and mucous membranes are normal. No oropharyngeal exudate, posterior oropharyngeal edema or posterior oropharyngeal erythema.  Eyes: Conjunctivae are normal. Pupils are equal, round, and reactive to light. Right eye exhibits no discharge. Left eye exhibits no discharge. No scleral icterus.  Neck: Normal range of motion. Neck  supple. No tracheal deviation present. No thyromegaly present.  Cardiovascular: Normal rate, regular rhythm and normal heart sounds.  Exam reveals no gallop and no friction rub.   No murmur heard. Pulmonary/Chest: Effort normal. No stridor. No respiratory distress. She has no decreased breath sounds. She has wheezes in the left lower field. She has no rhonchi. She has no rales.  Lymphadenopathy:    She has no cervical adenopathy.  Skin: Skin is warm and dry. She is not diaphoretic.  Vitals reviewed.     Assessment & Plan:     1. Bronchitis Worsening symptoms. Discussed it was most likely viral. Prednisone given for inflammation. Tessalon perles given for daytime cough. Hycodan cough syrup given for nighttime cough with drowsiness precautions. She is to call if no improvement. - predniSONE (STERAPRED UNI-PAK 21 TAB) 10 MG (21) TBPK tablet; 6 day taper; take as directed on package instructions  Dispense: 21 tablet; Refill: 0 - benzonatate (TESSALON) 200 MG capsule; Take 1 capsule (200 mg total) by mouth 3 (three) times daily as needed for cough.  Dispense: 30 capsule; Refill: 0 - HYDROcodone-homatropine (HYCODAN) 5-1.5 MG/5ML syrup; Take 5 mLs by mouth every 8 (eight) hours as needed for cough.  Dispense: 120 mL; Refill: 0  2. Encounter for weight loss counseling She has done well maintaining weight loss even without being able to exercise from ankle fracture. Will give 3 month supply of phentermine for her to start once she is cleared by ortho. I will see her back in 3 months for weight recheck.  - phentermine (ADIPEX-P) 37.5 MG tablet; Take 1 tablet (37.5 mg total) by mouth daily before breakfast.  Dispense: 30 tablet; Refill: 3  3. BMI 27.0-27.9,adult See above medical treatment plan. - phentermine (ADIPEX-P) 37.5 MG tablet; Take 1 tablet (37.5 mg total) by mouth daily before breakfast.  Dispense: 30 tablet; Refill: 3  4. Overweight (BMI 25.0-29.9) See above medical treatment plan. -  phentermine (ADIPEX-P) 37.5 MG tablet; Take 1 tablet (37.5 mg total) by mouth daily before breakfast.  Dispense: 30 tablet; Refill: [place], PA-C  [facility]

## 2016-01-23 NOTE — Patient Instructions (Signed)

## 2016-04-19 ENCOUNTER — Encounter: Payer: Self-pay | Admitting: Physician Assistant

## 2016-04-19 ENCOUNTER — Ambulatory Visit (INDEPENDENT_AMBULATORY_CARE_PROVIDER_SITE_OTHER): Payer: Managed Care, Other (non HMO) | Admitting: Physician Assistant

## 2016-04-19 VITALS — BP 100/60 | HR 84 | Temp 98.2°F | Resp 16 | Ht 67.0 in | Wt 176.4 lb

## 2016-04-19 DIAGNOSIS — Z6827 Body mass index (BMI) 27.0-27.9, adult: Secondary | ICD-10-CM | POA: Diagnosis not present

## 2016-04-19 DIAGNOSIS — Z713 Dietary counseling and surveillance: Secondary | ICD-10-CM | POA: Diagnosis not present

## 2016-04-19 DIAGNOSIS — K644 Residual hemorrhoidal skin tags: Secondary | ICD-10-CM | POA: Diagnosis not present

## 2016-04-19 DIAGNOSIS — E663 Overweight: Secondary | ICD-10-CM

## 2016-04-19 DIAGNOSIS — E282 Polycystic ovarian syndrome: Secondary | ICD-10-CM | POA: Diagnosis not present

## 2016-04-19 DIAGNOSIS — R635 Abnormal weight gain: Secondary | ICD-10-CM

## 2016-04-19 MED ORDER — PHENTERMINE HCL 37.5 MG PO TABS: 37.5000 mg | ORAL_TABLET | Freq: Every day | ORAL | 3 refills | Status: DC

## 2016-04-19 NOTE — Patient Instructions (Signed)

## 2016-04-19 NOTE — Progress Notes (Signed)
Patient: Debra Hernandez Female    DOB: 1985-12-20   31 y.o.   MRN: [id number] Visit Date: 04/19/2016  Today's Provider: Mar Daring, PA-C   Chief Complaint  Patient presents with  . Obesity   Subjective:    HPI Patient here to follow-up on weight, last ov was on 10/23/15 wt. 176 lbs. Patient reports good tolerance and compliance of phentermine. Patient reports she is exercising daily walking and total body work out video at home. Patient reports healthy diet. Patient reports she is concerned about her weight not going down even with healthy life style. Patient is requesting labs done to check hormone and thyroid levels.    Patient concerned about food allergies raw food like vegetables and fruits. Patient reports her throat itches and has to cook food before eating.   Wt Readings from Last 3 Encounters:  04/19/16 176 lb 6.4 oz (80 kg)  01/23/16 176 lb (79.8 kg)  10/23/15 176 lb (79.8 kg)   Current Exercise Habits: Home exercise routine, Type of exercise: strength training/weights;walking, Time (Minutes): 60, Frequency (Times/Week): 7, Weekly Exercise (Minutes/Week): 420, Intensity: Moderate    She is also having issues with rectal bleeding. Over the past couple of months she has noticed having an irritation near her anus and also spontaneous bleeding with wiping. She denies pain or itching.     Allergies  Allergen Reactions  . Atomoxetine Other (See Comments)  . Strattera [Atomoxetine Hcl] Nausea And Vomiting and Other (See Comments)    insomnia     Current Outpatient Prescriptions:  .  cetirizine (ZYRTEC) 10 MG tablet, Take 10 mg by mouth daily. Reported on 06/18/2015, Disp: , Rfl:  .  MULTIPLE VITAMIN PO, Take 1 tablet by mouth daily., Disp: , Rfl:  .  phentermine (ADIPEX-P) 37.5 MG tablet, Take 1 tablet (37.5 mg total) by mouth daily before breakfast., Disp: 30 tablet, Rfl: 3 .  polyvinyl alcohol (LIQUIFILM TEARS) 1.4 % ophthalmic solution, Place 1 drop  into both eyes as needed for dry eyes. Reported on 06/18/2015, Disp: , Rfl:  .  metFORMIN (GLUCOPHAGE) 500 MG tablet, Take 1 tablet (500 mg total) by mouth daily with breakfast. (Patient not taking: Reported on 04/19/2016), Disp: 90 tablet, Rfl: 3  Review of Systems  Constitutional: Negative.   HENT: Negative.   Respiratory: Negative.   Cardiovascular: Negative.   Gastrointestinal: Positive for anal bleeding. Negative for constipation, diarrhea and nausea.  Allergic/Immunologic: Positive for environmental allergies.  Neurological: Negative.     Social History  Substance Use Topics  . Smoking status: Never Smoker  . Smokeless tobacco: Never Used  . Alcohol use No   Objective:   BP 100/60 (BP Location: Left Arm, Patient Position: Sitting, Cuff Size: Large)   Pulse 84   Temp 98.2 F (36.8 C) (Oral)   Resp 16   Ht 5\' 7"  (1.702 m)   Wt 176 lb 6.4 oz (80 kg)   LMP 03/23/2013 Comment: DUB  SpO2 99%   BMI 27.63 kg/m  Vitals:   04/19/16 0810  BP: 100/60  Pulse: 84  Resp: 16  Temp: 98.2 F (36.8 C)  TempSrc: Oral  SpO2: 99%  Weight: 176 lb 6.4 oz (80 kg)  Height: 5\' 7"  (1.702 m)     Physical Exam  Constitutional: She appears well-developed and well-nourished. No distress.  Neck: Normal range of motion. Neck supple. No JVD present. No tracheal deviation present. No thyromegaly present.  Cardiovascular: Normal rate, regular rhythm  and normal heart sounds.  Exam reveals no gallop and no friction rub.   No murmur heard. Pulmonary/Chest: Effort normal and breath sounds normal. No respiratory distress. She has no wheezes. She has no rales.  Musculoskeletal: She exhibits no edema.  Lymphadenopathy:    She has no cervical adenopathy.  Skin: She is not diaphoretic.  Vitals reviewed.  GU exam deferred today by patient but states that husband looked at it and stated it was a hemorrhoid or skin tag. She wants referral to have removed. She is not having severe symptoms but request  removal.     Assessment & Plan:     1. Encounter for weight loss counseling She continues to maintain weight at 176 since October. She reports that she will fluctuate with her weight extremely when she does not take phentermine. She reports that most recently she stopped over a weekend and went from 169 to 176. She does have family history of hypothyroidism in her mother and personally has PCOS. She is requesting to have her labs checked to see if there is something that may be causing her to plateau. She is also not using a food diary. I have advised her to try a food diary and just put in what she is eating for a couple of days to make sure she is not either over eating or under eating. She agrees. I will call her once I receive the lab results and will adjust as needed pending results. I will see her back in 3 months. - phentermine (ADIPEX-P) 37.5 MG tablet; Take 1 tablet (37.5 mg total) by mouth daily before breakfast.  Dispense: 30 tablet; Refill: 3  2. BMI 27.0-27.9,adult See above medical treatment plan. - phentermine (ADIPEX-P) 37.5 MG tablet; Take 1 tablet (37.5 mg total) by mouth daily before breakfast.  Dispense: 30 tablet; Refill: 3  3. External hemorrhoid Patient states she notices it slightly when she sits down as it feels she is sitting on something, has occasional bleeding with wiping, no pain and no itching. She is requesting removal. Referral placed as below.  - Ambulatory referral to General Surgery  4. Polycystic ovary syndrome Will check labs as below and f/u pending results. - Thyroid Panel With TSH - Testosterone - Estrogens, Total  5. Abnormal weight gain Will check labs as below and f/u pending results. - Thyroid Panel With TSH - Testosterone - Estrogens, Total  6. Overweight (BMI 25.0-29.9) - phentermine (ADIPEX-P) 37.5 MG tablet; Take 1 tablet (37.5 mg total) by mouth daily before breakfast.  Dispense: 30 tablet; Refill: [place], PA-C    [facility]

## 2016-04-20 ENCOUNTER — Telehealth: Payer: Self-pay | Admitting: General Surgery

## 2016-04-20 NOTE — Telephone Encounter (Signed)
I LEFT MESSAGE FOR PT TO CALL BACK AND SCHEDULE AN APPOINTMENT WITH DR BYRNETT.REF'S BY Fenton Malling NP FOR EXTERNAL HEMORRHOIDS/CIGNA INS/MTH

## 2016-04-22 ENCOUNTER — Telehealth: Payer: Self-pay | Admitting: Physician Assistant

## 2016-04-22 LAB — TESTOSTERONE: Testosterone: 12 ng/dL (ref 8–48)

## 2016-04-22 LAB — ESTROGENS, TOTAL: Estrogen: 137 pg/mL

## 2016-04-22 LAB — THYROID PANEL WITH TSH
Free Thyroxine Index: 1.7 (ref 1.2–4.9)
T3 Uptake Ratio: 21 % — ABNORMAL LOW (ref 24–39)
T4 TOTAL: 7.9 ug/dL (ref 4.5–12.0)
TSH: 2.19 u[IU]/mL (ref 0.450–4.500)

## 2016-04-22 NOTE — Telephone Encounter (Signed)
-----   Message from Mar Daring, [place] sent at 04/22/2016  2:41 PM EDT ----- Labs are fairly unremarkable. TSH did increase some from last year but is still WNL. T3 is borderline low but we normally do not treat this. There is something called low T3 syndrome or Non-thyroidal Illness Syndrome (NTIS) but there is not much research and studies show different treatment options. There is an OTC T3 supplement you could try, some studies show low zinc levels can cause this and zinc supplement can help, but again none are conclusive. Testosterone and estrogen are normal.

## 2016-04-22 NOTE — Telephone Encounter (Signed)
Advised patient of results. Patient will try the OTC T3 supplements and see if her levels get within a normal range. Patient reports that she is having the symptoms of brittle nails, fatigue, headache, and hot/cold sensitivity. Per Tawanna Sat, we will recheck levels in 6 weeks. Patient was advised to call for a lab slip during that time.

## 2016-04-22 NOTE — Telephone Encounter (Signed)
Pt wants to discuss her thyroid levels.  She works a t [employer] and seen them come across and she has noticed some changes.  Her call back is [phone]  Thanks Con Memos

## 2016-04-23 ENCOUNTER — Ambulatory Visit: Payer: Managed Care, Other (non HMO) | Admitting: Physician Assistant

## 2016-04-28 ENCOUNTER — Encounter: Payer: Self-pay | Admitting: General Surgery

## 2016-04-28 ENCOUNTER — Ambulatory Visit (INDEPENDENT_AMBULATORY_CARE_PROVIDER_SITE_OTHER): Payer: Managed Care, Other (non HMO) | Admitting: General Surgery

## 2016-04-28 VITALS — BP 116/70 | HR 72 | Resp 12 | Ht 68.0 in | Wt 172.0 lb

## 2016-04-28 DIAGNOSIS — K644 Residual hemorrhoidal skin tags: Secondary | ICD-10-CM | POA: Diagnosis not present

## 2016-04-28 NOTE — Progress Notes (Signed)
Patient ID: Debra Hernandez, female   DOB: 07-11-1985, 31 y.o.   MRN: [id number]  Chief Complaint  Patient presents with  . Other    hemorrhoids    HPI Debra Hernandez is a 31 y.o. female.  Here for evaluaiton of possible hemorrhoids. She states she can feel extra tissue at her rectum. She states only occasional bleeding but no pain. She noticed this after her hysterectomy in 2015. She states it has gotten larger over the past 9 months. Bowels move daily. She has seen Dr Allen Norris because of her stool consistency "pudding" and colonoscopy wa negative. She has tried OTC preparation H and creams which has not helped. 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  HPI  Past Medical History:  Diagnosis Date  . Anemia    H/O. no current issues  . Anxiety   . Blood transfusion 5/11   2 units bld transfused at [facility]  . BRCA negative 2015  . Depression   . History of seasonal allergies   . Kidney stone    passsed stone - no surgery required  . Melanoma ([place]) 2003   on back - used local to removed  . Polycystic ovary syndrome   . PONV (postoperative nausea and vomiting)   . Wears contact lenses     Past Surgical History:  Procedure Laterality Date  . ABDOMINAL HYSTERECTOMY  2015  . CESAREAN SECTION  05/2009   at 39 wks at [facility],  lived only 12 days  . CESAREAN SECTION  02/02/2011   Procedure: CESAREAN SECTION;  Surgeon: Donnamae Jude, MD;  Location: [facility];  Service: Gynecology;  Laterality: N/A;  Repeart c/section  . COLONOSCOPY WITH PROPOFOL N/A 12/20/2014   Procedure: COLONOSCOPY WITH PROPOFOL;  Surgeon: Lucilla Lame, MD;  Location: [facility];  Service: Endoscopy;  Laterality: N/A;  . DIAGNOSTIC LAPAROSCOPY     ovarian cyst removed  . LAPAROSCOPIC BILATERAL SALPINGECTOMY Bilateral 07/20/2012   Procedure: LAPAROSCOPIC BILATERAL SALPINGECTOMY;  Surgeon: Osborne Oman, MD;  Location: [facility];  Service: Gynecology;  Laterality: Bilateral;  .  LAPAROSCOPIC HYSTERECTOMY Left 04/18/2013   Procedure: HYSTERECTOMY TOTAL LAPAROSCOPIC and  left salpingectomy;  Surgeon: Donnamae Jude, MD;  Location: [facility];  Service: Gynecology;  Laterality: Left;  . laporascopy    . NOVASURE ABLATION N/A 07/20/2012   Procedure: NOVASURE ABLATION;  Surgeon: Osborne Oman, MD;  Location: [facility];  Service: Gynecology;  Laterality: N/A;  . SCAR REVISION  02/02/2011   Procedure: SCAR REVISION;  Surgeon: Donnamae Jude, MD;  Location: [facility];  Service: Gynecology;  Laterality: N/A;  . SVD  04/2006   x 1 - [facility]  . TONSILLECTOMY    . TONSILLECTOMY AND ADENOIDECTOMY    . WISDOM TOOTH EXTRACTION     2 lower extracted    Family History  Problem Relation Age of Onset  . Cancer Mother 63    Ovarian  . Parkinson's disease Mother   . Mental illness Mother   . Depression Mother   . Cancer Maternal Grandmother     Ovarian  . Parkinson's disease Maternal Grandmother   . Diabetes Maternal Grandfather   . Cancer Father 49    prostate  . Cancer Paternal Aunt 51    Breast  . Anesthesia problems Neg Hx   . Hypotension Neg Hx   . Malignant hyperthermia Neg Hx   . Pseudochol deficiency Neg Hx     Social History  Social History  Substance Use Topics  . Smoking status: Never Smoker  . Smokeless tobacco: Never Used  . Alcohol use Yes     Comment: occasionally    Allergies  Allergen Reactions  . Atomoxetine Other (See Comments)  . Strattera [Atomoxetine Hcl] Nausea And Vomiting and Other (See Comments)    insomnia    Current Outpatient Prescriptions  Medication Sig Dispense Refill  . cetirizine (ZYRTEC) 10 MG tablet Take 10 mg by mouth daily. Reported on 06/18/2015    . MULTIPLE VITAMIN PO Take 1 tablet by mouth daily.    . phentermine (ADIPEX-P) 37.5 MG tablet Take 1 tablet (37.5 mg total) by mouth daily before breakfast. 30 tablet 3  . polyvinyl alcohol (LIQUIFILM TEARS) 1.4 % ophthalmic solution Place 1 drop into both eyes as  needed for dry eyes. Reported on 06/18/2015     No current facility-administered medications for this visit.     Review of Systems Review of Systems  Constitutional: Negative.   Respiratory: Negative.   Cardiovascular: Negative.     Blood pressure 116/70, pulse 72, resp. rate 12, height 5' 8" (1.727 m), weight 172 lb (78 kg), last menstrual period 03/23/2013.  Physical Exam Physical Exam  Constitutional: She is oriented to person, place, and time. She appears well-developed and well-nourished.  HENT:  Mouth/Throat: Oropharynx is clear and moist.  Eyes: Conjunctivae are normal. No scleral icterus.  Neck: Neck supple.  Cardiovascular: Normal rate, regular rhythm and normal heart sounds.   Pulmonary/Chest: Effort normal and breath sounds normal.  Abdominal: Soft.  Genitourinary: Rectum normal.     Genitourinary Comments: 6 mm anterior skin tag  Lymphadenopathy:    She has no cervical adenopathy.       Right: No inguinal adenopathy present.       Left: No inguinal adenopathy present.  Neurological: She is alert and oriented to person, place, and time.  Skin: Skin is warm and dry.  Psychiatric: Her behavior is normal.    Data Reviewed PCP notes of 04/19/2016. Colonoscopy dated 12/20/2014 reviewed. Assessment    Redundant perianal skin tag.    Plan    The patient reports significant discomfort, far out of proportion to the physical findings. While the skin taken be removed, the risks of delayed healing, infection and the rare risk of difficulties with defecation makes this of questionable benefit. Hydrocortisone cream to the area three times a day for 2 weeks and call or send a MyChart message with progress. Recommend adding fiber supplement of choice to daily regimen. The patient is aware to call back for any questions or concerns.   HPI, Physical Exam, Assessment and Plan have been scribed under the direction and in the presence of Robert Bellow, MD.  Karie Fetch, RN  I have completed the exam and reviewed the above documentation for accuracy and completeness.  I agree with the above.  Haematologist has been used and any errors in dictation or transcription are unintentional.  Hervey Ard, M.D., F.A.C.S.    Robert Bellow 04/29/2016, 6:41 PM

## 2016-04-28 NOTE — Patient Instructions (Addendum)
The patient is aware to call back for any questions or concerns. Follow up as needed or if symptoms worsen. Hydrocortisone cream to the area three times a day for 2 weeks and call or send a MyChart message with progress. Recommend adding fiber supplement of choice to daily regimen.

## 2016-04-29 DIAGNOSIS — K644 Residual hemorrhoidal skin tags: Secondary | ICD-10-CM | POA: Insufficient documentation

## 2016-05-12 ENCOUNTER — Encounter: Payer: Self-pay | Admitting: General Surgery

## 2016-05-17 ENCOUNTER — Encounter: Payer: Self-pay | Admitting: Physician Assistant

## 2016-05-17 DIAGNOSIS — T781XXA Other adverse food reactions, not elsewhere classified, initial encounter: Secondary | ICD-10-CM

## 2016-05-17 DIAGNOSIS — E039 Hypothyroidism, unspecified: Secondary | ICD-10-CM

## 2016-05-25 ENCOUNTER — Telehealth: Payer: Self-pay | Admitting: Physician Assistant

## 2016-05-28 ENCOUNTER — Telehealth: Payer: Self-pay | Admitting: Physician Assistant

## 2016-05-28 NOTE — Telephone Encounter (Signed)
Pt advised, lab slip is up front-aa

## 2016-05-28 NOTE — Telephone Encounter (Signed)
Pt is looking for a lab slip that was suppose to be done about 2 weeks ago for thyroid test.  She is off today and was told she could come by and pick up a slip today.  I checked but there is not a slip ready.  Pt is aware Sonia Baller is not here today.  Her call back number is [phone]  Thank sTeri

## 2016-05-30 LAB — ALLERGENS(10) FOODS
ALLERGEN CORN, IGE: 2.16 kU/L — AB
Egg, Whole IgE: <0.1 kU/L
F045-IGE YEAST: 1.1 kU/L — AB
F081-IgE Cheese, Cheddar Type: <0.1 kU/L
F082-IgE Cheese, Mold Type: <0.1 kU/L
Malt: 1.04 kU/L — AB
Milk IgE: 0.13 kU/L — AB
Peanut IgE: 6.88 kU/L — AB
SOYBEAN IGE: 1.4 kU/L — AB
WHEAT IGE: 2.34 kU/L — AB

## 2016-05-30 LAB — THYROID PANEL WITH TSH
Free Thyroxine Index: 2.1 (ref 1.2–4.9)
T3 Uptake Ratio: 23 % — ABNORMAL LOW (ref 24–39)
T4 TOTAL: 9.1 ug/dL (ref 4.5–12.0)
TSH: 1.75 u[IU]/mL (ref 0.450–4.500)

## 2016-06-01 ENCOUNTER — Telehealth: Payer: Self-pay

## 2016-06-01 NOTE — Telephone Encounter (Signed)
Patient was advised. KW 

## 2016-06-01 NOTE — Telephone Encounter (Signed)
[id number] 

## 2016-06-01 NOTE — Telephone Encounter (Signed)
-----   Message from Mar Daring, [place] sent at 06/01/2016  2:17 PM EDT ----- Allergen panel does show low sensitivities to many foods. Would recommend referral to allergist if patient would like further evaluation. None of the results showed high allergies, just equivocal to low responses.

## 2016-06-03 ENCOUNTER — Ambulatory Visit (INDEPENDENT_AMBULATORY_CARE_PROVIDER_SITE_OTHER): Payer: Managed Care, Other (non HMO) | Admitting: Physician Assistant

## 2016-06-03 VITALS — BP 100/62 | HR 96 | Temp 98.4°F | Resp 18 | Wt 171.0 lb

## 2016-06-03 DIAGNOSIS — Z91018 Allergy to other foods: Secondary | ICD-10-CM | POA: Diagnosis not present

## 2016-06-03 DIAGNOSIS — Z124 Encounter for screening for malignant neoplasm of cervix: Secondary | ICD-10-CM | POA: Diagnosis not present

## 2016-06-03 DIAGNOSIS — N760 Acute vaginitis: Secondary | ICD-10-CM

## 2016-06-03 DIAGNOSIS — N898 Other specified noninflammatory disorders of vagina: Secondary | ICD-10-CM | POA: Diagnosis not present

## 2016-06-03 DIAGNOSIS — B9689 Other specified bacterial agents as the cause of diseases classified elsewhere: Secondary | ICD-10-CM

## 2016-06-03 LAB — POCT WET PREP (WET MOUNT): Trichomonas Wet Prep HPF POC: ABSENT

## 2016-06-03 MED ORDER — METRONIDAZOLE 500 MG PO TABS: 500.0000 mg | ORAL_TABLET | Freq: Two times a day (BID) | ORAL | 0 refills | Status: AC

## 2016-06-03 NOTE — Patient Instructions (Signed)

## 2016-06-03 NOTE — Progress Notes (Signed)
Debra Hernandez  MRN: [id number] DOB: 11-03-1985  Subjective:  HPI  Patient is here for issues with vaginal itching and burning on the external area. Not much of discharge. No abnormal odor. No bleeding. No rash or blisters. This has been going on for 4 to 5 days. Last sexual intercourse with her husband was on Sunday May 27th. They do not use condoms. She has tried OTC yeast plus tablets with no relief. She did not try Monistat due to developing severe swelling in the vaginal area when she did before. No fever, no nausea. She would like to be tested for STIs. She is also due for a PAP smear. Has had a hysterectomy, still has cervix. Last PAP in 05/11/2012 was normal. No urinary symptoms.  Patient Active Problem List   Diagnosis Date Noted  . Anal skin tag 04/29/2016  . BMI 27.0-27.9,adult 05/21/2015  . Hematochezia   . Change in bowel habits   . Absolute anemia 04/26/2014  . Anxiety 04/26/2014  . Clinical depression 04/26/2014  . H/O renal calculi 04/26/2014  . Malignant melanoma ([place]) 04/26/2014  . Allergic rhinitis, seasonal 04/26/2014  . Significan Family History of Ovarian Cancer 05/19/2012  . Chronic Pelvic Pain 05/19/2012  . Polycystic ovary syndrome   . Abnormal weight gain 02/02/2007    Past Medical History:  Diagnosis Date  . Anemia    H/O. no current issues  . Anxiety   . Blood transfusion 5/11   2 units bld transfused at [facility]  . BRCA negative 2015  . Depression   . History of seasonal allergies   . Kidney stone    passsed stone - no surgery required  . Melanoma ([place]) 2003   on back - used local to removed  . Polycystic ovary syndrome   . PONV (postoperative nausea and vomiting)   . Wears contact lenses     Social History   Social History  . Marital status: Married    Spouse name: N/A  . Number of children: N/A  . Years of education: N/A   Occupational History  . Not on file.   Social History Main Topics  . Smoking status: Never  Smoker  . Smokeless tobacco: Never Used  . Alcohol use Yes     Comment: occasionally  . Drug use: No  . Sexual activity: Yes    Partners: Male    Birth control/ protection: None     Comment: pregnant   Other Topics Concern  . Not on file   Social History Narrative  . No narrative on file    Outpatient Encounter Prescriptions as of 06/03/2016  Medication Sig Note  . cetirizine (ZYRTEC) 10 MG tablet Take 10 mg by mouth daily. Reported on 06/18/2015   . MULTIPLE VITAMIN PO Take 1 tablet by mouth daily. 04/26/2014: Received from: [facility]:   . phentermine (ADIPEX-P) 37.5 MG tablet Take 1 tablet (37.5 mg total) by mouth daily before breakfast.   . polyvinyl alcohol (LIQUIFILM TEARS) 1.4 % ophthalmic solution Place 1 drop into both eyes as needed for dry eyes. Reported on 06/18/2015   . valACYclovir (VALTREX) 1000 MG tablet  06/03/2016: prn   No facility-administered encounter medications on file as of 06/03/2016.     Allergies  Allergen Reactions  . Atomoxetine Other (See Comments)  . Monistat [Miconazole] Swelling    Swelling shut in the vaginal area per patient  . Strattera [Atomoxetine Hcl] Nausea And Vomiting and Other (See Comments)  insomnia    Review of Systems  Constitutional: Negative.   Respiratory: Negative.   Cardiovascular: Negative.   Gastrointestinal: Negative.   Genitourinary:       Itching, burning in the vaginal area.  Musculoskeletal: Negative.     Objective:  BP 100/62   Pulse 96   Temp 98.4 F (36.9 C)   Resp 18   Wt 171 lb (77.6 kg)   LMP 03/23/2013 Comment: DUB  BMI 26.00 kg/m   Physical Exam  Constitutional: She is oriented to person, place, and time and well-developed, well-nourished, and in no distress. No distress.  Genitourinary: Vulva normal. Rectal exam shows no external hemorrhoid. Vulva exhibits no erythema, no exudate, no lesion, no rash and no tenderness. Vagina exhibits normal mucosa, no exudate, no  lesion and no rugosity. Thick  creamy and vaginal discharge found.  Neurological: She is alert and oriented to person, place, and time. Gait normal.  Skin: Skin is warm and dry. She is not diaphoretic.  Psychiatric: Affect and judgment normal.    Assessment and Plan :  1. Vaginal discharge  Wet Prep with clue cells, indicating bacterial vaginosis. No hyphae visualized. Treat as below.  - POCT Wet Prep ([place]et [place]) - GC/Chlamydia Probe Amp  2. Cervical cancer screening  - Pap IG w/ reflex to HPV when ASC-U  3. Bacterial vaginosis  - metroNIDAZOLE (FLAGYL) 500 MG tablet; Take 1 tablet (500 mg total) by mouth 2 (two) times daily.  Dispense: 14 tablet; Refill: 0  4. Multiple food allergies  - Ambulatory referral to Allergy  Return if symptoms worsen or fail to improve.  The entirety of the information documented in the History of Present Illness, Review of Systems and Physical Exam were personally obtained by me. Portions of this information were initially documented by [facility]  and reviewed by me for thoroughness and accuracy.

## 2016-06-05 LAB — GC/CHLAMYDIA PROBE AMP
Chlamydia trachomatis, NAA: NEGATIVE
Neisseria gonorrhoeae by PCR: NEGATIVE

## 2016-06-05 LAB — PLEASE NOTE

## 2016-06-06 LAB — PAP IG W/ RFLX HPV ASCU: PAP Smear Comment: 0

## 2016-06-11 ENCOUNTER — Telehealth: Payer: Self-pay

## 2016-06-11 DIAGNOSIS — B3731 Acute candidiasis of vulva and vagina: Secondary | ICD-10-CM

## 2016-06-11 DIAGNOSIS — B373 Candidiasis of vulva and vagina: Secondary | ICD-10-CM

## 2016-06-11 NOTE — Telephone Encounter (Signed)
LMTCB 06/11/2016   Thanks,   -Mickel Baas

## 2016-06-11 NOTE — Telephone Encounter (Signed)
-----   Message from Trinna Post, [place] sent at 06/07/2016  1:32 PM EDT ----- Her PAP smear came back negative. There was yeast identified on PAP smear. I only saw BV on the wet mount, but sometimes the PAP can pick up what a wet mount might miss.  You can have BV and yeast at the same time. If she is still symptomatic by the time of return, we can send her in some cream, likely Nystatin as she had a reaction to Monistat at one point. Thank you.

## 2016-06-14 NOTE — Telephone Encounter (Signed)
Pt advised.  She reports she would like to try the Nystatin cream.  She says her symptoms are not completely resolved.   Thanks,   -Mickel Baas

## 2016-06-14 NOTE — Telephone Encounter (Signed)
Pt is returning call.  RJ#[phone]/[id number]

## 2016-06-15 NOTE — Telephone Encounter (Signed)
I called the pharmacy and apparently they do not stock vaginal pills/suppositories like I thought. I think she had a localized reaction to Monistat vs. Systemic and would probably be OK with oral fluconazole/diflucan. So I can send this in, one pill today and then one three days later if symptoms persist. Which pharmacy does she prefer?

## 2016-06-16 MED ORDER — FLUCONAZOLE 150 MG PO TABS: ORAL_TABLET | ORAL | 0 refills | Status: DC

## 2016-06-16 NOTE — Telephone Encounter (Signed)
Pt advised.  Please send Fluconazole to [facility].   Thanks,   -Mickel Baas

## 2016-06-16 NOTE — Telephone Encounter (Signed)
Sent in [facility] [address]. One pill on day one. May take second pill three days later if symptoms persist.

## 2016-06-18 ENCOUNTER — Encounter: Payer: Self-pay | Admitting: General Surgery

## 2016-07-07 ENCOUNTER — Other Ambulatory Visit: Payer: Self-pay | Admitting: Physician Assistant

## 2016-07-07 DIAGNOSIS — B001 Herpesviral vesicular dermatitis: Secondary | ICD-10-CM

## 2016-07-20 ENCOUNTER — Ambulatory Visit: Payer: Managed Care, Other (non HMO) | Admitting: Physician Assistant

## 2016-08-06 ENCOUNTER — Ambulatory Visit: Payer: Managed Care, Other (non HMO) | Admitting: Physician Assistant

## 2016-08-11 ENCOUNTER — Encounter: Payer: Self-pay | Admitting: General Surgery

## 2016-08-24 ENCOUNTER — Telehealth: Payer: Self-pay | Admitting: Physician Assistant

## 2016-08-24 ENCOUNTER — Telehealth: Payer: Self-pay

## 2016-08-24 ENCOUNTER — Encounter: Payer: Self-pay | Admitting: Physician Assistant

## 2016-08-24 ENCOUNTER — Ambulatory Visit (INDEPENDENT_AMBULATORY_CARE_PROVIDER_SITE_OTHER): Payer: Managed Care, Other (non HMO) | Admitting: Physician Assistant

## 2016-08-24 ENCOUNTER — Ambulatory Visit
Admission: RE | Admit: 2016-08-24 | Discharge: 2016-08-24 | Disposition: A | Discharge status: Home / Self Care | Payer: Managed Care, Other (non HMO) | Source: Ambulatory Visit | Attending: Physician Assistant | Admitting: Physician Assistant

## 2016-08-24 VITALS — BP 122/76 | HR 84 | Temp 98.4°F | Resp 16 | Wt 171.0 lb

## 2016-08-24 DIAGNOSIS — M546 Pain in thoracic spine: Secondary | ICD-10-CM

## 2016-08-24 DIAGNOSIS — R1011 Right upper quadrant pain: Secondary | ICD-10-CM | POA: Diagnosis present

## 2016-08-24 DIAGNOSIS — R110 Nausea: Secondary | ICD-10-CM | POA: Diagnosis not present

## 2016-08-24 LAB — POCT URINALYSIS DIPSTICK
Bilirubin, UA: NEGATIVE
Blood, UA: NEGATIVE
Glucose, UA: NEGATIVE
Ketones, UA: NEGATIVE
Leukocytes, UA: NEGATIVE
Nitrite, UA: NEGATIVE
Protein, UA: NEGATIVE
Spec Grav, UA: 1.025 (ref 1.010–1.025)
Urobilinogen, UA: 1 E.U./dL
pH, UA: 6 (ref 5.0–8.0)

## 2016-08-24 LAB — CBC WITH DIFFERENTIAL/PLATELET
Basophils Absolute: 0 10*3/uL (ref 0.0–0.2)
Basos: 0 %
EOS (ABSOLUTE): 0.1 10*3/uL (ref 0.0–0.4)
Eos: 2 %
Hematocrit: 36.9 % (ref 34.0–46.6)
Hemoglobin: 12.3 g/dL (ref 11.1–15.9)
Immature Grans (Abs): 0 10*3/uL (ref 0.0–0.1)
Immature Granulocytes: 0 %
Lymphocytes Absolute: 2 10*3/uL (ref 0.7–3.1)
Lymphs: 35 %
MCH: 32.4 pg (ref 26.6–33.0)
MCHC: 33.3 g/dL (ref 31.5–35.7)
MCV: 97 fL (ref 79–97)
Monocytes Absolute: 0.5 10*3/uL (ref 0.1–0.9)
Monocytes: 8 %
Neutrophils Absolute: 3.1 10*3/uL (ref 1.4–7.0)
Neutrophils: 55 %
Platelets: 191 10*3/uL (ref 150–379)
RBC: 3.8 x10E6/uL (ref 3.77–5.28)
RDW: 12.7 % (ref 12.3–15.4)
WBC: 5.7 10*3/uL (ref 3.4–10.8)

## 2016-08-24 LAB — COMPREHENSIVE METABOLIC PANEL
ALT: 7 IU/L (ref 0–32)
AST: 12 IU/L (ref 0–40)
Albumin/Globulin Ratio: 1.7 (ref 1.2–2.2)
Albumin: 4.3 g/dL (ref 3.5–5.5)
Alkaline Phosphatase: 57 IU/L (ref 39–117)
BUN/Creatinine Ratio: 18 (ref 9–23)
BUN: 12 mg/dL (ref 6–20)
Bilirubin Total: 0.3 mg/dL (ref 0.0–1.2)
CO2: 23 mmol/L (ref 20–29)
Calcium: 9.2 mg/dL (ref 8.7–10.2)
Chloride: 103 mmol/L (ref 96–106)
Creatinine, Ser: 0.68 mg/dL (ref 0.57–1.00)
GFR calc Af Amer: 137 mL/min/{1.73_m2} (ref >59)
GFR calc non Af Amer: 119 mL/min/{1.73_m2} (ref >59)
Globulin, Total: 2.5 g/dL (ref 1.5–4.5)
Glucose: 80 mg/dL (ref 65–99)
Potassium: 4.2 mmol/L (ref 3.5–5.2)
Sodium: 141 mmol/L (ref 134–144)
Total Protein: 6.8 g/dL (ref 6.0–8.5)

## 2016-08-24 LAB — LIPASE: Lipase: 13 U/L — ABNORMAL LOW (ref 14–72)

## 2016-08-24 LAB — AMYLASE: Amylase: 35 U/L (ref 31–124)

## 2016-08-24 MED ORDER — ONDANSETRON HCL 4 MG PO TABS: 4.0000 mg | ORAL_TABLET | Freq: Three times a day (TID) | ORAL | 0 refills | Status: DC | PRN

## 2016-08-24 NOTE — Patient Instructions (Signed)
Cholelithiasis Cholelithiasis is also called "gallstones." It is a kind of gallbladder disease. The gallbladder is an organ that stores a liquid (bile) that helps you digest fat. Gallstones may not cause symptoms (may be silent gallstones) until they cause a blockage, and then they can cause pain (gallbladder attack). Follow these instructions at home:  Take over-the-counter and prescription medicines only as told by your doctor.  Stay at a healthy weight.  Eat healthy foods. This includes: ? Eating fewer fatty foods, like fried foods. ? Eating fewer refined carbs (refined carbohydrates). Refined carbs are breads and grains that are highly processed, like white bread and white rice. Instead, choose whole grains like whole-wheat bread and brown rice. ? Eating more fiber. Almonds, fresh fruit, and beans are healthy sources of fiber.  Keep all follow-up visits as told by your doctor. This is important. Contact a doctor if:  You have sudden pain in the upper right side of your belly (abdomen). Pain might spread to your right shoulder or your chest. This may be a sign of a gallbladder attack.  You feel sick to your stomach (are nauseous).  You throw up (vomit).  You have been diagnosed with gallstones that have no symptoms and you get: ? Belly pain. ? Discomfort, burning, or fullness in the upper part of your belly (indigestion). Get help right away if:  You have sudden pain in the upper right side of your belly, and it lasts for more than 2 hours.  You have belly pain that lasts for more than 5 hours.  You have a fever or chills.  You keep feeling sick to your stomach or you keep throwing up.  Your skin or the whites of your eyes turn yellow (jaundice).  You have dark-colored pee (urine).  You have light-colored poop (stool). Summary  Cholelithiasis is also called "gallstones."  The gallbladder is an organ that stores a liquid (bile) that helps you digest fat.  Silent  gallstones are gallstones that do not cause symptoms.  A gallbladder attack may cause sudden pain in the upper right side of your belly. Pain might spread to your right shoulder or your chest. If this happens, contact your doctor.  If you have sudden pain in the upper right side of your belly that lasts for more than 2 hours, get help right away. This information is not intended to replace advice given to you by your health care provider. Make sure you discuss any questions you have with your health care provider. Document Released: 06/09/2007 Document Revised: 09/07/2015 Document Reviewed: 09/07/2015 Elsevier Interactive Patient Education  2017 [employer].  

## 2016-08-24 NOTE — Telephone Encounter (Signed)
Did patient get stat labs? She needs them. Ultrasound normal.

## 2016-08-24 NOTE — Telephone Encounter (Signed)
[facility] called with call report. Ultrasound of abdomen normal. Patient has left to go home and was told that we would call her with further instruction.

## 2016-08-24 NOTE — Progress Notes (Signed)
Patient: Debra Hernandez Female    DOB: March 15, 1985   31 y.o.   MRN: [id number] Visit Date: 08/24/2016  Today's Provider: Trinna Post, PA-C   Chief Complaint  Patient presents with  . Back Pain    Started suddenly Sunday   Subjective:      Debra Hernandez is a 31 y/o G3P3  woman with history of kidney stone at age 67 presenting today with back pain and RUQ pain since Sunday. It has made her nauseated, but she has not vomited. It wraps around her right ribs and into the breast area. Does not radiate into groin. No urinary symptoms. Has some bloating at baseline. She has had no appetite, symptoms worsen with eating. Does not use alcohol. She has had no fever, chills. Pain relief ineffective at urgent care. Has hard script for hydrocodone. Pain not worsened by breathing. Traveled to [place] a few months ago.   Pt reports sudden back pain in Sunday mid day.  She states her back pain in on the right side below her shoulder pain.  It has radiated to the right side under her her breast. She went to [facility] in [place] yesterday, there she was giving a Toradol shot and Hydocodone. The Doctor at [place] advised her to follow up with her Primary today secondary to possible Gall Stones.        Back Pain  This is a new problem. The current episode started in the past 7 days. The problem occurs constantly. The problem is unchanged. The pain is present in the thoracic spine (Right side). Radiates to: Radiates to the right side under her ribs. The pain is at a severity of 7/10. The pain is severe. The symptoms are aggravated by position. Stiffness is present all day.       Allergies  Allergen Reactions  . Atomoxetine Other (See Comments)  . Monistat [Miconazole] Swelling    Swelling shut in the vaginal area per patient  . Strattera [Atomoxetine Hcl] Nausea And Vomiting and Other (See Comments)    insomnia     Current Outpatient Prescriptions:  .  cetirizine (ZYRTEC) 10 MG  tablet, Take 10 mg by mouth daily. Reported on 06/18/2015, Disp: , Rfl:  .  montelukast (SINGULAIR) 10 MG tablet, Take 10 mg by mouth at bedtime., Disp: , Rfl:  .  MULTIPLE VITAMIN PO, Take 1 tablet by mouth daily., Disp: , Rfl:  .  polyvinyl alcohol (LIQUIFILM TEARS) 1.4 % ophthalmic solution, Place 1 drop into both eyes as needed for dry eyes. Reported on 06/18/2015, Disp: , Rfl:  .  valACYclovir (VALTREX) 1000 MG tablet, TAKE 2 TABLETS BY MOUTH  TWICE DAILY FOR ONE DAY AT  ONSET OF COLD SORE; MAY  REPEAT AS NECESSARY, Disp: 60 tablet, Rfl: 1 .  ondansetron (ZOFRAN) 4 MG tablet, Take 1 tablet (4 mg total) by mouth every 8 (eight) hours as needed for nausea or vomiting., Disp: 20 tablet, Rfl: 0  Review of Systems  Constitutional: Positive for fatigue. Negative for activity change, appetite change, chills and unexpected weight change.  Cardiovascular: Negative for leg swelling.  Gastrointestinal: Negative for abdominal distention, anal bleeding, blood in stool, constipation, diarrhea and rectal pain.  Neurological: Positive for light-headedness.    Social History  Substance Use Topics  . Smoking status: Never Smoker  . Smokeless tobacco: Never Used  . Alcohol use Yes     Comment: occasionally   Objective:   BP 122/76 (  BP Location: Left Arm, Patient Position: Sitting, Cuff Size: Normal)   Pulse 84   Temp 98.4 F (36.9 C) (Oral)   Resp 16   Wt 171 lb (77.6 kg)   LMP 03/23/2013 Comment: DUB  BMI 26.00 kg/m  Vitals:   08/24/16 0945  BP: 122/76  Pulse: 84  Resp: 16  Temp: 98.4 F (36.9 C)  TempSrc: Oral  Weight: 171 lb (77.6 kg)     Physical Exam  Constitutional: She is oriented to person, place, and time. She appears well-developed and well-nourished.  Cardiovascular: Normal rate and regular rhythm.   Pulmonary/Chest: Effort normal and breath sounds normal.  Abdominal: Soft. Bowel sounds are normal. She exhibits no distension. There is no hepatosplenomegaly. There is  tenderness in the right upper quadrant. There is positive Murphy's sign. There is no rigidity, no rebound, no guarding and no tenderness at McBurney's point.  Neurological: She is alert and oriented to person, place, and time.  Skin: Skin is warm and dry.  Psychiatric: She has a normal mood and affect. Her behavior is normal.        Assessment & Plan:     1. RUQ pain Cholelithiasis vs. Cholecystitis vs. Hepatitis vs. Possible kidney stone. Evaluate as below. Urine clear, leaning more towards GI pathology. If US/Labs negative, will pursue CT renal stone study. Counseled on return precautions.  - Comprehensive Metabolic Panel (CMET) - CBC with Differential - Lipase - Amylase - US Abdomen Limited RUQ; Future  2. Nausea  - Comprehensive Metabolic Panel (CMET) - CBC with Differential - Lipase - Amylase - US Abdomen Limited RUQ; Future - ondansetron (ZOFRAN) 4 MG tablet; Take 1 tablet (4 mg total) by mouth every 8 (eight) hours as needed for nausea or vomiting.  Dispense: 20 tablet; Refill: 0  3. Acute right-sided thoracic back pain  - POCT urinalysis dipstick  Return if symptoms worsen or fail to improve.  The entirety of the information documented in the History of Present Illness, Review of Systems and Physical Exam were personally obtained by me. Portions of this information were initially documented by Ashley Royalty, CMA and reviewed by me for thoroughness and accuracy.        Trinna Post, PA-C  [facility]

## 2016-08-25 ENCOUNTER — Other Ambulatory Visit: Payer: Self-pay | Admitting: Physician Assistant

## 2016-08-25 DIAGNOSIS — M546 Pain in thoracic spine: Secondary | ICD-10-CM

## 2016-08-25 DIAGNOSIS — M791 Myalgia, unspecified site: Secondary | ICD-10-CM

## 2016-08-25 MED ORDER — CYCLOBENZAPRINE HCL 5 MG PO TABS: 5.0000 mg | ORAL_TABLET | Freq: Every day | ORAL | 0 refills | Status: DC

## 2016-08-25 MED ORDER — MELOXICAM 7.5 MG PO TABS: 7.5000 mg | ORAL_TABLET | Freq: Two times a day (BID) | ORAL | 0 refills | Status: DC

## 2016-08-25 NOTE — Progress Notes (Signed)
Spoke with patient about pain. Pain has changed from abdominal area to more rib area, worse with deep breaths. Improved from when it first started, but still present. No urinary symptoms still. Talked about CT for renal stones vs. CXR. Think CXR would be more helpful due to evolving nature and location of pain. Patient would like to try some Meloxicam and flexeril due to history of muscle spasms, and then get CXR if not improving. Counseled patient to call us if getting worse or with any questions. [facility] [address]. Just FYI, I have already spoken with patient.

## 2016-08-31 ENCOUNTER — Telehealth: Payer: Self-pay | Admitting: Physician Assistant

## 2016-08-31 NOTE — Telephone Encounter (Signed)
Glad to hear she is feeling better. No need to get the CXR if she is having improvement.

## 2016-08-31 NOTE — Telephone Encounter (Signed)
Pt states she is feeling better and pt states she is no longer taking the medication.  Pt is still have some pain but it is bearable.  Pt is wanting to hold off on doing any x-rays.  YI#[phone]/LM

## 2016-09-01 ENCOUNTER — Encounter: Payer: Self-pay | Admitting: General Surgery

## 2016-09-01 ENCOUNTER — Ambulatory Visit (INDEPENDENT_AMBULATORY_CARE_PROVIDER_SITE_OTHER): Payer: Managed Care, Other (non HMO) | Admitting: General Surgery

## 2016-09-01 VITALS — BP 88/58 | Resp 12 | Ht 68.0 in | Wt 177.0 lb

## 2016-09-01 DIAGNOSIS — K602 Anal fissure, unspecified: Secondary | ICD-10-CM

## 2016-09-01 DIAGNOSIS — K644 Residual hemorrhoidal skin tags: Secondary | ICD-10-CM | POA: Diagnosis not present

## 2016-09-01 NOTE — Progress Notes (Signed)
Patient ID: Debra Hernandez, female   DOB: Jul 02, 1985, 31 y.o.   MRN: [id number]  Chief Complaint  Patient presents with  . Follow-up    skin tag    HPI Debra Hernandez is a 31 y.o. female.  Here for evaluation of a skin tag near perianal area. She is interested in having it removed. She states it has swelled twice since her last visit in April. She states it is tender for about 3 days and then gets better. She does have some pain with BM.The patient denies any sense of "razor blades". She works with [employer]. She did have allergy testing done. She is being followed by Fenton Malling. Dr Allen Norris completed colonoscopy in 2016. She states her stools changed in 2015 after her hysterectomy. She describes the stool as sticky and sometimes "frothy" maybe 1-2 times a month. She does not feel like she empties with her BM. She states she has bloating symptoms. She has adjusted her diet as well. She has tried an enema, she cramped. She tried the fiber wafers without an appreciable change in stool texture.[name]  HPI  Past Medical History:  Diagnosis Date  . Anemia    H/O. no current issues  . Anxiety   . Blood transfusion 5/11   2 units bld transfused at [facility]  . BRCA negative 2015  . Depression   . History of seasonal allergies   . Kidney stone    passsed stone - no surgery required  . Melanoma ([place]) 2003   on back - used local to removed  . Polycystic ovary syndrome   . PONV (postoperative nausea and vomiting)   . Wears contact lenses     Past Surgical History:  Procedure Laterality Date  . ABDOMINAL HYSTERECTOMY  2015  . CESAREAN SECTION  05/2009   at 39 wks at [facility],  lived only 12 days  . CESAREAN SECTION  02/02/2011   Procedure: CESAREAN SECTION;  Surgeon: Donnamae Jude, MD;  Location: [facility];  Service: Gynecology;  Laterality: N/A;  Repeart c/section  . COLONOSCOPY WITH PROPOFOL N/A 12/20/2014   Procedure: COLONOSCOPY WITH PROPOFOL;  Surgeon: Lucilla Lame, MD;   Location: [facility];  Service: Endoscopy;  Laterality: N/A;  . DIAGNOSTIC LAPAROSCOPY     ovarian cyst removed  . LAPAROSCOPIC BILATERAL SALPINGECTOMY Bilateral 07/20/2012   Procedure: LAPAROSCOPIC BILATERAL SALPINGECTOMY;  Surgeon: Osborne Oman, MD;  Location: [facility];  Service: Gynecology;  Laterality: Bilateral;  . LAPAROSCOPIC HYSTERECTOMY Left 04/18/2013   Procedure: HYSTERECTOMY TOTAL LAPAROSCOPIC and  left salpingectomy;  Surgeon: Donnamae Jude, MD;  Location: [facility];  Service: Gynecology;  Laterality: Left;  . laporascopy    . NOVASURE ABLATION N/A 07/20/2012   Procedure: NOVASURE ABLATION;  Surgeon: Osborne Oman, MD;  Location: [facility];  Service: Gynecology;  Laterality: N/A;  . SCAR REVISION  02/02/2011   Procedure: SCAR REVISION;  Surgeon: Donnamae Jude, MD;  Location: [facility];  Service: Gynecology;  Laterality: N/A;  . SVD  04/2006   x 1 - [facility]  . TONSILLECTOMY    . TONSILLECTOMY AND ADENOIDECTOMY    . WISDOM TOOTH EXTRACTION     2 lower extracted    Family History  Problem Relation Age of Onset  . Cancer Mother 73       Ovarian  . Parkinson's disease Mother   . Mental illness Mother   . Depression Mother   . Cancer Maternal Grandmother  Ovarian  . Parkinson's disease Maternal Grandmother   . Diabetes Maternal Grandfather   . Cancer Father 81       prostate  . Cancer Paternal Aunt 79       Breast  . Anesthesia problems Neg Hx   . Hypotension Neg Hx   . Malignant hyperthermia Neg Hx   . Pseudochol deficiency Neg Hx     Social History Social History  Substance Use Topics  . Smoking status: Never Smoker  . Smokeless tobacco: Never Used  . Alcohol use Yes     Comment: occasionally    Allergies  Allergen Reactions  . Atomoxetine Other (See Comments)  . Monistat [Miconazole] Swelling    Swelling shut in the vaginal area per patient  . Strattera [Atomoxetine Hcl] Nausea And Vomiting and Other (See Comments)     insomnia    Current Outpatient Prescriptions  Medication Sig Dispense Refill  . cetirizine (ZYRTEC) 10 MG tablet Take 10 mg by mouth daily. Reported on 06/18/2015    . cyclobenzaprine (FLEXERIL) 5 MG tablet Take 1 tablet (5 mg total) by mouth at bedtime. 30 tablet 0  . meloxicam (MOBIC) 7.5 MG tablet Take 1 tablet (7.5 mg total) by mouth 2 (two) times daily. 30 tablet 0  . montelukast (SINGULAIR) 10 MG tablet Take 10 mg by mouth at bedtime.    . MULTIPLE VITAMIN PO Take 1 tablet by mouth daily.    . ondansetron (ZOFRAN) 4 MG tablet Take 1 tablet (4 mg total) by mouth every 8 (eight) hours as needed for nausea or vomiting. 20 tablet 0  . polyvinyl alcohol (LIQUIFILM TEARS) 1.4 % ophthalmic solution Place 1 drop into both eyes as needed for dry eyes. Reported on 06/18/2015    . valACYclovir (VALTREX) 1000 MG tablet TAKE 2 TABLETS BY MOUTH  TWICE DAILY FOR ONE DAY AT  ONSET OF COLD SORE; MAY  REPEAT AS NECESSARY 60 tablet 1   No current facility-administered medications for this visit.     Review of Systems Review of Systems  Constitutional: Negative.   Respiratory: Negative.   Cardiovascular: Negative.   Neurological: Positive for headaches.    Blood pressure (!) 88/58, resp. rate 12, height _0  (1.727 m), weight 177 lb (80.3 kg), last menstrual period 03/23/2013.  Physical Exam Physical Exam  Constitutional: She is oriented to person, place, and time. She appears well-developed and well-nourished.  HENT:  Mouth/Throat: Oropharynx is clear and moist.  Eyes: Conjunctivae are normal. No scleral icterus.  Neck: Neck supple.  Cardiovascular: Normal rate, regular rhythm and normal heart sounds.   Pulmonary/Chest: Effort normal and breath sounds normal.  Abdominal: Soft. Normal appearance and bowel sounds are normal. There is no tenderness.  Genitourinary: Rectal exam shows fissure.     Genitourinary Comments: Small anal skin tag present  Lymphadenopathy:    She has no cervical  adenopathy.  Neurological: She is alert and oriented to person, place, and time.  Skin: Skin is warm and dry.  Psychiatric: Her behavior is normal.  Anoscopy confirmed the anterior fissure. No abnormality of the lower rectal mucosa. No significant internal hemorrhoids.  Data Reviewed 2016 colonoscopy.  Assessment    Altered elimination post hysterectomy.  Small anterior anal fissure, minimally symptomatic.  Very small area of redundant skin anterior to the anus.    Plan    The patient emphasized that she is very self-conscious about the small area of skin, and is embarrassed about its presence, reports that she is afraid  to have herself husband see her/this area. I explained that certainly this area could be removed, but slightly now related to the fissure and this is likely related to her abnormal defecation pattern. While she's had a normal colonoscopy, her symptoms may be related to the anatomic changes of the rectosigmoid associated with hysterectomy or possibly some other motility issue not evident on colonoscopy. Her present exam certainly doesn't warrant lateral internal sphincterotomy. With the minimal symptoms, even Botox injections are probably more than indicated.  Our goal right now is given via try to establish a more routine elimination pattern. If this is not successful, we'll likely encourage her to have a second GI evaluation with a physician more in-tune to her concerns    Recommend trial of one capful of Miralax daily, may adjust powder as needed based on stool consistency. She may need second opinion regarding removal of the anal skin tag. She will send follow up in 2 weeks  HPI, Physical Exam, Assessment and Plan have been scribed under the direction and in the presence of Robert Bellow, MD. Karie Fetch, RN  I have completed the exam and reviewed the above documentation for accuracy and completeness.  I agree with the above.  Haematologist has been used  and any errors in dictation or transcription are unintentional.  Hervey Ard, M.D., F.A.C.S.  Robert Bellow 09/02/2016, 7:11 AM

## 2016-09-01 NOTE — Telephone Encounter (Signed)
Pt advised.   Thanks,   -Jahquez Steffler  

## 2016-09-01 NOTE — Patient Instructions (Addendum)
Recommend trial of one capful of Miralax daily, may adjust powder as needed based on stool consistency   Anal Fissure, Adult An anal fissure is a small tear or crack in the skin around the opening of the butt (anus).Bleeding from the tear or crack usually stops on its own within a few minutes. The bleeding may happen every time you poop (have a bowel movement) until the tear or crack heals. Follow these instructions at home: Eating and drinking  Avoid bananas and dairy products. These foods can make it hard to poop.  Drink enough fluid to keep your pee (urine) clear or pale yellow.  Eat a lot of fruit, whole grains, and vegetables. General instructions  Keep the butt area as clean and dry as you can.  Take a warm water bath (sitz bath) as told by your doctor. Do not use soap.  Take over-the-counter and prescription medicines only as told by your doctor.  Use creams or ointments only as told by your doctor.  Keep all follow-up visits as told by your doctor. This is important. Contact a doctor if:  You have more bleeding.  You have a fever.  You have watery poop (diarrhea) that is mixed with blood.  You have pain.  You problem gets worse, not better. This information is not intended to replace advice given to you by your health care provider. Make sure you discuss any questions you have with your health care provider. Document Released: 08/19/2010 Document Revised: 05/29/2015 Document Reviewed: 03/18/2014 Elsevier Interactive Patient Education  [employer].

## 2016-09-02 DIAGNOSIS — K602 Anal fissure, unspecified: Secondary | ICD-10-CM | POA: Insufficient documentation

## 2016-10-22 ENCOUNTER — Other Ambulatory Visit: Payer: Self-pay | Admitting: Physician Assistant

## 2016-10-22 DIAGNOSIS — B001 Herpesviral vesicular dermatitis: Secondary | ICD-10-CM

## 2016-10-22 MED ORDER — VALACYCLOVIR HCL 1 G PO TABS: ORAL_TABLET | ORAL | 1 refills | Status: DC

## 2016-10-22 NOTE — Telephone Encounter (Signed)
Pt contacted office for refill request on the following medications:  valACYclovir (VALTREX) 1000 MG tablet  [facility]  CB#[phone]/MW

## 2016-12-31 ENCOUNTER — Encounter: Payer: Self-pay | Admitting: Physician Assistant

## 2016-12-31 DIAGNOSIS — B001 Herpesviral vesicular dermatitis: Secondary | ICD-10-CM

## 2016-12-31 MED ORDER — VALACYCLOVIR HCL 1 G PO TABS: ORAL_TABLET | ORAL | 1 refills | Status: DC

## 2017-01-26 NOTE — Telephone Encounter (Signed)
Error

## 2017-02-01 ENCOUNTER — Ambulatory Visit: Payer: Managed Care, Other (non HMO) | Admitting: Physician Assistant

## 2017-02-01 ENCOUNTER — Encounter: Payer: Self-pay | Admitting: Physician Assistant

## 2017-02-01 VITALS — BP 106/70 | HR 80 | Temp 98.1°F | Resp 16 | Ht 68.0 in | Wt 176.0 lb

## 2017-02-01 DIAGNOSIS — Z6826 Body mass index (BMI) 26.0-26.9, adult: Secondary | ICD-10-CM

## 2017-02-01 DIAGNOSIS — Z713 Dietary counseling and surveillance: Secondary | ICD-10-CM | POA: Diagnosis not present

## 2017-02-01 DIAGNOSIS — E663 Overweight: Secondary | ICD-10-CM

## 2017-02-01 MED ORDER — PHENTERMINE HCL 37.5 MG PO TABS: 37.5000 mg | ORAL_TABLET | Freq: Every day | ORAL | 0 refills | Status: DC

## 2017-02-01 NOTE — Progress Notes (Signed)
Patient: Debra Hernandez Female    DOB: [birth year]   32 y.o.   MRN: [id number] Visit Date: 02/01/2017  Today's Provider: Mar Daring, PA-C   Chief Complaint  Patient presents with  . Follow-up   Subjective:    HPI Patient here today requesting to restart Phentermine or talk about other options for weight loss. Patient reports she has been off medication since July of last year. Patient has gained her weight back. Patient reports limiting portion control since August and decreased carb intake.  Patient reports working out at [employer] for about an hour daily during lunch.   Allergies  Allergen Reactions  . Atomoxetine Other (See Comments)  . Monistat [Miconazole] Swelling    Swelling shut in the vaginal area per patient  . Strattera [Atomoxetine Hcl] Nausea And Vomiting and Other (See Comments)    insomnia     Current Outpatient Medications:  .  cetirizine (ZYRTEC) 10 MG tablet, Take 10 mg by mouth daily. Reported on 06/18/2015, Disp: , Rfl:  .  montelukast (SINGULAIR) 10 MG tablet, Take 10 mg by mouth at bedtime., Disp: , Rfl:  .  MULTIPLE VITAMIN PO, Take 1 tablet by mouth daily., Disp: , Rfl:  .  polyvinyl alcohol (LIQUIFILM TEARS) 1.4 % ophthalmic solution, Place 1 drop into both eyes as needed for dry eyes. Reported on 06/18/2015, Disp: , Rfl:  .  valACYclovir (VALTREX) 1000 MG tablet, TAKE 2 TABLETS BY MOUTH  TWICE DAILY FOR ONE DAY AT  ONSET OF COLD SORE; MAY  REPEAT AS NECESSARY, Disp: 60 tablet, Rfl: 1  Review of Systems  Constitutional: Negative.   HENT: Negative.   Respiratory: Negative.   Cardiovascular: Negative.   Gastrointestinal: Negative.   Neurological: Negative.     Social History   Tobacco Use  . Smoking status: Never Smoker  . Smokeless tobacco: Never Used  Substance Use Topics  . Alcohol use: Yes    Comment: occasionally   Objective:   BP 106/70 (BP Location: Left Arm, Patient Position: Sitting, Cuff Size: Normal)   Pulse  80   Temp 98.1 F (36.7 C) (Oral)   Resp 16   Ht 5\' 8"  (1.727 m)   Wt 176 lb (79.8 kg)   LMP 03/23/2013 Comment: DUB  SpO2 99%   BMI 26.76 kg/m  Vitals:   02/01/17 0808  BP: 106/70  Pulse: 80  Resp: 16  Temp: 98.1 F (36.7 C)  TempSrc: Oral  SpO2: 99%  Weight: 176 lb (79.8 kg)  Height: 5\' 8"  (1.727 m)     Physical Exam  Constitutional: She appears well-developed and well-nourished. No distress.  Neck: Normal range of motion. Neck supple.  Cardiovascular: Normal rate, regular rhythm and normal heart sounds. Exam reveals no gallop and no friction rub.  No murmur heard. Pulmonary/Chest: Effort normal and breath sounds normal. No respiratory distress. She has no wheezes. She has no rales.  Skin: She is not diaphoretic.  Vitals reviewed.       Assessment & Plan:     1. Overweight Will restart phentermine as below. Continue exercise. Discussed food diary and limiting intake to 1400 calories per day. I will see her back in 1 month. If not losing weight may consider trying to get Saxenda covered for her since she does have PCOS as this may help the insulin resistance component there.  - phentermine (ADIPEX-P) 37.5 MG tablet; Take 1 tablet (37.5 mg total) by mouth daily before breakfast.  Dispense: 30 tablet; Refill: 0  2. BMI 26.0-26.9,adult See above medical treatment plan. - phentermine (ADIPEX-P) 37.5 MG tablet; Take 1 tablet (37.5 mg total) by mouth daily before breakfast.  Dispense: 30 tablet; Refill: 0  3. Encounter for weight loss counseling See above medical treatment plan. - phentermine (ADIPEX-P) 37.5 MG tablet; Take 1 tablet (37.5 mg total) by mouth daily before breakfast.  Dispense: 30 tablet; Refill: 0       Mar Daring, PA-C  [facility]

## 2017-03-01 ENCOUNTER — Ambulatory Visit: Payer: Managed Care, Other (non HMO) | Admitting: Physician Assistant

## 2017-03-01 ENCOUNTER — Encounter: Payer: Self-pay | Admitting: Physician Assistant

## 2017-03-01 DIAGNOSIS — Z713 Dietary counseling and surveillance: Secondary | ICD-10-CM

## 2017-03-01 DIAGNOSIS — Z6826 Body mass index (BMI) 26.0-26.9, adult: Secondary | ICD-10-CM | POA: Diagnosis not present

## 2017-03-01 DIAGNOSIS — E663 Overweight: Secondary | ICD-10-CM

## 2017-03-01 MED ORDER — PHENTERMINE HCL 37.5 MG PO TABS: 37.5000 mg | ORAL_TABLET | Freq: Every day | ORAL | 2 refills | Status: DC

## 2017-03-01 NOTE — Progress Notes (Signed)
Patient: Debra Hernandez Female    DOB: 01-14-1985   32 y.o.   MRN: [id number] Visit Date: 03/01/2017  Today's Provider: Mar Daring, PA-C   Chief Complaint  Patient presents with  . Follow-up    Obesity   Subjective:    HPI Patient is here for 1 month follow-up weight.she was restarted on Phentermine a month ago. She reports she goes to the gym every day during lunch for an hour. She reports she is doing portion control, limiting carb and caffeine intake.  Patient reports she had been 198 at the beginning of the new year and she feels she was 196 pounds when she was here in January, not 176 so this may have been put in error. She has been keeping her weight in a journal and reports she lost 4 pounds.  Wt Readings from Last 3 Encounters:  03/01/17 191 lb 12.8 oz (87 kg)  02/01/17 176 lb (79.8 kg)  09/01/16 177 lb (80.3 kg)    Decline Flu vaccine.     Allergies  Allergen Reactions  . Atomoxetine Other (See Comments)  . Monistat [Miconazole] Swelling    Swelling shut in the vaginal area per patient  . Strattera [Atomoxetine Hcl] Nausea And Vomiting and Other (See Comments)    insomnia     Current Outpatient Medications:  .  cetirizine (ZYRTEC) 10 MG tablet, Take 10 mg by mouth daily. Reported on 06/18/2015, Disp: , Rfl:  .  montelukast (SINGULAIR) 10 MG tablet, Take 10 mg by mouth at bedtime., Disp: , Rfl:  .  MULTIPLE VITAMIN PO, Take 1 tablet by mouth daily., Disp: , Rfl:  .  phentermine (ADIPEX-P) 37.5 MG tablet, Take 1 tablet (37.5 mg total) by mouth daily before breakfast., Disp: 30 tablet, Rfl: 0 .  polyvinyl alcohol (LIQUIFILM TEARS) 1.4 % ophthalmic solution, Place 1 drop into both eyes as needed for dry eyes. Reported on 06/18/2015, Disp: , Rfl:  .  valACYclovir (VALTREX) 1000 MG tablet, TAKE 2 TABLETS BY MOUTH  TWICE DAILY FOR ONE DAY AT  ONSET OF COLD SORE; MAY  REPEAT AS NECESSARY, Disp: 60 tablet, Rfl: 1  Review of Systems  Cardiovascular:  Negative for chest pain, palpitations and leg swelling.    Social History   Tobacco Use  . Smoking status: Never Smoker  . Smokeless tobacco: Never Used  Substance Use Topics  . Alcohol use: Yes    Comment: occasionally   Objective:   BP 110/70 (BP Location: Left Arm, Patient Position: Sitting, Cuff Size: Normal)   Pulse 77   Temp 98.1 F (36.7 C) (Oral)   Resp 16   Wt 191 lb 12.8 oz (87 kg)   LMP 03/23/2013 Comment: DUB  BMI 29.16 kg/m  Vitals:   03/01/17 0811  BP: 110/70  Pulse: 77  Resp: 16  Temp: 98.1 F (36.7 C)  TempSrc: Oral  Weight: 191 lb 12.8 oz (87 kg)     Physical Exam  Constitutional: She appears well-developed and well-nourished. No distress.  Neck: Normal range of motion. Neck supple. No tracheal deviation present. No thyromegaly present.  Cardiovascular: Normal rate, regular rhythm and normal heart sounds. Exam reveals no gallop and no friction rub.  No murmur heard. Pulmonary/Chest: Effort normal and breath sounds normal. No respiratory distress. She has no wheezes. She has no rales.  Lymphadenopathy:    She has no cervical adenopathy.  Skin: She is not diaphoretic.  Psychiatric: She has a normal mood  and affect. Her behavior is normal. Judgment and thought content normal.  Vitals reviewed.       Assessment & Plan:     1. Overweight Counseled patient on healthy lifestyle modifications including dieting and exercise. Advised to start a food diary and stay between 1000-1400 calories. I will see her back in 3 months for weight recheck.  - phentermine (ADIPEX-P) 37.5 MG tablet; Take 1 tablet (37.5 mg total) by mouth daily before breakfast.  Dispense: 30 tablet; Refill: 2  2. BMI 26.0-26.9,adult See above medical treatment plan. - phentermine (ADIPEX-P) 37.5 MG tablet; Take 1 tablet (37.5 mg total) by mouth daily before breakfast.  Dispense: 30 tablet; Refill: 2  3. Encounter for weight loss counseling See above medical treatment plan. -  phentermine (ADIPEX-P) 37.5 MG tablet; Take 1 tablet (37.5 mg total) by mouth daily before breakfast.  Dispense: 30 tablet; Refill: [place], PA-C  [facility]

## 2017-05-06 ENCOUNTER — Other Ambulatory Visit: Payer: Self-pay | Admitting: Physician Assistant

## 2017-05-06 DIAGNOSIS — B001 Herpesviral vesicular dermatitis: Secondary | ICD-10-CM

## 2017-06-10 ENCOUNTER — Other Ambulatory Visit: Payer: Self-pay | Admitting: Physician Assistant

## 2017-06-10 ENCOUNTER — Ambulatory Visit: Payer: Managed Care, Other (non HMO) | Admitting: Physician Assistant

## 2017-06-10 ENCOUNTER — Encounter: Payer: Self-pay | Admitting: Physician Assistant

## 2017-06-10 VITALS — BP 110/70 | HR 74 | Temp 97.9°F | Resp 16 | Wt 185.8 lb

## 2017-06-10 DIAGNOSIS — E663 Overweight: Secondary | ICD-10-CM

## 2017-06-10 DIAGNOSIS — Z6828 Body mass index (BMI) 28.0-28.9, adult: Secondary | ICD-10-CM

## 2017-06-10 DIAGNOSIS — E282 Polycystic ovarian syndrome: Secondary | ICD-10-CM

## 2017-06-10 MED ORDER — LIRAGLUTIDE -WEIGHT MANAGEMENT 18 MG/3ML ~~LOC~~ SOPN: 0.6000 mL | PEN_INJECTOR | Freq: Every day | SUBCUTANEOUS | 5 refills | Status: DC

## 2017-06-10 NOTE — Patient Instructions (Signed)
Liraglutide injection (Weight Management) What is this medicine? LIRAGLUTIDE (LIR a GLOO tide) is used with a reduced calorie diet and exercise to help you lose weight. This medicine may be used for other purposes; ask your health care provider or pharmacist if you have questions. COMMON BRAND NAME(S): Saxenda What should I tell my health care provider before I take this medicine? They need to know if you have any of these conditions: -endocrine tumors (MEN 2) or if someone in your family had these tumors -gallbladder disease -high cholesterol -history of alcohol abuse problem -history of pancreatitis -kidney disease or if you are on dialysis -liver disease -previous swelling of the tongue, face, or lips with difficulty breathing, difficulty swallowing, hoarseness, or tightening of the throat -stomach problems -suicidal thoughts, plans, or attempt; a previous suicide attempt by you or a family member -thyroid cancer or if someone in your family had thyroid cancer -an unusual or allergic reaction to liraglutide, other medicines, foods, dyes, or preservatives -pregnant or trying to get pregnant -breast-feeding How should I use this medicine? This medicine is for injection under the skin of your upper leg, stomach area, or upper arm. You will be taught how to prepare and give this medicine. Use exactly as directed. Take your medicine at regular intervals. Do not take it more often than directed. It is important that you put your used needles and syringes in a special sharps container. Do not put them in a trash can. If you do not have a sharps container, call your pharmacist or healthcare provider to get one. A special MedGuide will be given to you by the pharmacist with each prescription and refill. Be sure to read this information carefully each time. Talk to your pediatrician regarding the use of this medicine in children. Special care may be needed. Overdosage: If you think you have taken  too much of this medicine contact a poison control center or emergency room at once. NOTE: This medicine is only for you. Do not share this medicine with others. What if I miss a dose? If you miss a dose, take it as soon as you can. If it is almost time for your next dose, take only that dose. Do not take double or extra doses. If you miss your dose for 3 days or more, call your doctor or health care professional to talk about how to restart this medicine. What may interact with this medicine? -insulin and other medicines for diabetes This list may not describe all possible interactions. Give your health care provider a list of all the medicines, herbs, non-prescription drugs, or dietary supplements you use. Also tell them if you smoke, drink alcohol, or use illegal drugs. Some items may interact with your medicine. What should I watch for while using this medicine? Visit your doctor or health care professional for regular checks on your progress. This medicine is intended to be used in addition to a healthy diet and appropriate exercise. The best results are achieved this way. Do not increase or in any way change your dose without consulting your doctor or health care professional. Drink plenty of fluids while taking this medicine. Check with your doctor or health care professional if you get an attack of severe diarrhea, nausea, and vomiting. The loss of too much body fluid can make it dangerous for you to take this medicine. This medicine may affect blood sugar levels. If you have diabetes, check with your doctor or health care professional before you change your diet  or the dose of your diabetic medicine. Patients and their families should watch out for worsening depression or thoughts of suicide. Also watch out for sudden changes in feelings such as feeling anxious, agitated, panicky, irritable, hostile, aggressive, impulsive, severely restless, overly excited and hyperactive, or not being able to  sleep. If this happens, especially at the beginning of treatment or after a change in dose, call your health care professional. What side effects may I notice from receiving this medicine? Side effects that you should report to your doctor or health care professional as soon as possible: -allergic reactions like skin rash, itching or hives, swelling of the face, lips, or tongue -breathing problems -diarrhea that continues or is severe -lump or swelling on the neck -severe nausea -signs and symptoms of infection like fever or chills; cough; sore throat; pain or trouble passing urine -signs and symptoms of low blood sugar such as feeling anxious, confusion, dizziness, increased hunger, unusually weak or tired, sweating, shakiness, cold, irritable, headache, blurred vision, fast heartbeat, loss of consciousness -signs and symptoms of kidney injury like trouble passing urine or change in the amount of urine -trouble swallowing -unusual stomach upset or pain -vomiting Side effects that usually do not require medical attention (report to your doctor or health care professional if they continue or are bothersome): -constipation -decreased appetite -diarrhea -fatigue -headache -nausea -pain, redness, or irritation at site where injected -stomach upset -stuffy or runny nose This list may not describe all possible side effects. Call your doctor for medical advice about side effects. You may report side effects to FDA at 1-800-FDA-1088. Where should I keep my medicine? Keep out of the reach of children. Store unopened pen in a refrigerator between 2 and 8 degrees C (36 and 46 degrees F). Do not freeze or use if the medicine has been frozen. Protect from light and excessive heat. After you first use the pen, it can be stored at room temperature between 15 and 30 degrees C (59 and 86 degrees F) or in a refrigerator. Throw away your used pen after 30 days or after the expiration date, whichever comes  first. Do not store your pen with the needle attached. If the needle is left on, medicine may leak from the pen. NOTE: This sheet is a summary. It may not cover all possible information. If you have questions about this medicine, talk to your doctor, pharmacist, or health care provider.  2018 Elsevier/Gold Standard (2016-01-08 14:41:37)  

## 2017-06-10 NOTE — Progress Notes (Signed)
Patient: Debra Hernandez Female    DOB: 07/13/1985   32 y.o.   MRN: [id number] Visit Date: 06/10/2017  Today's Provider: Mar Daring, PA-C   Chief Complaint  Patient presents with  . Follow-up   Subjective:    HPI Patient is here to follow-up on Weight loss counseling. She reports she has 2 more days of the Phentermine. She is exercising and eating healthy. She reports noticing more food sensitivities as well but is avoiding trigger foods. Previous weight: 191pounds, current weight 185 pounds.   She does also have PCOS and insulin resistance associated with PCOS.      Allergies  Allergen Reactions  . Atomoxetine Other (See Comments)  . Monistat [Miconazole] Swelling    Swelling shut in the vaginal area per patient  . Strattera [Atomoxetine Hcl] Nausea And Vomiting and Other (See Comments)    insomnia     Current Outpatient Medications:  .  montelukast (SINGULAIR) 10 MG tablet, Take 10 mg by mouth at bedtime., Disp: , Rfl:  .  MULTIPLE VITAMIN PO, Take 1 tablet by mouth daily., Disp: , Rfl:  .  phentermine (ADIPEX-P) 37.5 MG tablet, Take 1 tablet (37.5 mg total) by mouth daily before breakfast., Disp: 30 tablet, Rfl: 2 .  polyvinyl alcohol (LIQUIFILM TEARS) 1.4 % ophthalmic solution, Place 1 drop into both eyes as needed for dry eyes. Reported on 06/18/2015, Disp: , Rfl:  .  valACYclovir (VALTREX) 1000 MG tablet, TAKE 2 TABLETS BY MOUTH TWICE A DAY FOR 1 DAY AT ONSET OF COLD SORE. MAY REPEAT AS NECESSARY, Disp: 120 tablet, Rfl: 1  Review of Systems  Social History   Tobacco Use  . Smoking status: Never Smoker  . Smokeless tobacco: Never Used  Substance Use Topics  . Alcohol use: Yes    Comment: occasionally   Objective:   BP 110/70 (BP Location: Left Arm, Patient Position: Sitting, Cuff Size: Normal)   Pulse 74   Temp 97.9 F (36.6 C) (Oral)   Resp 16   Wt 185 lb 12.8 oz (84.3 kg)   LMP 03/23/2013 Comment: DUB  BMI 28.25 kg/m  Vitals:   06/10/17 1623  BP: 110/70  Pulse: 74  Resp: 16  Temp: 97.9 F (36.6 C)  TempSrc: Oral  Weight: 185 lb 12.8 oz (84.3 kg)     Physical Exam  Constitutional: She appears well-developed and well-nourished. No distress.  Neck: Normal range of motion. Neck supple.  Cardiovascular: Normal rate, regular rhythm and normal heart sounds. Exam reveals no gallop and no friction rub.  No murmur heard. Pulmonary/Chest: Effort normal and breath sounds normal. No respiratory distress. She has no wheezes. She has no rales.  Skin: She is not diaphoretic.  Vitals reviewed.       Assessment & Plan:     1. Overweight (BMI 25.0-29.9) Will change from phentermine to saxenda as below. May gain benefit from saxenda due to inuslin resistance and family history of diabetes. She has also tried and failed Belviq. If approved will start as below. Patient advised to call if she has questions about starting and can come in for a nurse visit on how to inject. She agrees. I will see her back in 3-4 months.  - Liraglutide -Weight Management (SAXENDA) 18 MG/3ML SOPN; Inject 0.6 mLs into the skin daily. Increase by 0.39mL weekly as tolerated until max dose of 34mL is reached  Dispense: 3 pen; Refill: 5  2. BMI 28.0-28.9,adult See above medical treatment  plan. - Liraglutide -Weight Management (SAXENDA) 18 MG/3ML SOPN; Inject 0.6 mLs into the skin daily. Increase by 0.67mL weekly as tolerated until max dose of 70mL is reached  Dispense: 3 pen; Refill: 5  3. Polycystic ovary syndrome See above medical treatment plan. - Liraglutide -Weight Management (SAXENDA) 18 MG/3ML SOPN; Inject 0.6 mLs into the skin daily. Increase by 0.58mL weekly as tolerated until max dose of 72mL is reached  Dispense: 3 pen; Refill: [place], PA-C  [facility]

## 2017-06-17 ENCOUNTER — Encounter: Payer: Self-pay | Admitting: Physician Assistant

## 2017-06-21 ENCOUNTER — Telehealth: Payer: Self-pay

## 2017-06-21 NOTE — Telephone Encounter (Signed)
Debra Hernandez form Cover My Meds called asking about the status of the PA for Saxenda. Reference # is [id number]. His CB# is [phone].

## 2017-06-22 NOTE — Telephone Encounter (Signed)
Pt states the prior auth that was doen yesterday was denied.  Pt states she would like for [place] to reach out [facility] directly at [phone].

## 2017-06-23 NOTE — Telephone Encounter (Signed)
Appeal form was faxed this morning to [facility].

## 2017-07-04 ENCOUNTER — Encounter: Payer: Self-pay | Admitting: Physician Assistant

## 2017-09-13 ENCOUNTER — Encounter: Payer: Self-pay | Admitting: Physician Assistant

## 2017-09-26 ENCOUNTER — Encounter: Payer: Self-pay | Admitting: Physician Assistant

## 2017-09-26 DIAGNOSIS — Z713 Dietary counseling and surveillance: Secondary | ICD-10-CM

## 2017-09-28 MED ORDER — NALTREXONE-BUPROPION HCL ER 8-90 MG PO TB12: ORAL_TABLET | ORAL | 0 refills | Status: DC

## 2017-09-28 NOTE — Addendum Note (Signed)
Addended by: Mar Daring on: 09/28/2017 12:57 PM   Modules accepted: Orders

## 2017-09-30 ENCOUNTER — Telehealth: Payer: Self-pay

## 2017-09-30 NOTE — Telephone Encounter (Signed)
Lm that her medication for Contrave was approved.  Thanks.

## 2018-02-01 NOTE — Progress Notes (Signed)
Patient: Debra Hernandez Female    DOB: 1985/12/28   33 y.o.   MRN: [id number] Visit Date: 02/03/2018  Today's Provider: Mar Daring, PA-C   Chief Complaint  Patient presents with  . Weight Loss   Subjective:     HPI  Overweight (BMI 25.0-29.9) From 06/10/2017-Will change from phentermine to saxenda. Patient states she would like to like to be put back on to Phentermine. Patient states in 11/30/2017 she had a appetite increase due to not been on the Saxenda.    Allergies  Allergen Reactions  . Atomoxetine Other (See Comments)  . Monistat [Miconazole] Swelling    Swelling shut in the vaginal area per patient  . Strattera [Atomoxetine Hcl] Nausea And Vomiting and Other (See Comments)    insomnia     Current Outpatient Medications:  .  montelukast (SINGULAIR) 10 MG tablet, Take 10 mg by mouth at bedtime., Disp: , Rfl:  .  MULTIPLE VITAMIN PO, Take 1 tablet by mouth daily., Disp: , Rfl:  .  Naltrexone-buPROPion HCl ER 8-90 MG TB12, Take 1 tab PO daily x 1 week, then 1 tab PO BID x 1 week, then 2 tab PO q am and 1 tab PO q pm x 1 week, then 2 tab PO BID, Disp: 360 tablet, Rfl: 0 .  polyvinyl alcohol (LIQUIFILM TEARS) 1.4 % ophthalmic solution, Place 1 drop into both eyes as needed for dry eyes. Reported on 06/18/2015, Disp: , Rfl:  .  valACYclovir (VALTREX) 1000 MG tablet, TAKE 2 TABLETS BY MOUTH TWICE A DAY FOR 1 DAY AT ONSET OF COLD SORE. MAY REPEAT AS NECESSARY, Disp: 120 tablet, Rfl: 1  Review of Systems  Constitutional: Positive for appetite change and unexpected weight change. Negative for chills, fatigue and fever.  Respiratory: Negative for chest tightness and shortness of breath.   Cardiovascular: Negative for chest pain and palpitations.  Gastrointestinal: Negative for abdominal pain, nausea and vomiting.  Neurological: Negative for dizziness and weakness.    Social History   Tobacco Use  . Smoking status: Never Smoker  . Smokeless tobacco: Never  Used  Substance Use Topics  . Alcohol use: Yes    Comment: occasionally      Objective:   BP 124/78 (BP Location: Left Arm, Patient Position: Sitting, Cuff Size: Normal)   Pulse 82   Temp 98.4 F (36.9 C) (Oral)   Wt 186 lb 8 oz (84.6 kg)   LMP 03/23/2013 Comment: DUB  SpO2 98%   BMI 28.36 kg/m  Vitals:   02/03/18 0959  BP: 124/78  Pulse: 82  Temp: 98.4 F (36.9 C)  TempSrc: Oral  SpO2: 98%  Weight: 186 lb 8 oz (84.6 kg)     Physical Exam Vitals signs reviewed.  Constitutional:      General: She is not in acute distress.    Appearance: She is well-developed. She is not diaphoretic.  Neck:     Musculoskeletal: Normal range of motion and neck supple.  Cardiovascular:     Rate and Rhythm: Normal rate and regular rhythm.     Heart sounds: Normal heart sounds. No murmur. No friction rub. No gallop.   Pulmonary:     Effort: Pulmonary effort is normal. No respiratory distress.     Breath sounds: Normal breath sounds. No wheezing or rales.         Assessment & Plan    1. Encounter for weight loss counseling Restart phentermine as below. Stop Contrave. May  consider adding Contrave in between phentermine for maintenance. Continue exercise. Try to be more consistent with food diary. Limit to 1200-1400 calories daily. Recheck weight in 3 months.  - phentermine (ADIPEX-P) 37.5 MG tablet; Take 1 tablet (37.5 mg total) by mouth daily before breakfast.  Dispense: 30 tablet; Refill: 0  2. BMI 28.0-28.9,adult See above medical treatment plan. - phentermine (ADIPEX-P) 37.5 MG tablet; Take 1 tablet (37.5 mg total) by mouth daily before breakfast.  Dispense: 30 tablet; Refill: 0     Mar Daring, PA-C  [facility]

## 2018-02-03 ENCOUNTER — Ambulatory Visit: Payer: Managed Care, Other (non HMO) | Admitting: Physician Assistant

## 2018-02-03 ENCOUNTER — Encounter: Payer: Self-pay | Admitting: Physician Assistant

## 2018-02-03 VITALS — BP 124/78 | HR 82 | Temp 98.4°F | Wt 186.5 lb

## 2018-02-03 DIAGNOSIS — Z6828 Body mass index (BMI) 28.0-28.9, adult: Secondary | ICD-10-CM | POA: Diagnosis not present

## 2018-02-03 DIAGNOSIS — Z713 Dietary counseling and surveillance: Secondary | ICD-10-CM

## 2018-02-03 MED ORDER — PHENTERMINE HCL 37.5 MG PO TABS: 37.5000 mg | ORAL_TABLET | Freq: Every day | ORAL | 2 refills | Status: DC

## 2018-02-03 MED ORDER — PHENTERMINE HCL 37.5 MG PO TABS: 37.5000 mg | ORAL_TABLET | Freq: Every day | ORAL | 0 refills | Status: DC

## 2018-03-04 ENCOUNTER — Other Ambulatory Visit: Payer: Self-pay | Admitting: Physician Assistant

## 2018-03-04 DIAGNOSIS — Z713 Dietary counseling and surveillance: Secondary | ICD-10-CM

## 2018-03-04 DIAGNOSIS — Z6828 Body mass index (BMI) 28.0-28.9, adult: Secondary | ICD-10-CM

## 2018-03-06 ENCOUNTER — Other Ambulatory Visit: Payer: Self-pay | Admitting: Physician Assistant

## 2018-03-06 ENCOUNTER — Encounter: Payer: Self-pay | Admitting: Physician Assistant

## 2018-03-06 DIAGNOSIS — Z713 Dietary counseling and surveillance: Secondary | ICD-10-CM

## 2018-03-06 DIAGNOSIS — Z6828 Body mass index (BMI) 28.0-28.9, adult: Secondary | ICD-10-CM

## 2018-03-06 NOTE — Telephone Encounter (Signed)
See mychart messages

## 2018-03-06 NOTE — Telephone Encounter (Signed)
Pt calling about paper work that [facility] is faxing for her Rx -  phentermine (ADIPEX-P) 37.5 MG tablet.  Please let her know if it was received.  Thanks, [employer]

## 2018-04-07 ENCOUNTER — Other Ambulatory Visit: Payer: Self-pay | Admitting: Physician Assistant

## 2018-04-07 DIAGNOSIS — B001 Herpesviral vesicular dermatitis: Secondary | ICD-10-CM

## 2018-04-07 NOTE — Telephone Encounter (Signed)
Please Review

## 2018-05-03 ENCOUNTER — Encounter: Payer: Self-pay | Admitting: Physician Assistant

## 2018-05-03 DIAGNOSIS — Z713 Dietary counseling and surveillance: Secondary | ICD-10-CM

## 2018-05-03 MED ORDER — NALTREXONE-BUPROPION HCL ER 8-90 MG PO TB12: ORAL_TABLET | ORAL | 3 refills | Status: DC

## 2018-05-18 ENCOUNTER — Encounter: Payer: Self-pay | Admitting: Physician Assistant

## 2018-05-26 ENCOUNTER — Encounter: Payer: Self-pay | Admitting: Physician Assistant

## 2018-06-02 ENCOUNTER — Encounter: Payer: Self-pay | Admitting: Physician Assistant

## 2018-06-08 NOTE — Telephone Encounter (Signed)
[facility] called saying they were going to be faxing a questionnaire regarding the patients weight loss.\  Please complete and send back when you receive it  Thanks teri

## 2018-11-01 ENCOUNTER — Encounter: Payer: Self-pay | Admitting: Physician Assistant

## 2019-01-30 ENCOUNTER — Other Ambulatory Visit: Payer: Self-pay | Admitting: Physician Assistant

## 2019-01-30 DIAGNOSIS — Z713 Dietary counseling and surveillance: Secondary | ICD-10-CM

## 2019-01-30 NOTE — Telephone Encounter (Signed)
Requested medication (s) are due for refill today: yes  Requested medication (s) are on the active medication list: yes  Last refill:  01/03/2019  Future visit scheduled: no  Notes to clinic: no assigned protocol    Requested Prescriptions  Pending Prescriptions Disp Refills   CONTRAVE 8-90 MG TB12 [Pharmacy Med Name: CONTRAVE ER 8-90 MG TABLET] 120 tablet 2    Sig: START WITH 1 TAB EVERY MORNING AND 1 TAB EVERY EVENING X 1 WEEK, THEN 2 TABS EVERY MORNING & 1 TAB EVERY EVENING X 1 WEEK, THEN INCREASE TO 2 TABS TWICE A DAY      Off-Protocol Failed - 01/30/2019  1:30 AM      Failed - Medication not assigned to a protocol, review manually.      Failed - Valid encounter within last 12 months    Recent Outpatient Visits           12 months ago Encounter for weight loss counseling   [place], [place]   1 year ago Overweight (BMI 25.0-29.9)   [facility] [place], Clearnce Sorrel, [place]   1 year ago [place], Clearnce Sorrel, [place]   1 year ago [place], Clearnce Sorrel, [place]   2 years ago RUQ pain   [facility] [place], [place]

## 2019-05-31 ENCOUNTER — Encounter: Payer: Self-pay | Admitting: Physician Assistant

## 2019-05-31 ENCOUNTER — Telehealth (INDEPENDENT_AMBULATORY_CARE_PROVIDER_SITE_OTHER): Payer: Managed Care, Other (non HMO) | Admitting: Physician Assistant

## 2019-05-31 VITALS — Ht 68.0 in | Wt 201.0 lb

## 2019-05-31 DIAGNOSIS — Z6830 Body mass index (BMI) 30.0-30.9, adult: Secondary | ICD-10-CM

## 2019-05-31 DIAGNOSIS — E6609 Other obesity due to excess calories: Secondary | ICD-10-CM

## 2019-05-31 MED ORDER — QSYMIA 3.75-23 MG PO CP24: ORAL_CAPSULE | ORAL | 0 refills | Status: DC

## 2019-05-31 NOTE — Progress Notes (Signed)
MyChart Video Visit    Virtual Visit via Video Note   This visit type was conducted due to national recommendations for restrictions regarding the COVID-19 Pandemic (e.g. social distancing) in an effort to limit this patient's exposure and mitigate transmission in our community. This patient is at least at moderate risk for complications without adequate follow up. This format is felt to be most appropriate for this patient at this time. Physical exam was limited by quality of the video and audio technology used for the visit.   Patient location: Home Provider location: Office    Patient: Debra Hernandez   DOB: Sep 30, 1985   34 y.o. Female  MRN: [id number] Visit Date: 05/31/2019  Today's healthcare provider: Trinna Post, PA-C   Chief Complaint  Patient presents with  . Weight Loss   Subjective    HPI Weight Loss Consult: Patient would like to discuss a different medication to help with weight loss. She has tried [place], Saxenda, and Phentermine. She states the Phentermine worked the best for her. Had intolerable side effects with Saxenda. Reported she lost 2 pounds   Wt Readings from Last 3 Encounters:  05/31/19 201 lb (91.2 kg)  02/03/18 186 lb 8 oz (84.6 kg)  06/10/17 185 lb 12.8 oz (84.3 kg)       Medications: Outpatient Medications Prior to Visit  Medication Sig  . levocetirizine (XYZAL) 5 MG tablet Take 5 mg by mouth every evening.  . MULTIPLE VITAMIN PO Take 1 tablet by mouth daily.  . polyvinyl alcohol (LIQUIFILM TEARS) 1.4 % ophthalmic solution Place 1 drop into both eyes as needed for dry eyes. Reported on 06/18/2015  . valACYclovir (VALTREX) 1000 MG tablet TAKE 2 TABLETS BY MOUTH TWICE A DAY FOR 1 DAY AT ONSET OF COLD SORE. MAY REPEAT AS NECESSARY  . [DISCONTINUED] montelukast (SINGULAIR) 10 MG tablet Take 10 mg by mouth at bedtime.  . CONTRAVE 8-90 MG TB12 START WITH 1 TAB EVERY MORNING AND 1 TAB EVERY EVENING X 1 WEEK, THEN 2 TABS EVERY MORNING & 1  TAB EVERY EVENING X 1 WEEK, THEN INCREASE TO 2 TABS TWICE A DAY (Patient not taking: Reported on 05/31/2019)   No facility-administered medications prior to visit.    Review of Systems  Constitutional: Negative.   Respiratory: Negative.   Cardiovascular: Negative.   Musculoskeletal: Negative.       Objective    Ht 5\' 8"  (1.727 m)   Wt 201 lb (91.2 kg)   LMP 03/23/2013 Comment: DUB  BMI 30.56 kg/m    Physical Exam Constitutional:      Appearance: Normal appearance.  Pulmonary:     Effort: Pulmonary effort is normal. No respiratory distress.  Neurological:     Mental Status: She is alert.  Psychiatric:        Mood and Affect: Mood normal.        Behavior: Behavior normal.        Assessment & Plan    1. Class 1 obesity due to excess calories without serious comorbidity with body mass index (BMI) of 30.0 to 30.9 in adult  Intolerant to [place], contrave did not produce weight loss. Phentermine has worked to bring about weight loss in the past but this has not been sustained. Suggest Qsymia for maintenance medication as below. Follow up in 6 weeks with PCP for CPE and weight follow up. Counseled on potential side effects.   - Phentermine-Topiramate (QSYMIA) 3.75-23 MG CP24; Take one pill daily for 14  days. Then take two pills daily for 14 days.  Dispense: 42 capsule; Refill: 0 - Comprehensive Metabolic Panel (CMET) - CBC with Differential - Lipid Profile - TSH+T4F+T3Free      I discussed the assessment and treatment plan with the patient. The patient was provided an opportunity to ask questions and all were answered. The patient agreed with the plan and demonstrated an understanding of the instructions.   The patient was advised to call back or seek an in-person evaluation if the symptoms worsen or if the condition fails to improve as anticipated.   ITrinna Post, PA-C, have reviewed all documentation for this visit. The documentation on 05/31/19 for the exam,  diagnosis, procedures, and orders are all accurate and complete.   Paulene Floor [facility] [phone] (phone) [phone] (fax)  [place]

## 2019-06-01 ENCOUNTER — Encounter: Payer: Self-pay | Admitting: Physician Assistant

## 2019-06-29 ENCOUNTER — Other Ambulatory Visit: Payer: Self-pay | Admitting: Physician Assistant

## 2019-06-29 DIAGNOSIS — E6609 Other obesity due to excess calories: Secondary | ICD-10-CM

## 2019-06-29 LAB — CBC WITH DIFFERENTIAL/PLATELET
Basophils Absolute: 0.1 10*3/uL (ref 0.0–0.2)
Basos: 1 %
EOS (ABSOLUTE): 0.2 10*3/uL (ref 0.0–0.4)
Eos: 3 %
Hematocrit: 39.5 % (ref 34.0–46.6)
Hemoglobin: 12.8 g/dL (ref 11.1–15.9)
Immature Grans (Abs): 0 10*3/uL (ref 0.0–0.1)
Immature Granulocytes: 0 %
Lymphocytes Absolute: 2.2 10*3/uL (ref 0.7–3.1)
Lymphs: 37 %
MCH: 31.1 pg (ref 26.6–33.0)
MCHC: 32.4 g/dL (ref 31.5–35.7)
MCV: 96 fL (ref 79–97)
Monocytes Absolute: 0.5 10*3/uL (ref 0.1–0.9)
Monocytes: 9 %
Neutrophils Absolute: 3.1 10*3/uL (ref 1.4–7.0)
Neutrophils: 50 %
Platelets: 228 10*3/uL (ref 150–450)
RBC: 4.12 x10E6/uL (ref 3.77–5.28)
RDW: 11.9 % (ref 11.7–15.4)
WBC: 6 10*3/uL (ref 3.4–10.8)

## 2019-06-29 LAB — COMPREHENSIVE METABOLIC PANEL
ALT: 9 IU/L (ref 0–32)
AST: 12 IU/L (ref 0–40)
Albumin/Globulin Ratio: 1.8 (ref 1.2–2.2)
Albumin: 4.2 g/dL (ref 3.8–4.8)
Alkaline Phosphatase: 79 IU/L (ref 48–121)
BUN/Creatinine Ratio: 13 (ref 9–23)
BUN: 9 mg/dL (ref 6–20)
Bilirubin Total: 0.4 mg/dL (ref 0.0–1.2)
CO2: 22 mmol/L (ref 20–29)
Calcium: 9.3 mg/dL (ref 8.7–10.2)
Chloride: 103 mmol/L (ref 96–106)
Creatinine, Ser: 0.71 mg/dL (ref 0.57–1.00)
GFR calc Af Amer: 130 mL/min/{1.73_m2} (ref >59)
GFR calc non Af Amer: 113 mL/min/{1.73_m2} (ref >59)
Globulin, Total: 2.3 g/dL (ref 1.5–4.5)
Glucose: 88 mg/dL (ref 65–99)
Potassium: 4 mmol/L (ref 3.5–5.2)
Sodium: 138 mmol/L (ref 134–144)
Total Protein: 6.5 g/dL (ref 6.0–8.5)

## 2019-06-29 LAB — LIPID PANEL
Chol/HDL Ratio: 3.5 ratio (ref 0.0–4.4)
Cholesterol, Total: 176 mg/dL (ref 100–199)
HDL: 50 mg/dL (ref >39)
LDL Chol Calc (NIH): 103 mg/dL — ABNORMAL HIGH (ref 0–99)
Triglycerides: 131 mg/dL (ref 0–149)
VLDL Cholesterol Cal: 23 mg/dL (ref 5–40)

## 2019-06-29 LAB — TSH+T4F+T3FREE
Free T4: 0.92 ng/dL (ref 0.82–1.77)
T3, Free: 3.2 pg/mL (ref 2.0–4.4)
TSH: 2.16 u[IU]/mL (ref 0.450–4.500)

## 2019-06-29 NOTE — Telephone Encounter (Signed)
Can you please review request below? This is a medication to help with weight loss and I thought patient have to return every 30days when on this type of medication. Patients next office visit until 07/20/19, please advise if okay to authorize this prescription before appointment. KW

## 2019-06-29 NOTE — Telephone Encounter (Signed)
Requested medication (s) are due for refill today: Yes  Requested medication (s) are on the active medication list: Yes  Last refill:  05/31/19  Future visit scheduled: Yes  Notes to clinic:  See request.    Requested Prescriptions  Pending Prescriptions Disp Refills   QSYMIA 3.75-23 MG CP24 [Pharmacy Med Name: QSYMIA 3.75-23MG  CAPSULES] 42 capsule     Sig: TAKE ONE CAPSULE BY MOUTH DAILY FOR 14 DAYS, THEN START TAKING 2 PILLS DAILY FOR 14 DAYS      Not Delegated - Neurology: Anticonvulsants - Controlled - phentermine / topiramate Failed - 06/29/2019  9:47 AM      Failed - This refill cannot be delegated      Passed - Cr in normal range and within 360 days    Creatinine  Date Value Ref Range Status  08/01/2012 0.81 0.60 - 1.30 mg/dL Final   Creatinine, Ser  Date Value Ref Range Status  06/28/2019 0.71 0.57 - 1.00 mg/dL Final          Passed - CO2 in normal range and within 360 days    CO2  Date Value Ref Range Status  06/28/2019 22 20 - 29 mmol/L Final   Co2  Date Value Ref Range Status  08/01/2012 31 21 - 32 mmol/L Final          Passed - Valid encounter within last 12 months    Recent Outpatient Visits           4 weeks ago Class 1 obesity due to excess calories without serious comorbidity with body mass index (BMI) of 30.0 to 30.9 in adult   [facility] [place], Wendee Beavers, PA-C   1 year ago Encounter for weight loss counseling   [place], [place], [place]   2 years ago Overweight (BMI 25.0-29.9)   [place], Clearnce Sorrel, [place]   2 years ago [place], Clearnce Sorrel, [place]   2 years ago [place], Clearnce Sorrel, [place]       Future Appointments             In 3 weeks Marlyn Corporal, Clearnce Sorrel, PA-C [employer], [facility]

## 2019-07-20 ENCOUNTER — Encounter: Payer: Managed Care, Other (non HMO) | Admitting: Physician Assistant

## 2019-08-27 ENCOUNTER — Encounter: Payer: Managed Care, Other (non HMO) | Admitting: Physician Assistant

## 2019-08-31 ENCOUNTER — Encounter: Payer: Managed Care, Other (non HMO) | Admitting: Physician Assistant

## 2019-09-24 ENCOUNTER — Encounter: Payer: Self-pay | Admitting: Physician Assistant

## 2019-09-24 ENCOUNTER — Other Ambulatory Visit: Payer: Self-pay

## 2019-09-24 ENCOUNTER — Ambulatory Visit (INDEPENDENT_AMBULATORY_CARE_PROVIDER_SITE_OTHER): Payer: Managed Care, Other (non HMO) | Admitting: Physician Assistant

## 2019-09-24 VITALS — BP 109/69 | HR 88 | Temp 98.9°F | Resp 16 | Ht 67.0 in | Wt 220.0 lb

## 2019-09-24 DIAGNOSIS — E6609 Other obesity due to excess calories: Secondary | ICD-10-CM | POA: Diagnosis not present

## 2019-09-24 DIAGNOSIS — Z0000 Encounter for general adult medical examination without abnormal findings: Secondary | ICD-10-CM | POA: Diagnosis not present

## 2019-09-24 DIAGNOSIS — L301 Dyshidrosis [pompholyx]: Secondary | ICD-10-CM | POA: Diagnosis not present

## 2019-09-24 DIAGNOSIS — Z85820 Personal history of malignant melanoma of skin: Secondary | ICD-10-CM

## 2019-09-24 DIAGNOSIS — Z6834 Body mass index (BMI) 34.0-34.9, adult: Secondary | ICD-10-CM

## 2019-09-24 MED ORDER — MOMETASONE FUROATE 0.1 % EX CREA: 1.0000 "application " | TOPICAL_CREAM | Freq: Every day | CUTANEOUS | 0 refills | Status: DC

## 2019-09-24 NOTE — Progress Notes (Signed)
Complete physical exam   Patient: Debra Hernandez   DOB: 10-Nov-1985   34 y.o. Female  MRN: [id number] Visit Date: 09/24/2019  Today's healthcare provider: Mar Daring, PA-C   Chief Complaint  Patient presents with  . Annual Exam  . Eczema   Subjective    Debra Hernandez is a 34 y.o. female who presents today for a complete physical exam.  She reports consuming a general diet. Home exercise routine includes walking 1 hrs per day. She generally feels well. She reports sleeping well. She does have additional problems to discuss today.   Pt also mentions that she has eczema on her right hand that has not improved.   Desires to discuss weight loss. Recently tried Qsymia but had adverse effects, headache. Saxenda caused intolerable nausea. Contrave she was only able to lose 2 pounds over a 5 month period. Phentermine works well, but she is never able to sustain weight loss when phentermine is discontinued.   Past Medical History:  Diagnosis Date  . Anemia    H/O. no current issues  . Anxiety   . Blood transfusion 5/11   2 units bld transfused at [facility]  . BRCA negative 2015  . Depression   . History of seasonal allergies   . Kidney stone    passsed stone - no surgery required  . Melanoma ([place]) 2003   on back - used local to removed  . Polycystic ovary syndrome   . PONV (postoperative nausea and vomiting)   . Wears contact lenses    Past Surgical History:  Procedure Laterality Date  . ABDOMINAL HYSTERECTOMY  2015  . CESAREAN SECTION  05/2009   at 39 wks at [facility],  lived only 12 days  . CESAREAN SECTION  02/02/2011   Procedure: CESAREAN SECTION;  Surgeon: Donnamae Jude, MD;  Location: [facility];  Service: Gynecology;  Laterality: N/A;  Repeart c/section  . COLONOSCOPY WITH PROPOFOL N/A 12/20/2014   Procedure: COLONOSCOPY WITH PROPOFOL;  Surgeon: Lucilla Lame, MD;  Location: [place];  Service: Endoscopy;  Laterality: N/A;  .  DIAGNOSTIC LAPAROSCOPY     ovarian cyst removed  . LAPAROSCOPIC BILATERAL SALPINGECTOMY Bilateral 07/20/2012   Procedure: LAPAROSCOPIC BILATERAL SALPINGECTOMY;  Surgeon: Osborne Oman, MD;  Location: [facility];  Service: Gynecology;  Laterality: Bilateral;  . LAPAROSCOPIC HYSTERECTOMY Left 04/18/2013   Procedure: HYSTERECTOMY TOTAL LAPAROSCOPIC and  left salpingectomy;  Surgeon: Donnamae Jude, MD;  Location: [facility];  Service: Gynecology;  Laterality: Left;  . laporascopy    . NOVASURE ABLATION N/A 07/20/2012   Procedure: NOVASURE ABLATION;  Surgeon: Osborne Oman, MD;  Location: [facility];  Service: Gynecology;  Laterality: N/A;  . SCAR REVISION  02/02/2011   Procedure: SCAR REVISION;  Surgeon: Donnamae Jude, MD;  Location: [facility];  Service: Gynecology;  Laterality: N/A;  . SVD  04/2006   x 1 - [facility]  . TONSILLECTOMY    . TONSILLECTOMY AND ADENOIDECTOMY    . WISDOM TOOTH EXTRACTION     2 lower extracted   Social History   Socioeconomic History  . Marital status: Married    Spouse name: Not on file  . Number of children: Not on file  . Years of education: Not on file  . Highest education level: Not on file  Occupational History  . Not on file  Tobacco Use  . Smoking status: Never Smoker  . Smokeless tobacco: Never  Used  Substance and Sexual Activity  . Alcohol use: Yes    Comment: occasionally  . Drug use: No  . Sexual activity: Yes    Partners: Male    Birth control/protection: None    Comment: pregnant  Other Topics Concern  . Not on file  Social History Narrative  . Not on file   Social Determinants of Health   Financial Resource Strain:   . Difficulty of Paying Living Expenses: Not on file  Food Insecurity:   . Worried About Charity fundraiser in the Last Year: Not on file  . Ran Out of Food in the Last Year: Not on file  Transportation Needs:   . Lack of Transportation (Medical): Not on file  . Lack of Transportation (Non-Medical): Not on  file  Physical Activity:   . Days of Exercise per Week: Not on file  . Minutes of Exercise per Session: Not on file  Stress:   . Feeling of Stress : Not on file  Social Connections:   . Frequency of Communication with Friends and Family: Not on file  . Frequency of Social Gatherings with Friends and Family: Not on file  . Attends Religious Services: Not on file  . Active Member of Clubs or Organizations: Not on file  . Attends Archivist Meetings: Not on file  . Marital Status: Not on file  Intimate Partner Violence:   . Fear of Current or Ex-Partner: Not on file  . Emotionally Abused: Not on file  . Physically Abused: Not on file  . Sexually Abused: Not on file   Family Status  Relation Name Status  . Mother  Alive  . MGM  Deceased  . MGF  Alive  . Father  (Not Specified)  . Ethlyn Daniels  (Not Specified)  . Neg Hx  (Not Specified)   Family History  Problem Relation Age of Onset  . Cancer Mother 46       Ovarian  . Parkinson's disease Mother   . Mental illness Mother   . Depression Mother   . Cancer Maternal Grandmother        Ovarian  . Parkinson's disease Maternal Grandmother   . Diabetes Maternal Grandfather   . Cancer Father 22       prostate  . Cancer Paternal Aunt 3       Breast  . Anesthesia problems Neg Hx   . Hypotension Neg Hx   . Malignant hyperthermia Neg Hx   . Pseudochol deficiency Neg Hx    Allergies  Allergen Reactions  . Atomoxetine Other (See Comments)  . Monistat [Miconazole] Swelling    Swelling shut in the vaginal area per patient  . Strattera [Atomoxetine Hcl] Nausea And Vomiting and Other (See Comments)    insomnia    Patient Care Team: Mar Daring, PA-C as PCP - General (Family Medicine) Marlyn Corporal Clearnce Sorrel, PA-C as Physician Assistant (Family Medicine) Bary Castilla Forest Gleason, MD as Consulting Physician (General Surgery)   Medications: Outpatient Medications Prior to Visit  Medication Sig  . levocetirizine (XYZAL) 5  MG tablet Take 5 mg by mouth every evening.  . MULTIPLE VITAMIN PO Take 1 tablet by mouth daily.  . polyvinyl alcohol (LIQUIFILM TEARS) 1.4 % ophthalmic solution Place 1 drop into both eyes as needed for dry eyes. Reported on 06/18/2015  . valACYclovir (VALTREX) 1000 MG tablet TAKE 2 TABLETS BY MOUTH TWICE A DAY FOR 1 DAY AT ONSET OF COLD SORE. MAY REPEAT AS NECESSARY  .  CONTRAVE 8-90 MG TB12 START WITH 1 TAB EVERY MORNING AND 1 TAB EVERY EVENING X 1 WEEK, THEN 2 TABS EVERY MORNING & 1 TAB EVERY EVENING X 1 WEEK, THEN INCREASE TO 2 TABS TWICE A DAY (Patient not taking: Reported on 05/31/2019)  . Phentermine-Topiramate (QSYMIA) 3.75-23 MG CP24 Take 2 pills daily.   No facility-administered medications prior to visit.    Review of Systems  Constitutional: Negative.   HENT: Negative.   Eyes: Negative.   Respiratory: Negative.   Cardiovascular: Negative.   Gastrointestinal: Negative.   Endocrine: Negative.   Genitourinary: Negative.   Musculoskeletal: Negative.   Skin: Positive for rash.  Allergic/Immunologic: Negative.   Neurological: Negative.   Hematological: Negative.   Psychiatric/Behavioral: Negative.     Last CBC Lab Results  Component Value Date   WBC 6.0 06/28/2019   HGB 12.8 06/28/2019   HCT 39.5 06/28/2019   MCV 96 06/28/2019   MCH 31.1 06/28/2019   RDW 11.9 06/28/2019   PLT 228 34/19/3790   Last metabolic panel Lab Results  Component Value Date   GLUCOSE 88 06/28/2019   NA 138 06/28/2019   K 4.0 06/28/2019   CL 103 06/28/2019   CO2 22 06/28/2019   BUN 9 06/28/2019   CREATININE 0.71 06/28/2019   GFRNONAA 113 06/28/2019   GFRAA 130 06/28/2019   CALCIUM 9.3 06/28/2019   PROT 6.5 06/28/2019   ALBUMIN 4.2 06/28/2019   LABGLOB 2.3 06/28/2019   AGRATIO 1.8 06/28/2019   BILITOT 0.4 06/28/2019   ALKPHOS 79 06/28/2019   AST 12 06/28/2019   ALT 9 06/28/2019   ANIONGAP 3 (L) 08/01/2012      Objective    BP 109/69   Pulse 88   Temp 98.9 F (37.2 C)   Resp  16   Ht 5' 7"  (1.702 m)   Wt 220 lb (99.8 kg)   LMP 03/23/2013 Comment: DUB  BMI 34.46 kg/m  BP Readings from Last 3 Encounters:  09/24/19 109/69  02/03/18 124/78  06/10/17 110/70   Wt Readings from Last 3 Encounters:  09/24/19 220 lb (99.8 kg)  05/31/19 201 lb (91.2 kg)  02/03/18 186 lb 8 oz (84.6 kg)      Physical Exam Vitals reviewed.  Constitutional:      General: She is not in acute distress.    Appearance: Normal appearance. She is well-developed. She is obese. She is not ill-appearing or diaphoretic.  HENT:     Head: Normocephalic and atraumatic.     Right Ear: Tympanic membrane, ear canal and external ear normal.     Left Ear: Tympanic membrane, ear canal and external ear normal.  Eyes:     General: No scleral icterus.       Right eye: No discharge.        Left eye: No discharge.     Extraocular Movements: Extraocular movements intact.     Conjunctiva/sclera: Conjunctivae normal.     Pupils: Pupils are equal, round, and reactive to light.  Neck:     Thyroid: No thyromegaly.     Vascular: No JVD.     Trachea: No tracheal deviation.  Cardiovascular:     Rate and Rhythm: Normal rate and regular rhythm.     Pulses: Normal pulses.     Heart sounds: Normal heart sounds. No murmur heard.  No friction rub. No gallop.   Pulmonary:     Effort: Pulmonary effort is normal. No respiratory distress.     Breath sounds: Normal breath  sounds. No wheezing or rales.  Chest:     Chest wall: No tenderness.  Abdominal:     General: Abdomen is flat. Bowel sounds are normal. There is no distension.     Palpations: Abdomen is soft. There is no mass.     Tenderness: There is no abdominal tenderness. There is no guarding or rebound.  Musculoskeletal:        General: No tenderness. Normal range of motion.     Cervical back: Normal range of motion and neck supple.     Right lower leg: No edema.     Left lower leg: No edema.  Lymphadenopathy:     Cervical: No cervical adenopathy.    Skin:    General: Skin is warm and dry.     Capillary Refill: Capillary refill takes less than 2 seconds.     Findings: Rash (erythematous, scaly with occasional fissures between 3rd and 4th fingers hands bilaterally) present.  Neurological:     General: No focal deficit present.     Mental Status: She is alert and oriented to person, place, and time. Mental status is at baseline.  Psychiatric:        Mood and Affect: Mood normal.        Behavior: Behavior normal.        Thought Content: Thought content normal.        Judgment: Judgment normal.      Last depression screening scores PHQ 2/9 Scores 09/24/2019 02/01/2017 06/14/2014  PHQ - 2 Score 0 1 0  PHQ- 9 Score 5 3 -   Last fall risk screening Fall Risk  09/24/2019  Falls in the past year? 0  Number falls in past yr: 0  Injury with Fall? 0  Risk for fall due to : No Fall Risks  Follow up Falls evaluation completed   Last Audit-C alcohol use screening Alcohol Use Disorder Test (AUDIT) 09/24/2019  1. How often do you have a drink containing alcohol? 0  2. How many drinks containing alcohol do you have on a typical day when you are drinking? 0  3. How often do you have six or more drinks on one occasion? 0  AUDIT-C Score 0  Alcohol Brief Interventions/Follow-up AUDIT Score <7 follow-up not indicated   A score of 3 or more in women, and 4 or more in men indicates increased risk for alcohol abuse, EXCEPT if all of the points are from question 1   No results found for any visits on 09/24/19.  Assessment & Plan    Routine Health Maintenance and Physical Exam  Exercise Activities and Dietary recommendations Goals   None     Immunization History  Administered Date(s) Administered  . Td 09/17/2008  . Tdap 09/17/2008    Health Maintenance  Topic Date Due  . COVID-19 Vaccine (1) Never done  . TETANUS/TDAP  09/18/2018  . PAP SMEAR-Modifier  06/04/2019  . INFLUENZA VACCINE  Never done  . Hepatitis C Screening  Completed   . HIV Screening  Completed    Discussed health benefits of physical activity, and encouraged her to engage in regular exercise appropriate for her age and condition.  1. Annual physical exam Normal physical exam today. Labs reviewed from 06/28/19 with patient today. She is to call the office in the meantime if she has any acute issue, questions or concerns.  2. History of melanoma On left shoulder, s/p resection. Needs referral back to dermatology. Not seen in a few years.  -  Ambulatory referral to Dermatology  3. Class 1 obesity due to excess calories with serious comorbidity and body mass index (BMI) of 34.0 to 34.9 in adult Has tried and failed multiple pharmacologic agents. Would like to undergo evaluation to see what her resting metabolic rate is and what options she has to help maintain weight loss. Will refer to Bariatric clinic in [place].  - Amb Referral to Bariatric Surgery  4. Dyshidrotic eczema Will try mometasone for eczema. Advised to avoid chemicals and hot water. Referral to dermatology.  - Ambulatory referral to Dermatology - mometasone (ELOCON) 0.1 % cream; Apply 1 application topically daily.  Dispense: 45 g; Refill: 0   No follow-ups on file.     Reynolds Bowl, PA-C, have reviewed all documentation for this visit. The documentation on 09/27/19 for the exam, diagnosis, procedures, and orders are all accurate and complete.   Rubye Beach  [facility] [phone] (phone) [phone] (fax)  [place]

## 2019-09-24 NOTE — Patient Instructions (Signed)
Health Maintenance, Female Adopting a healthy lifestyle and getting preventive care are important in promoting health and wellness. Ask your health care provider about:  The right schedule for you to have regular tests and exams.  Things you can do on your own to prevent diseases and keep yourself healthy. What should I know about diet, weight, and exercise? Eat a healthy diet   Eat a diet that includes plenty of vegetables, fruits, low-fat dairy products, and lean protein.  Do not eat a lot of foods that are high in solid fats, added sugars, or sodium. Maintain a healthy weight Body mass index (BMI) is used to identify weight problems. It estimates body fat based on height and weight. Your health care provider can help determine your BMI and help you achieve or maintain a healthy weight. Get regular exercise Get regular exercise. This is one of the most important things you can do for your health. Most adults should:  Exercise for at least 150 minutes each week. The exercise should increase your heart rate and make you sweat (moderate-intensity exercise).  Do strengthening exercises at least twice a week. This is in addition to the moderate-intensity exercise.  Spend less time sitting. Even light physical activity can be beneficial. Watch cholesterol and blood lipids Have your blood tested for lipids and cholesterol at 33 years of age, then have this test every 5 years. Have your cholesterol levels checked more often if:  Your lipid or cholesterol levels are high.  You are older than 33 years of age.  You are at high risk for heart disease. What should I know about cancer screening? Depending on your health history and family history, you may need to have cancer screening at various ages. This may include screening for:  Breast cancer.  Cervical cancer.  Colorectal cancer.  Skin cancer.  Lung cancer. What should I know about heart disease, diabetes, and high blood  pressure? Blood pressure and heart disease  High blood pressure causes heart disease and increases the risk of stroke. This is more likely to develop in people who have high blood pressure readings, are of African descent, or are overweight.  Have your blood pressure checked: ? Every 3-5 years if you are 18-39 years of age. ? Every year if you are 40 years old or older. Diabetes Have regular diabetes screenings. This checks your fasting blood sugar level. Have the screening done:  Once every three years after age 40 if you are at a normal weight and have a low risk for diabetes.  More often and at a younger age if you are overweight or have a high risk for diabetes. What should I know about preventing infection? Hepatitis B If you have a higher risk for hepatitis B, you should be screened for this virus. Talk with your health care provider to find out if you are at risk for hepatitis B infection. Hepatitis C Testing is recommended for:  Everyone born from 1945 through 1965.  Anyone with known risk factors for hepatitis C. Sexually transmitted infections (STIs)  Get screened for STIs, including gonorrhea and chlamydia, if: ? You are sexually active and are younger than 33 years of age. ? You are older than 33 years of age and your health care provider tells you that you are at risk for this type of infection. ? Your sexual activity has changed since you were last screened, and you are at increased risk for chlamydia or gonorrhea. Ask your health care provider if   you are at risk.  Ask your health care provider about whether you are at high risk for HIV. Your health care provider may recommend a prescription medicine to help prevent HIV infection. If you choose to take medicine to prevent HIV, you should first get tested for HIV. You should then be tested every 3 months for as long as you are taking the medicine. Pregnancy  If you are about to stop having your period (premenopausal) and  you may become pregnant, seek counseling before you get pregnant.  Take 400 to 800 micrograms (mcg) of folic acid every day if you become pregnant.  Ask for birth control (contraception) if you want to prevent pregnancy. Osteoporosis and menopause Osteoporosis is a disease in which the bones lose minerals and strength with aging. This can result in bone fractures. If you are 65 years old or older, or if you are at risk for osteoporosis and fractures, ask your health care provider if you should:  Be screened for bone loss.  Take a calcium or vitamin D supplement to lower your risk of fractures.  Be given hormone replacement therapy (HRT) to treat symptoms of menopause. Follow these instructions at home: Lifestyle  Do not use any products that contain nicotine or tobacco, such as cigarettes, e-cigarettes, and chewing tobacco. If you need help quitting, ask your health care provider.  Do not use street drugs.  Do not share needles.  Ask your health care provider for help if you need support or information about quitting drugs. Alcohol use  Do not drink alcohol if: ? Your health care provider tells you not to drink. ? You are pregnant, may be pregnant, or are planning to become pregnant.  If you drink alcohol: ? Limit how much you use to 0-1 drink a day. ? Limit intake if you are breastfeeding.  Be aware of how much alcohol is in your drink. In the [place] one drink equals one 12 oz bottle of beer (355 mL), one 5 oz glass of wine (148 mL), or one 1 oz glass of hard liquor (44 mL). General instructions  Schedule regular health, dental, and eye exams.  Stay current with your vaccines.  Tell your health care provider if: ? You often feel depressed. ? You have ever been abused or do not feel safe at home. Summary  Adopting a healthy lifestyle and getting preventive care are important in promoting health and wellness.  Follow your health care provider's instructions about healthy  diet, exercising, and getting tested or screened for diseases.  Follow your health care provider's instructions on monitoring your cholesterol and blood pressure. This information is not intended to replace advice given to you by your health care provider. Make sure you discuss any questions you have with your health care provider. Document Revised: 12/14/2017 Document Reviewed: 12/14/2017 Elsevier Patient Education  2020 [employer]  

## 2019-09-27 ENCOUNTER — Encounter: Payer: Self-pay | Admitting: Physician Assistant

## 2019-10-23 ENCOUNTER — Encounter: Payer: Self-pay | Admitting: Physician Assistant

## 2019-10-24 ENCOUNTER — Ambulatory Visit (INDEPENDENT_AMBULATORY_CARE_PROVIDER_SITE_OTHER): Payer: Managed Care, Other (non HMO) | Admitting: Family Medicine

## 2019-10-24 ENCOUNTER — Encounter (INDEPENDENT_AMBULATORY_CARE_PROVIDER_SITE_OTHER): Payer: Self-pay | Admitting: Family Medicine

## 2019-10-24 ENCOUNTER — Other Ambulatory Visit: Payer: Self-pay

## 2019-10-24 VITALS — BP 117/66 | HR 76 | Temp 98.3°F | Ht 67.0 in | Wt 220.0 lb

## 2019-10-24 DIAGNOSIS — E282 Polycystic ovarian syndrome: Secondary | ICD-10-CM

## 2019-10-24 DIAGNOSIS — E6609 Other obesity due to excess calories: Secondary | ICD-10-CM

## 2019-10-24 DIAGNOSIS — Z91018 Allergy to other foods: Secondary | ICD-10-CM

## 2019-10-24 DIAGNOSIS — R5383 Other fatigue: Secondary | ICD-10-CM | POA: Diagnosis not present

## 2019-10-24 DIAGNOSIS — R0602 Shortness of breath: Secondary | ICD-10-CM

## 2019-10-24 DIAGNOSIS — F3289 Other specified depressive episodes: Secondary | ICD-10-CM

## 2019-10-24 DIAGNOSIS — Z0289 Encounter for other administrative examinations: Secondary | ICD-10-CM

## 2019-10-24 DIAGNOSIS — Z6834 Body mass index (BMI) 34.0-34.9, adult: Secondary | ICD-10-CM

## 2019-10-24 DIAGNOSIS — F908 Attention-deficit hyperactivity disorder, other type: Secondary | ICD-10-CM

## 2019-10-24 DIAGNOSIS — F411 Generalized anxiety disorder: Secondary | ICD-10-CM

## 2019-10-24 DIAGNOSIS — K588 Other irritable bowel syndrome: Secondary | ICD-10-CM

## 2019-10-24 MED ORDER — PHENTERMINE HCL 37.5 MG PO TABS: ORAL_TABLET | ORAL | 0 refills | Status: DC

## 2019-10-24 NOTE — Telephone Encounter (Signed)
Josie can you see if you can get this to print please?

## 2019-10-25 NOTE — Progress Notes (Signed)
Dear Fenton Malling, PA-C,   Thank you for referring AUBREYANA SALTZ to our clinic. The following note includes my evaluation and treatment recommendations.  Chief Complaint:   OBESITY DONNELLE RUBEY (MR# [id number]) is a 34 y.o. female who presents for evaluation and treatment of obesity and related comorbidities. Current BMI is Body mass index is 34.46 kg/m. Jocilyn has been struggling with her weight for many years and has been unsuccessful in either losing weight, maintaining weight loss, or reaching her healthy weight goal.  Chabely is currently in the action stage of change and ready to dedicate time achieving and maintaining a healthier weight. Alyssha is interested in becoming our patient and working on intensive lifestyle modifications including (but not limited to) diet and exercise for weight loss.  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  She lives with her husband ad 2 children (ages 90 and 26).    Shaquala's habits were reviewed today and are as follows: Her family eats meals together, her desired weight loss is 70 pounds, she has been heavy most of her life, she started gaining weight around age 20, her heaviest weight ever was her current weight, she is a picky eater and doesn't like to eat healthier foods, she craves salty foods and soda (Dr. Malachi Bonds), she skips breakfast frequently, she is frequently drinking liquids with calories, she frequently makes poor food choices, she has problems with excessive hunger, she frequently eats larger portions than normal and she struggles with emotional eating.  Depression Screen Goldy's Food and Mood (modified PHQ-9) score was 14.  Depression screen PHQ 2/9 10/24/2019  Decreased Interest 1  Down, Depressed, Hopeless 2  PHQ - 2 Score 3  Altered sleeping 0  Tired, decreased energy 2  Change in appetite 2  Feeling bad or failure about yourself  3  Trouble concentrating 3  Moving slowly or  fidgety/restless 1  Suicidal thoughts 0  PHQ-9 Score 14  Difficult doing work/chores Somewhat difficult   Assessment/Plan:   1. Other fatigue Cristian denies daytime somnolence and denies waking up still tired. Patent has a history of symptoms of morning headache. Lasondra generally gets 7 hours of sleep per night, and states that she has generally restful sleep. Snoring is not present. Apneic episodes are not present. Epworth Sleepiness Score is 4.  Barbarajean does feel that her weight is causing her energy to be lower than it should be. Fatigue may be related to obesity, depression or many other causes. Labs will be ordered, and in the meanwhile, Novelle will focus on self care including making healthy food choices, increasing physical activity and focusing on stress reduction.  - EKG 12-Lead - VITAMIN D 25 Hydroxy (Vit-D Deficiency, Fractures) - Anemia panel  2. SOB (shortness of breath) on exertion Ebony Hail notes increasing shortness of breath with exercising and seems to be worsening over time with weight gain. She notes getting out of breath sooner with activity than she used to. This has gotten worse recently. Ralonda denies shortness of breath at rest or orthopnea.  Jaselyn does feel that she gets out of breath more easily that she used to when she exercises. Shatori's shortness of breath appears to be obesity related and exercise induced. She has agreed to work on weight loss and gradually increase exercise to treat her exercise induced shortness of breath. Will continue to monitor closely.  3. PCOS (polycystic ovarian syndrome) Jazlene has history of hysterectomy.  She will continue to  focus on protein-rich, low simple carbohydrate foods. We reviewed the importance of hydration, regular exercise for stress reduction, and restorative sleep.   - Comprehensive metabolic panel - Insulin, Free (Bioactive) - 17-Hydroxyprogesterone - Androstenedione - DHEA-sulfate - FSH/LH -  Testos,Total,Free and SHBG (Female)  4. 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  She says that 90% of foods cause her to have bloating, gas, and an upset stomach.  5. Attention deficit hyperactivity disorder (ADHD) Ebony Hail endorses ADHD.  She is not currently taking any medication for this.    6. Other irritable bowel syndrome Fariha has IBS. She has a bowel movement every other day some weeks, and then only 1-2 times on other weeks, along with an upset stomach.   7. GAD (generalized anxiety disorder) Behavior modification techniques were discussed today to help Toccara deal with her anxiety.  Orders and follow up as documented in patient record.   8. Other depression, with emotional eating Marcela is struggling with emotional eating and using food for comfort to the extent that it is negatively impacting her health. She has been working on behavior modification techniques to help reduce her emotional eating and has been unsuccessful. She shows no sign of suicidal or homicidal ideations.  PHQ-9 is 14.  Behavior modification techniques were discussed today to help Sharissa deal with her emotional/non-hunger eating behaviors.  Orders and follow up as documented in patient record.   9. Class 1 obesity due to excess calories with serious comorbidity and body mass index (BMI) of 34.0 to 34.9 in adult Taci has been treated with several anti-obesity medications in the past. She did well with phentermine, but was told that she could not take it for longer than three months at a time.   This patient 1) has no evidence of serious cardiovascular disease; 2) does not have serious psychiatric disease or a history of substance abuse; 3) has been informed about weight loss medications that are FDA-approved for long term use whereas phentermine is not.  Patient understands that all anti-obesity medications are contraindicated in  pregnancy. Patient denies a history of glaucoma. Patient understands that long-term use of phentermine is considered off-label use of this medication, however, that the Endocrine Society and recent research supports that long-term use of phentermine does not appear to have detrimental health effects if it is used in an appropriate patient. In addition, a 2019 study published in Obesity Journal on [id number] patients concluded that "recommendations to limit phentermine to less than 3 months do not align with current concepts of pharmacologic treatment of obesity", and that "long term phentermine users experience greater weight loss without apparent increases in cardiovascular risk".  The potential risks and benefits of phentermine have been reviewed with the patient, and alternative treatment options were discussed. All questions were answered, and the patient wishes to move forward with this medication.  - Start phentermine (ADIPEX-P) 37.5 MG tablet; 1/2 tab daily x 2 weeks, then 1 tab daily  Dispense: 30 tablet; Refill: 0  I have consulted the [place] Controlled Substances Registry for this patient, and feel the risk/benefit ratio today is favorable for proceeding with this prescription for a controlled substance. The patient understands monitoring parameters and red flags.   Brynne is currently in the action stage of change and her goal is to continue with weight loss efforts. I recommend Denee begin the structured treatment plan as follows:  She has agreed to keeping a food journal and adhering  to recommended goals of 1400-1500 calories and 95 grams of protein.  Exercise goals: No exercise has been prescribed at this time.   Behavioral modification strategies: increasing lean protein intake, decreasing simple carbohydrates, increasing vegetables, increasing water intake and decreasing liquid calories.  She was informed of the importance of frequent follow-up visits to maximize her success with intensive  lifestyle modifications for her multiple health conditions. She was informed we would discuss her lab results at her next visit unless there is a critical issue that needs to be addressed sooner. Gabriele agreed to keep her next visit at the agreed upon time to discuss these results.  Objective:   Blood pressure 117/66, pulse 76, temperature 98.3 F (36.8 C), temperature source Oral, height 5\' 7"  (1.702 m), weight 220 lb (99.8 kg), last menstrual period 03/23/2013, SpO2 98 %. Body mass index is 34.46 kg/m.  EKG: Normal sinus rhythm, rate 82 bpm.  Indirect Calorimeter completed today shows a VO2 of 270 and a REE of 1882.  Her calculated basal metabolic rate is 5284 thus her basal metabolic rate is better than expected.  General: Cooperative, alert, well developed, in no acute distress. HEENT: Conjunctivae and lids unremarkable. Cardiovascular: Regular rhythm.  Lungs: Normal work of breathing. Neurologic: No focal deficits.   Lab Results  Component Value Date   CREATININE 0.56 (L) 10/24/2019   BUN 11 10/24/2019   NA 139 10/24/2019   K 4.3 10/24/2019   CL 102 10/24/2019   CO2 25 10/24/2019   Lab Results  Component Value Date   ALT 7 10/24/2019   AST 12 10/24/2019   ALKPHOS 89 10/24/2019   BILITOT 0.4 10/24/2019   Lab Results  Component Value Date   HGBA1C 5.3 08/29/2013   Lab Results  Component Value Date   TSH 2.160 06/28/2019   Lab Results  Component Value Date   CHOL 176 06/28/2019   HDL 50 06/28/2019   LDLCALC 103 (H) 06/28/2019   TRIG 131 06/28/2019   CHOLHDL 3.5 06/28/2019   Lab Results  Component Value Date   WBC 6.0 06/28/2019   HGB 12.8 06/28/2019   HCT 37.8 10/24/2019   MCV 96 06/28/2019   PLT 228 06/28/2019   Lab Results  Component Value Date   IRON 83 10/24/2019   TIBC 304 10/24/2019   FERRITIN 15 10/24/2019   Attestation Statements:   Reviewed by clinician on day of visit: allergies, medications, problem list, medical history, surgical  history, family history, social history, and previous encounter notes.  This is the patient's first visit at [facility]. The patient's NEW PATIENT PACKET was reviewed at length. Included in the packet: current and past health history, medications, allergies, ROS, gynecologic history (women only), surgical history, family history, social history, weight history, weight loss surgery history (for those that have had weight loss surgery), nutritional evaluation, mood and food questionnaire, PHQ9, Epworth questionnaire, sleep habits questionnaire, patient life and health improvement goals questionnaire. These will all be scanned into the patient's chart under media.   During the visit, I independently reviewed the patient's EKG, bioimpedance scale results, and indirect calorimeter results. I used this information to tailor a meal plan for the patient that will help her to lose weight and will improve her obesity-related conditions going forward. I performed a medically necessary appropriate examination and/or evaluation. I discussed the assessment and treatment plan with the patient. The patient was provided an opportunity to ask questions and all were answered. The patient agreed with the plan  and demonstrated an understanding of the instructions. Labs were ordered at this visit and will be reviewed at the next visit unless more critical results need to be addressed immediately. Clinical information was updated and documented in the EMR.   I, Water quality scientist, CMA, am acting as transcriptionist for Briscoe Deutscher, DO  I have reviewed the above documentation for accuracy and completeness, and I agree with the above. Briscoe Deutscher, DO

## 2019-10-28 LAB — DHEA-SULFATE: DHEA-SO4: 135 ug/dL (ref 84.8–378.0)

## 2019-10-28 LAB — COMPREHENSIVE METABOLIC PANEL
ALT: 7 IU/L (ref 0–32)
AST: 12 IU/L (ref 0–40)
Albumin/Globulin Ratio: 1.9 (ref 1.2–2.2)
Albumin: 4.6 g/dL (ref 3.8–4.8)
Alkaline Phosphatase: 89 IU/L (ref 44–121)
BUN/Creatinine Ratio: 20 (ref 9–23)
BUN: 11 mg/dL (ref 6–20)
Bilirubin Total: 0.4 mg/dL (ref 0.0–1.2)
CO2: 25 mmol/L (ref 20–29)
Calcium: 9.4 mg/dL (ref 8.7–10.2)
Chloride: 102 mmol/L (ref 96–106)
Creatinine, Ser: 0.56 mg/dL — ABNORMAL LOW (ref 0.57–1.00)
GFR calc Af Amer: 143 mL/min/{1.73_m2} (ref >59)
GFR calc non Af Amer: 124 mL/min/{1.73_m2} (ref >59)
Globulin, Total: 2.4 g/dL (ref 1.5–4.5)
Glucose: 81 mg/dL (ref 65–99)
Potassium: 4.3 mmol/L (ref 3.5–5.2)
Sodium: 139 mmol/L (ref 134–144)
Total Protein: 7 g/dL (ref 6.0–8.5)

## 2019-10-28 LAB — ANEMIA PANEL
Ferritin: 15 ng/mL (ref 15–150)
Folate, Hemolysate: 379 ng/mL
Folate, RBC: 1003 ng/mL (ref >498)
Hematocrit: 37.8 % (ref 34.0–46.6)
Iron Saturation: 27 % (ref 15–55)
Iron: 83 ug/dL (ref 27–159)
Retic Ct Pct: 1.4 % (ref 0.6–2.6)
Total Iron Binding Capacity: 304 ug/dL (ref 250–450)
UIBC: 221 ug/dL (ref 131–425)
Vitamin B-12: 163 pg/mL — ABNORMAL LOW (ref 232–1245)

## 2019-10-28 LAB — VITAMIN D 25 HYDROXY (VIT D DEFICIENCY, FRACTURES): Vit D, 25-Hydroxy: 16.6 ng/mL — ABNORMAL LOW (ref 30.0–100.0)

## 2019-10-28 LAB — 17-HYDROXYPROGESTERONE: 17-Hydroxyprogesterone: 80 ng/dL

## 2019-10-28 LAB — ANDROSTENEDIONE: Androstenedione: 121 ng/dL (ref 41–262)

## 2019-10-28 LAB — FSH/LH
FSH: 6.3 m[IU]/mL
LH: 24 m[IU]/mL

## 2019-11-07 ENCOUNTER — Ambulatory Visit (INDEPENDENT_AMBULATORY_CARE_PROVIDER_SITE_OTHER): Payer: Managed Care, Other (non HMO) | Admitting: Family Medicine

## 2019-11-07 ENCOUNTER — Encounter (INDEPENDENT_AMBULATORY_CARE_PROVIDER_SITE_OTHER): Payer: Self-pay | Admitting: Family Medicine

## 2019-11-07 ENCOUNTER — Other Ambulatory Visit: Payer: Self-pay

## 2019-11-07 VITALS — BP 103/67 | HR 74 | Temp 98.0°F | Ht 67.0 in | Wt 223.0 lb

## 2019-11-07 DIAGNOSIS — R790 Abnormal level of blood mineral: Secondary | ICD-10-CM

## 2019-11-07 DIAGNOSIS — E559 Vitamin D deficiency, unspecified: Secondary | ICD-10-CM

## 2019-11-07 DIAGNOSIS — E538 Deficiency of other specified B group vitamins: Secondary | ICD-10-CM

## 2019-11-07 DIAGNOSIS — E6609 Other obesity due to excess calories: Secondary | ICD-10-CM | POA: Diagnosis not present

## 2019-11-07 DIAGNOSIS — Z6834 Body mass index (BMI) 34.0-34.9, adult: Secondary | ICD-10-CM

## 2019-11-07 DIAGNOSIS — Z9189 Other specified personal risk factors, not elsewhere classified: Secondary | ICD-10-CM

## 2019-11-08 MED ORDER — VITAMIN D (ERGOCALCIFEROL) 1.25 MG (50000 UNIT) PO CAPS: 50000.0000 [IU] | ORAL_CAPSULE | ORAL | 0 refills | Status: DC

## 2019-11-08 MED ORDER — "SYRINGE/NEEDLE (DISP) 25G X 1"" 3 ML MISC": 0 refills | Status: DC

## 2019-11-08 MED ORDER — CYANOCOBALAMIN 1000 MCG/ML IJ SOLN: INTRAMUSCULAR | 0 refills | Status: DC

## 2019-11-08 NOTE — Progress Notes (Signed)
Chief Complaint:   OBESITY Debra Hernandez is here to discuss her progress with her obesity treatment plan along with follow-up of her obesity related diagnoses.   Today's visit was #: 2 Starting weight: 220 lbs Starting date: 10/24/2019 Today's weight: 223 lbs Today's date: 11/07/2019 Total lbs lost to date: +3 lbs Body mass index is 34.93 kg/m.   Interim History: Ellington's testosterone was not drawn at last visit.  She started taking 1/2 tablet of phentermine on 10/29.  She says that it is helping to curb her appetite.  Nutrition Plan: keeping a food journal and adhering to recommended goals of 1400-1500 calories and 95 grams of protein for 100% of the time.  Anti-obesity medications: phentermine. Reported side effects: None. Hunger is well controlled controlled. Cravings are moderately controlled controlled.  Activity: Cardio for 30 minutes 4-5 times per week. Sleep: Sleep is restful.   Assessment/Plan:   1. Vitamin D deficiency Current vitamin D is 16.6, tested on 10/24/2019. Not at goal. Optimal goal > 50 ng/dL.   Plan:  Start vitamin D 50,000 IU weekly, as per below.  - Start Vitamin D, Ergocalciferol, (DRISDOL) 1.25 MG (50000 UNIT) CAPS capsule; Take 1 capsule (50,000 Units total) by mouth every 7 (seven) days.  Dispense: 4 capsule; Refill: 0  2. B12 deficiency She notes fatigue. She is not a vegetarian.  She does not have a previous diagnosis of pernicious anemia.  She does not have a history of weight loss surgery.   Plan:  Due to severely low B12, we discussed oral v IM treatment. After discussion, patient would like to start below medication. Expectations, risks, and potential side effects reviewed.   Lab Results  Component Value Date   VITAMINB12 163 (L) 10/24/2019   - Start cyanocobalamin (,VITAMIN B-12,) 1000 MCG/ML injection; Inject weekly x 4 weeks, then every 30 days after  Dispense: 10 mL; Refill: 0 - SYRINGE-NEEDLE, DISP, 3 ML 25G X 1" 3 ML MISC; Use to give  b12  Dispense: 100 each; Refill: 0  3. Low ferritin level Debra Hernandez has history of hysterectomy.  She will start a prenatal vitamin with iron. We will recheck in 2-3 months.   Lab Results  Component Value Date   FERRITIN 15 10/24/2019   4. At risk for deficient intake of food [name] was given approximately 15 minutes of deficit intake of food prevention counseling today. Debra Hernandez is at risk for eating too few calories based on current food recall. She was encouraged to focus on meeting caloric and protein goals according to her recommended meal plan.   5. Class 1 obesity due to excess calories with serious comorbidity and body mass index (BMI) of 34.0 to 34.9 in adult  Course: Carletta is currently in the action stage of change. As such, her goal is to continue with weight loss efforts.   Nutrition goals: She has agreed to keeping a food journal and adhering to recommended goals of 1200-1500 calories and 95 grams of protein.   Exercise goals: For substantial health benefits, adults should do at least 150 minutes (2 hours and 30 minutes) a week of moderate-intensity, or 75 minutes (1 hour and 15 minutes) a week of vigorous-intensity aerobic physical activity, or an equivalent combination of moderate- and vigorous-intensity aerobic activity. Aerobic activity should be performed in episodes of at least 10 minutes, and preferably, it should be spread throughout the week.  Behavioral modification strategies: increasing lean protein intake, decreasing simple carbohydrates, increasing vegetables and increasing water intake.  Debra Hernandez has agreed to follow-up with our clinic in 3 weeks. She was informed of the importance of frequent follow-up visits to maximize her success with intensive lifestyle modifications for her multiple health conditions.   Objective:   Blood pressure 103/67, pulse 74, temperature 98 F (36.7 C), temperature source Oral, height 5\' 7"  (1.702 m), weight 223 lb (101.2 kg), last  menstrual period 03/23/2013, SpO2 97 %. Body mass index is 34.93 kg/m.  General: Cooperative, alert, well developed, in no acute distress. HEENT: Conjunctivae and lids unremarkable. Cardiovascular: Regular rhythm.  Lungs: Normal work of breathing. Neurologic: No focal deficits.   Lab Results  Component Value Date   CREATININE 0.56 (L) 10/24/2019   BUN 11 10/24/2019   NA 139 10/24/2019   K 4.3 10/24/2019   CL 102 10/24/2019   CO2 25 10/24/2019   Lab Results  Component Value Date   ALT 7 10/24/2019   AST 12 10/24/2019   ALKPHOS 89 10/24/2019   BILITOT 0.4 10/24/2019   Lab Results  Component Value Date   HGBA1C 5.3 08/29/2013   Lab Results  Component Value Date   TSH 2.160 06/28/2019   Lab Results  Component Value Date   CHOL 176 06/28/2019   HDL 50 06/28/2019   LDLCALC 103 (H) 06/28/2019   TRIG 131 06/28/2019   CHOLHDL 3.5 06/28/2019   Lab Results  Component Value Date   WBC 6.0 06/28/2019   HGB 12.8 06/28/2019   HCT 37.8 10/24/2019   MCV 96 06/28/2019   PLT 228 06/28/2019   Lab Results  Component Value Date   IRON 83 10/24/2019   TIBC 304 10/24/2019   FERRITIN 15 10/24/2019   Attestation Statements:   Reviewed by clinician on day of visit: allergies, medications, problem list, medical history, surgical history, family history, social history, and previous encounter notes.  I, Water quality scientist, CMA, am acting as transcriptionist for Briscoe Deutscher, DO  I have reviewed the above documentation for accuracy and completeness, and I agree with the above. Briscoe Deutscher, DO

## 2019-12-03 ENCOUNTER — Encounter: Payer: Self-pay | Admitting: Physician Assistant

## 2019-12-04 ENCOUNTER — Other Ambulatory Visit: Payer: Self-pay

## 2019-12-04 ENCOUNTER — Other Ambulatory Visit (INDEPENDENT_AMBULATORY_CARE_PROVIDER_SITE_OTHER): Payer: Self-pay

## 2019-12-04 ENCOUNTER — Encounter (INDEPENDENT_AMBULATORY_CARE_PROVIDER_SITE_OTHER): Payer: Self-pay | Admitting: Physician Assistant

## 2019-12-04 ENCOUNTER — Ambulatory Visit (INDEPENDENT_AMBULATORY_CARE_PROVIDER_SITE_OTHER): Payer: Managed Care, Other (non HMO) | Admitting: Physician Assistant

## 2019-12-04 VITALS — BP 99/67 | HR 101 | Temp 98.2°F | Ht 67.0 in | Wt 215.0 lb

## 2019-12-04 DIAGNOSIS — E6609 Other obesity due to excess calories: Secondary | ICD-10-CM | POA: Diagnosis not present

## 2019-12-04 DIAGNOSIS — E559 Vitamin D deficiency, unspecified: Secondary | ICD-10-CM

## 2019-12-04 DIAGNOSIS — Z9189 Other specified personal risk factors, not elsewhere classified: Secondary | ICD-10-CM | POA: Diagnosis not present

## 2019-12-04 DIAGNOSIS — Z6834 Body mass index (BMI) 34.0-34.9, adult: Secondary | ICD-10-CM

## 2019-12-04 DIAGNOSIS — E66811 Obesity, class 1: Secondary | ICD-10-CM

## 2019-12-04 DIAGNOSIS — E282 Polycystic ovarian syndrome: Secondary | ICD-10-CM | POA: Diagnosis not present

## 2019-12-04 DIAGNOSIS — E538 Deficiency of other specified B group vitamins: Secondary | ICD-10-CM | POA: Diagnosis not present

## 2019-12-05 ENCOUNTER — Other Ambulatory Visit (INDEPENDENT_AMBULATORY_CARE_PROVIDER_SITE_OTHER): Payer: Self-pay | Admitting: Physician Assistant

## 2019-12-05 DIAGNOSIS — E559 Vitamin D deficiency, unspecified: Secondary | ICD-10-CM

## 2019-12-05 LAB — TESTOSTERONE,FREE AND TOTAL
Testosterone, Free: 1.3 pg/mL (ref 0.0–4.2)
Testosterone: 11 ng/dL (ref 8–60)

## 2019-12-05 MED ORDER — "SYRINGE/NEEDLE (DISP) 25G X 1"" 3 ML MISC": 0 refills | Status: DC

## 2019-12-05 MED ORDER — CYANOCOBALAMIN 1000 MCG/ML IJ SOLN: INTRAMUSCULAR | 0 refills | Status: DC

## 2019-12-05 MED ORDER — PHENTERMINE HCL 37.5 MG PO TABS: ORAL_TABLET | ORAL | 0 refills | Status: DC

## 2019-12-05 MED ORDER — VITAMIN D (ERGOCALCIFEROL) 1.25 MG (50000 UNIT) PO CAPS: 50000.0000 [IU] | ORAL_CAPSULE | ORAL | 0 refills | Status: DC

## 2019-12-05 NOTE — Telephone Encounter (Signed)
[facility] and they said that they only received a script for 28-30 days for the Vitamin D.

## 2019-12-05 NOTE — Progress Notes (Signed)
Chief Complaint:   OBESITY JANESIA JOSWICK is here to discuss her progress with her obesity treatment plan along with follow-up of her obesity related diagnoses. Beaux is keeping a food journal and adhering to recommended goals of 1400-1500 calories and 95 grams of protein and states she is following her eating plan approximately 100% of the time. Syndey states she is doing home workout 30 minutes 3 times per week.  Today's visit was #: 3 Starting weight: 220 lbs Starting date: 10/24/2019 Today's weight: 215 lbs Today's date: 12/05/2019 Total lbs lost to date: 5 Total lbs lost since last in-office visit: 8  Interim History: Dasha is averaging between 1000-1200 calories due to lack of hunger and is getting between 47-95 grams of protein daily. She is supplementing with Premier Protein. She reports drinking more water, averaging about 64 oz daily. She is asking about Vegetarian options.  Subjective:   B12 deficiency. Last  B12 level was not at goal. No history of bariatric surgery. Alexandrea is not a vegetarian.   Lab Results  Component Value Date   VITAMINB12 163 (L) 10/24/2019   Vitamin D deficiency. Zenith is on Vitamin D weekly. Last Vitamin D level was not at goal.   Ref. Range 10/24/2019 13:21  Vitamin D, 25-Hydroxy Latest Ref Range: 30.0 - 100.0 ng/mL 16.6 (L)   PCOS (polycystic ovarian syndrome). Glendale has a history of hysterectomy.  At risk for osteoporosis. Ahlam is at higher risk of osteopenia and osteoporosis due to Vitamin D deficiency.   Assessment/Plan:   B12 deficiency. The diagnosis was reviewed with the patient. Counseling provided today, see below. We will continue to monitor. Orders and follow up as documented in patient record. Refills were given for cyanocobalamin (,VITAMIN B-12,) 1000 MCG/ML injection 10 mL with 0 refills and SYRINGE-NEEDLE, DISP, 3 ML 25G X 1" 3 ML MISC #12 with 0 refills.  Counseling . The body needs vitamin B12: to make  red blood cells; to make DNA; and to help the nerves work properly so they can carry messages from the brain to the body.  . The main causes of vitamin B12 deficiency include dietary deficiency, digestive diseases, pernicious anemia, and having a surgery in which part of the stomach or small intestine is removed.  . Certain medicines can make it harder for the body to absorb vitamin B12. These medicines include: heartburn medications; some antibiotics; some medications used to treat diabetes, gout, and high cholesterol.  . In some cases, there are no symptoms of this condition. If the condition leads to anemia or nerve damage, various symptoms can occur, such as weakness or fatigue, shortness of breath, and numbness or tingling in your hands and feet.   . Treatment:  o May include taking vitamin B12 supplements.  o Avoid alcohol.  o Eat lots of healthy foods that contain vitamin B12: - Beef, pork, chicken, [place], and organ meats, such as liver.  - Seafood: This includes clams, rainbow trout, salmon, tuna, and haddock.  - Eggs.  - Cereal and dairy products that are fortified: This means that vitamin B12 has been added to the food.    Vitamin D deficiency. Low Vitamin D level contributes to fatigue and are associated with obesity, breast, and colon cancer. She was given a refill on her Vitamin D, Ergocalciferol, (DRISDOL) 1.25 MG (50000 UNIT) CAPS capsule every week #4 with 0 refills and will follow-up for routine testing of Vitamin D, at least 2-3 times per year to avoid over-replacement.  PCOS (polycystic ovarian syndrome).  Testosterone,Free and Total will be checked today.  At risk for osteoporosis. Ife was given approximately 15 minutes of osteoporosis prevention counseling today. Leveda is at risk for osteopenia and osteoporosis due to her Vitamin D deficiency. She was encouraged to take her Vitamin D and follow her higher calcium diet and increase strengthening exercise to help  strengthen her bones and decrease her risk of osteopenia and osteoporosis.  Repetitive spaced learning was employed today to elicit superior memory formation and behavioral change.  Class 1 obesity due to excess calories with serious comorbidity and body mass index (BMI) of 34.0 to 34.9 in adult. Refill for phentermine will be sent to Dr. Juleen China.  Khilynn is currently in the action stage of change. As such, her goal is to continue with weight loss efforts. She has agreed to keeping a food journal and adhering to recommended goals of 1400-1500 calories and 95 grams of protein daily.   Handout was provided on the [place] for reference.  Exercise goals: For substantial health benefits, adults should do at least 150 minutes (2 hours and 30 minutes) a week of moderate-intensity, or 75 minutes (1 hour and 15 minutes) a week of vigorous-intensity aerobic physical activity, or an equivalent combination of moderate- and vigorous-intensity aerobic activity. Aerobic activity should be performed in episodes of at least 10 minutes, and preferably, it should be spread throughout the week.  Behavioral modification strategies: increasing lean protein intake and keeping a strict food journal.  Samanthajo has agreed to follow-up with our clinic in 2 weeks. She was informed of the importance of frequent follow-up visits to maximize her success with intensive lifestyle modifications for her multiple health conditions.   Objective:   Blood pressure 99/67, pulse (!) 101, temperature 98.2 F (36.8 C), height 5\' 7"  (1.702 m), weight 215 lb (97.5 kg), last menstrual period 03/23/2013, SpO2 100 %. Body mass index is 33.67 kg/m.  General: Cooperative, alert, well developed, in no acute distress. HEENT: Conjunctivae and lids unremarkable. Cardiovascular: Regular rhythm.  Lungs: Normal work of breathing. Neurologic: No focal deficits.   Lab Results  Component Value Date   CREATININE 0.56 (L) 10/24/2019    BUN 11 10/24/2019   NA 139 10/24/2019   K 4.3 10/24/2019   CL 102 10/24/2019   CO2 25 10/24/2019   Lab Results  Component Value Date   ALT 7 10/24/2019   AST 12 10/24/2019   ALKPHOS 89 10/24/2019   BILITOT 0.4 10/24/2019   Lab Results  Component Value Date   HGBA1C 5.3 08/29/2013   No results found for: INSULIN Lab Results  Component Value Date   TSH 2.160 06/28/2019   Lab Results  Component Value Date   CHOL 176 06/28/2019   HDL 50 06/28/2019   LDLCALC 103 (H) 06/28/2019   TRIG 131 06/28/2019   CHOLHDL 3.5 06/28/2019   Lab Results  Component Value Date   WBC 6.0 06/28/2019   HGB 12.8 06/28/2019   HCT 37.8 10/24/2019   MCV 96 06/28/2019   PLT 228 06/28/2019   Lab Results  Component Value Date   IRON 83 10/24/2019   TIBC 304 10/24/2019   FERRITIN 15 10/24/2019   Attestation Statements:   Reviewed by clinician on day of visit: allergies, medications, problem list, medical history, surgical history, family history, social history, and previous encounter notes.  IMichaelene Song, am acting as Location manager for Abby Potash, PA-C   I have reviewed the above documentation for accuracy and  completeness, and I agree with the above. Abby Potash, PA-C

## 2019-12-05 NOTE — Progress Notes (Signed)
This is your patient.

## 2019-12-06 ENCOUNTER — Other Ambulatory Visit: Payer: Self-pay

## 2019-12-06 ENCOUNTER — Ambulatory Visit (INDEPENDENT_AMBULATORY_CARE_PROVIDER_SITE_OTHER): Payer: Managed Care, Other (non HMO) | Admitting: Dermatology

## 2019-12-06 DIAGNOSIS — L301 Dyshidrosis [pompholyx]: Secondary | ICD-10-CM

## 2019-12-06 DIAGNOSIS — B001 Herpesviral vesicular dermatitis: Secondary | ICD-10-CM

## 2019-12-06 DIAGNOSIS — L578 Other skin changes due to chronic exposure to nonionizing radiation: Secondary | ICD-10-CM

## 2019-12-06 DIAGNOSIS — B009 Herpesviral infection, unspecified: Secondary | ICD-10-CM | POA: Diagnosis not present

## 2019-12-06 DIAGNOSIS — L821 Other seborrheic keratosis: Secondary | ICD-10-CM

## 2019-12-06 DIAGNOSIS — Z1283 Encounter for screening for malignant neoplasm of skin: Secondary | ICD-10-CM | POA: Diagnosis not present

## 2019-12-06 DIAGNOSIS — L814 Other melanin hyperpigmentation: Secondary | ICD-10-CM

## 2019-12-06 DIAGNOSIS — L309 Dermatitis, unspecified: Secondary | ICD-10-CM | POA: Diagnosis not present

## 2019-12-06 DIAGNOSIS — D1800 Hemangioma unspecified site: Secondary | ICD-10-CM

## 2019-12-06 DIAGNOSIS — D229 Melanocytic nevi, unspecified: Secondary | ICD-10-CM

## 2019-12-06 DIAGNOSIS — Z86006 Personal history of melanoma in-situ: Secondary | ICD-10-CM

## 2019-12-06 MED ORDER — VALACYCLOVIR HCL 500 MG PO TABS: ORAL_TABLET | ORAL | 5 refills | Status: DC

## 2019-12-06 MED ORDER — MOMETASONE FUROATE 0.1 % EX CREA: TOPICAL_CREAM | CUTANEOUS | 2 refills | Status: DC

## 2019-12-06 NOTE — Patient Instructions (Addendum)
Melanoma ABCDEs  Melanoma is the most dangerous type of skin cancer, and is the leading cause of death from skin disease.  You are more likely to develop melanoma if you:  Have light-colored skin, light-colored eyes, or red or blond hair  Spend a lot of time in the sun  Tan regularly, either outdoors or in a tanning bed  Have had blistering sunburns, especially during childhood  Have a close family member who has had a melanoma  Have atypical moles or large birthmarks  Early detection of melanoma is key since treatment is typically straightforward and cure rates are extremely high if we catch it early.   The first sign of melanoma is often a change in a mole or a new dark spot.  The ABCDE system is a way of remembering the signs of melanoma.  A for asymmetry:  The two halves do not match. B for border:  The edges of the growth are irregular. C for color:  A mixture of colors are present instead of an even brown color. D for diameter:  Melanomas are usually (but not always) greater than 74mm - the size of a pencil eraser. E for evolution:  The spot keeps changing in size, shape, and color.  Please check your skin once per month between visits. You can use a small mirror in front and a large mirror behind you to keep an eye on the back side or your body.   If you see any new or changing lesions before your next follow-up, please call to schedule a visit.  Please continue daily skin protection including broad spectrum sunscreen SPF 30+ to sun-exposed areas, reapplying every 2 hours as needed when you're outdoors.   Topical steroids (such as triamcinolone, fluocinolone, fluocinonide, mometasone, clobetasol, halobetasol, betamethasone, hydrocortisone) can cause thinning and lightening of the skin if they are used for too long in the same area. Your physician has selected the right strength medicine for your problem and area affected on the body. Please use your medication only as directed  by your physician to prevent side effects.   Gentle Skin Care Guide  1. Bathe no more than once a day.  2. Avoid bathing in hot water  3. Use a mild soap like Dove, Vanicream, Cetaphil, CeraVe. Can use Lever 2000 or Cetaphil antibacterial soap  4. Use soap only where you need it. On most days, use it under your arms, between your legs, and on your feet. Let the water rinse other areas unless visibly dirty.  5. When you get out of the bath/shower, use a towel to gently blot your skin dry, don't rub it.  6. While your skin is still a little damp, apply a moisturizing cream such as Vanicream, CeraVe, Cetaphil, Eucerin, Sarna lotion or plain Vaseline Jelly. For hands apply Neutrogena [place] Hand Cream or Excipial Hand Cream.  7. Reapply moisturizer any time you start to itch or feel dry.  8. Sometimes using free and clear laundry detergents can be helpful. Fabric softener sheets should be avoided. Downy Free & Gentle liquid, or any liquid fabric softener that is free of dyes and perfumes, it acceptable to use  9. If your doctor has given you prescription creams you may apply moisturizers over them     Continue Valtrex 500mg  twice daily, increasing to 1000mg  twice daily for 3 days with break outs.

## 2019-12-06 NOTE — Progress Notes (Signed)
New Patient Visit  Subjective  Debra Hernandez is a 34 y.o. 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 She has gotten more than ususal this year. 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 ).  She also has eczema at hands, much worse recently than usual. She has used topical steroids (mometasone cream) in the past with good improvement.   The following portions of the chart were reviewed this encounter and updated as appropriate:   Tobacco  Allergies  Meds  Problems  Med Hx  Surg Hx  Fam Hx      Review of Systems:  No other skin or systemic complaints except as noted in HPI or Assessment and Plan.  Objective  Well appearing patient in no apparent distress; mood and affect are within normal limits.  A full examination was performed including scalp, head, eyes, ears, nose, lips, neck, chest, axillae, abdomen, back, buttocks, bilateral upper extremities, bilateral lower extremities, hands, feet, fingers, toes, fingernails, and toenails. All findings within normal limits unless otherwise noted below.  Objective  Bilateral Hands: Scaly pink plaques  Objective  Lips: Crusted pink plaque at lips   Assessment & Plan  Eczema, unspecified type Bilateral Hands  Chronic condition with expected duration over one year. Has had recurrences over the years. Condition is bothersome to patient. Currently flared.  Restart mometasone cream twice daily as needed to hands for eczema flares up to 3 weeks. Avoid applying to face, groin, and axilla. Use as directed. Risk of skin atrophy with long-term use reviewed.   Topical steroids (such as triamcinolone, fluocinolone, fluocinonide, mometasone, clobetasol, halobetasol, betamethasone, hydrocortisone) can cause thinning and lightening of the skin if they are used for too long in the same area. Your physician has selected the right strength  medicine for your problem and area affected on the body. Please use your medication only as directed by your physician to prevent side effects.    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 Chronic condition with expected duration over one year. Condition is bothersome to patient. Currently flared.  Start Valtrex 500mg  twice daily for prevention, increasing to 1000mg  twice daily for 3 days with break outs.   Chronic condition, no cure, only control. Currently flared.   Ordered Medications: valACYclovir (VALTREX) 500 MG tablet  Herpes labialis  Other Related Medications valACYclovir (VALTREX) 1000 MG tablet   Lentigines - Scattered tan macules - Discussed due to sun exposure - Benign, observe - Call for any changes  Seborrheic Keratoses - Stuck-on, waxy, tan-brown papules and plaques  - Discussed benign etiology and prognosis. - Observe - Call for any changes  Melanocytic Nevi - Tan-brown and/or pink-flesh-colored symmetric macules and papules - Benign appearing on exam today - Observation - Call clinic for new or changing moles - Recommend daily use of broad spectrum spf 30+ sunscreen to sun-exposed areas.   Hemangiomas - Red papules - Discussed benign nature - Observe - Call for any changes  Actinic Damage - Chronic, secondary to cumulative UV/sun exposure - diffuse scaly erythematous macules with underlying dyspigmentation - Recommend daily broad spectrum sunscreen SPF 30+ to sun-exposed areas, reapply every 2 hours as needed.  - Call for new or changing lesions.  Skin cancer screening performed today.  History of Melanoma in Situ - No evidence of recurrence today at  left upper back - Recommend regular full body skin exams - Recommend daily broad spectrum sunscreen SPF 30+ to sun-exposed areas, reapply every 2 hours as needed.  - Call if any new or changing  lesions are noted between office visits   Return in about 1 year (around 12/05/2020) for TBSE, 2-3 months for follow up.  Graciella Belton, RMA, am acting as scribe for Forest Gleason, MD .  Documentation: I have reviewed the above documentation for accuracy and completeness, and I agree with the above.  Forest Gleason, MD

## 2019-12-12 NOTE — Telephone Encounter (Signed)
Ok to send in a 90 day supply.  #[phone] thanks

## 2019-12-19 ENCOUNTER — Ambulatory Visit (INDEPENDENT_AMBULATORY_CARE_PROVIDER_SITE_OTHER): Payer: Managed Care, Other (non HMO) | Admitting: Family Medicine

## 2020-01-02 ENCOUNTER — Encounter: Payer: Self-pay | Admitting: Dermatology

## 2020-01-15 ENCOUNTER — Other Ambulatory Visit: Payer: Self-pay

## 2020-01-15 ENCOUNTER — Encounter (INDEPENDENT_AMBULATORY_CARE_PROVIDER_SITE_OTHER): Payer: Self-pay | Admitting: Family Medicine

## 2020-01-15 ENCOUNTER — Ambulatory Visit (INDEPENDENT_AMBULATORY_CARE_PROVIDER_SITE_OTHER): Payer: Managed Care, Other (non HMO) | Admitting: Family Medicine

## 2020-01-15 VITALS — BP 107/71 | HR 95 | Temp 97.9°F | Ht 67.0 in | Wt 208.0 lb

## 2020-01-15 DIAGNOSIS — Z9189 Other specified personal risk factors, not elsewhere classified: Secondary | ICD-10-CM

## 2020-01-15 DIAGNOSIS — Z6832 Body mass index (BMI) 32.0-32.9, adult: Secondary | ICD-10-CM

## 2020-01-15 DIAGNOSIS — E669 Obesity, unspecified: Secondary | ICD-10-CM

## 2020-01-15 DIAGNOSIS — E559 Vitamin D deficiency, unspecified: Secondary | ICD-10-CM | POA: Diagnosis not present

## 2020-01-15 DIAGNOSIS — E538 Deficiency of other specified B group vitamins: Secondary | ICD-10-CM

## 2020-01-15 DIAGNOSIS — E66811 Obesity, class 1: Secondary | ICD-10-CM

## 2020-01-15 DIAGNOSIS — Z20822 Contact with and (suspected) exposure to covid-19: Secondary | ICD-10-CM

## 2020-01-15 DIAGNOSIS — R632 Polyphagia: Secondary | ICD-10-CM | POA: Diagnosis not present

## 2020-01-15 DIAGNOSIS — R790 Abnormal level of blood mineral: Secondary | ICD-10-CM

## 2020-01-15 MED ORDER — PHENTERMINE HCL 37.5 MG PO TABS: 37.5000 mg | ORAL_TABLET | Freq: Every day | ORAL | 0 refills | Status: DC

## 2020-01-15 NOTE — Progress Notes (Signed)
Chief Complaint:   OBESITY Debra Hernandez is here to discuss her progress with her obesity treatment plan along with follow-up of her obesity related diagnoses.   Today's visit was #: 4 Starting weight: 220 lbs Starting date: 10/24/2019 Today's weight: 208 lbs Today's date: 01/15/2020 Total lbs lost to date: 12 lbs Body mass index is 32.58 kg/m.  Total weight loss percentage to date: -5.45%  Interim History: Debra Hernandez says that 1/2 tablet of phentermine caused increased hunger and more GI issues. Nutrition Plan: keeping a food journal and adhering to recommended goals of 1400-1500 calories and 95 grams of protein.  Anti-obesity medications: phentermine 18.75 mg daily for 2 weeks, and then 37.5 mg daily. Reported side effects: GI issues and increased hunger on 1/2 dose.[name] Activity: Cardio video for 30 minutes 3 times per week. Sleep: Number of hours slept each night: ~6..   Assessment/Plan:   1. Polyphagia Hyperphagia, also called polyphagia, refers to excessive feelings of hunger. This is more likely to be an issues for people that have diabetes, prediabetes, or insulin resistance. She will continue to focus on protein-rich, low simple carbohydrate foods. We reviewed the importance of hydration, regular exercise for stress reduction, and restorative sleep.  - CBC with Differential/Platelet - Comprehensive metabolic panel - Refill phentermine (ADIPEX-P) 37.5 MG tablet; Take 1 tablet (37.5 mg total) by mouth daily before breakfast.  Dispense: 30 tablet; Refill: 0  Having again reminded the patient of the "off label" use of Phentermine beyond three consecutive months, and again discussing the risks, benefits, contraindications, and limitations of it's use; given it's role in the successful treatment of obesity thus far and lack of adverse effect, patient has expressed desire and given informed verbal consent to continue use.   I have consulted the [place] Controlled Substances Registry for this  patient, and feel the risk/benefit ratio today is favorable for proceeding with this prescription for a controlled substance. The patient understands monitoring parameters and red flags.   2. B12 deficiency Debra Hernandez is taking vitamin B12 injections weekly.  Will check vitamin B12 level today.  - Vitamin B12  3. Vitamin D deficiency Not at goal. Current vitamin D is 16.6, tested on 10/24/2019. Optimal goal > 50 ng/dL.   Plan:  [x]   Continue Vitamin D @50 ,000 IU every week. []   Continue home supplement daily. [x]   Will check vitamin D level today.  - VITAMIN D 25 Hydroxy (Vit-D Deficiency, Fractures)  4. Low ferritin level Will check ferritin level today, as per below.  Lab Results  Component Value Date   FERRITIN 15 10/24/2019   - Ferritin  5. Exposure to COVID-19 virus Will check COVID antibody lab today.  We will continue to monitor. Orders and follow up as documented in patient record.  - SAR CoV2 Serology (COVID 19)AB(IGG)IA  6. At risk for constipation Debra Hernandez was given approximately 8 minutes of counseling today regarding prevention of constipation.   Getting to Good Bowel Health: Your goal is to have one soft bowel movement each day. Drink at least 8 glasses of water each day. Eat plenty of fiber (goal is over 30 grams each day). It is best to get most of your fiber from dietary sources which includes leafy green vegetables, fresh fruit, and whole grains. You may need to add fiber with the help of OTC fiber supplements. These include Metamucil, Citrucel, and Benefiber. If you are still having trouble, try adding Miralax. If all of these changes do not work, contact me for further  direction.  7. Class 1 obesity with serious comorbidity and body mass index (BMI) of 32.0 to 32.9 in adult, unspecified obesity type  Course: Debra Hernandez is currently in the action stage of change. As such, her goal is to continue with weight loss efforts.   Nutrition goals: She has agreed to keeping  a food journal and adhering to recommended goals of 1400-1500 calories and 95 grams of protein.   Exercise goals: For substantial health benefits, adults should do at least 150 minutes (2 hours and 30 minutes) a week of moderate-intensity, or 75 minutes (1 hour and 15 minutes) a week of vigorous-intensity aerobic physical activity, or an equivalent combination of moderate- and vigorous-intensity aerobic activity. Aerobic activity should be performed in episodes of at least 10 minutes, and preferably, it should be spread throughout the week.  Behavioral modification strategies: increasing lean protein intake, decreasing simple carbohydrates, increasing vegetables, increasing water intake and decreasing liquid calories.  Debra Hernandez has agreed to follow-up with our clinic in 4 weeks. She was informed of the importance of frequent follow-up visits to maximize her success with intensive lifestyle modifications for her multiple health conditions.   Debra Hernandez was informed we would discuss her lab results at her next visit unless there is a critical issue that needs to be addressed sooner. Debra Hernandez agreed to keep her next visit at the agreed upon time to discuss these results.  Objective:   Blood pressure 107/71, pulse 95, temperature 97.9 F (36.6 C), temperature source Oral, height 5\' 7"  (1.702 m), weight 208 lb (94.3 kg), last menstrual period 03/23/2013, SpO2 100 %. Body mass index is 32.58 kg/m.  General: Cooperative, alert, well developed, in no acute distress. HEENT: Conjunctivae and lids unremarkable. Cardiovascular: Regular rhythm.  Lungs: Normal work of breathing. Neurologic: No focal deficits.   Lab Results  Component Value Date   CREATININE 0.72 01/15/2020   BUN 9 01/15/2020   NA 140 01/15/2020   K 4.0 01/15/2020   CL 104 01/15/2020   CO2 21 01/15/2020   Lab Results  Component Value Date   ALT 9 01/15/2020   AST 11 01/15/2020   ALKPHOS 74 01/15/2020   BILITOT 0.3 01/15/2020   Lab  Results  Component Value Date   HGBA1C 5.3 08/29/2013   Lab Results  Component Value Date   TSH 2.160 06/28/2019   Lab Results  Component Value Date   CHOL 176 06/28/2019   HDL 50 06/28/2019   LDLCALC 103 (H) 06/28/2019   TRIG 131 06/28/2019   CHOLHDL 3.5 06/28/2019   Lab Results  Component Value Date   WBC 5.4 01/15/2020   HGB 13.1 01/15/2020   HCT 40.6 01/15/2020   MCV 95 01/15/2020   PLT 203 01/15/2020   Lab Results  Component Value Date   IRON 83 10/24/2019   TIBC 304 10/24/2019   FERRITIN 15 01/15/2020   Attestation Statements:   Reviewed by clinician on day of visit: allergies, medications, problem list, medical history, surgical history, family history, social history, and previous encounter notes.  I, Water quality scientist, CMA, am acting as transcriptionist for Briscoe Deutscher, DO  I have reviewed the above documentation for accuracy and completeness, and I agree with the above. Briscoe Deutscher, DO

## 2020-01-16 ENCOUNTER — Ambulatory Visit (INDEPENDENT_AMBULATORY_CARE_PROVIDER_SITE_OTHER): Payer: Managed Care, Other (non HMO) | Admitting: Family Medicine

## 2020-01-16 LAB — SARS-COV-2 SEMI-QUANTITATIVE TOTAL ANTIBODY, SPIKE
SARS-CoV-2 Semi-Quant Total Ab: <0.8 U/mL (ref <0.8)
SARS-CoV-2 Spike Ab Interp: NEGATIVE

## 2020-01-16 LAB — CBC WITH DIFFERENTIAL/PLATELET
Basophils Absolute: 0.1 10*3/uL (ref 0.0–0.2)
Basos: 1 %
EOS (ABSOLUTE): 0.1 10*3/uL (ref 0.0–0.4)
Eos: 3 %
Hematocrit: 40.6 % (ref 34.0–46.6)
Hemoglobin: 13.1 g/dL (ref 11.1–15.9)
Immature Grans (Abs): 0 10*3/uL (ref 0.0–0.1)
Immature Granulocytes: 0 %
Lymphocytes Absolute: 2 10*3/uL (ref 0.7–3.1)
Lymphs: 38 %
MCH: 30.7 pg (ref 26.6–33.0)
MCHC: 32.3 g/dL (ref 31.5–35.7)
MCV: 95 fL (ref 79–97)
Monocytes Absolute: 0.4 10*3/uL (ref 0.1–0.9)
Monocytes: 7 %
Neutrophils Absolute: 2.8 10*3/uL (ref 1.4–7.0)
Neutrophils: 51 %
Platelets: 203 10*3/uL (ref 150–450)
RBC: 4.27 x10E6/uL (ref 3.77–5.28)
RDW: 12.8 % (ref 11.7–15.4)
WBC: 5.4 10*3/uL (ref 3.4–10.8)

## 2020-01-16 LAB — COMPREHENSIVE METABOLIC PANEL
ALT: 9 IU/L (ref 0–32)
AST: 11 IU/L (ref 0–40)
Albumin/Globulin Ratio: 2 (ref 1.2–2.2)
Albumin: 4.5 g/dL (ref 3.8–4.8)
Alkaline Phosphatase: 74 IU/L (ref 44–121)
BUN/Creatinine Ratio: 13 (ref 9–23)
BUN: 9 mg/dL (ref 6–20)
Bilirubin Total: 0.3 mg/dL (ref 0.0–1.2)
CO2: 21 mmol/L (ref 20–29)
Calcium: 9.4 mg/dL (ref 8.7–10.2)
Chloride: 104 mmol/L (ref 96–106)
Creatinine, Ser: 0.72 mg/dL (ref 0.57–1.00)
GFR calc Af Amer: 127 mL/min/{1.73_m2} (ref >59)
GFR calc non Af Amer: 110 mL/min/{1.73_m2} (ref >59)
Globulin, Total: 2.2 g/dL (ref 1.5–4.5)
Glucose: 85 mg/dL (ref 65–99)
Potassium: 4 mmol/L (ref 3.5–5.2)
Sodium: 140 mmol/L (ref 134–144)
Total Protein: 6.7 g/dL (ref 6.0–8.5)

## 2020-01-16 LAB — VITAMIN D 25 HYDROXY (VIT D DEFICIENCY, FRACTURES): Vit D, 25-Hydroxy: 19.3 ng/mL — ABNORMAL LOW (ref 30.0–100.0)

## 2020-01-16 LAB — FERRITIN: Ferritin: 15 ng/mL (ref 15–150)

## 2020-01-16 LAB — VITAMIN B12: Vitamin B-12: 152 pg/mL — ABNORMAL LOW (ref 232–1245)

## 2020-02-11 ENCOUNTER — Encounter (INDEPENDENT_AMBULATORY_CARE_PROVIDER_SITE_OTHER): Payer: Self-pay | Admitting: Family Medicine

## 2020-02-11 ENCOUNTER — Other Ambulatory Visit: Payer: Self-pay

## 2020-02-11 ENCOUNTER — Telehealth (INDEPENDENT_AMBULATORY_CARE_PROVIDER_SITE_OTHER): Payer: Managed Care, Other (non HMO) | Admitting: Family Medicine

## 2020-02-11 DIAGNOSIS — Z8616 Personal history of COVID-19: Secondary | ICD-10-CM | POA: Diagnosis not present

## 2020-02-11 DIAGNOSIS — R632 Polyphagia: Secondary | ICD-10-CM

## 2020-02-11 DIAGNOSIS — E538 Deficiency of other specified B group vitamins: Secondary | ICD-10-CM

## 2020-02-11 DIAGNOSIS — E669 Obesity, unspecified: Secondary | ICD-10-CM

## 2020-02-11 DIAGNOSIS — E559 Vitamin D deficiency, unspecified: Secondary | ICD-10-CM | POA: Diagnosis not present

## 2020-02-11 DIAGNOSIS — R790 Abnormal level of blood mineral: Secondary | ICD-10-CM | POA: Diagnosis not present

## 2020-02-11 DIAGNOSIS — Z6832 Body mass index (BMI) 32.0-32.9, adult: Secondary | ICD-10-CM

## 2020-02-11 MED ORDER — PHENTERMINE HCL 37.5 MG PO TABS: 37.5000 mg | ORAL_TABLET | Freq: Every day | ORAL | 0 refills | Status: DC

## 2020-02-11 MED ORDER — "SYRINGE/NEEDLE (DISP) 25G X 1"" 3 ML MISC": 0 refills | Status: DC

## 2020-02-11 MED ORDER — VITAMIN D (ERGOCALCIFEROL) 1.25 MG (50000 UNIT) PO CAPS: ORAL_CAPSULE | ORAL | 0 refills | Status: DC

## 2020-02-11 MED ORDER — CYANOCOBALAMIN 1000 MCG/ML IJ SOLN: INTRAMUSCULAR | 0 refills | Status: DC

## 2020-02-13 NOTE — Progress Notes (Signed)
TeleHealth Visit:  Due to the COVID-19 pandemic, this visit was completed with telemedicine (audio/video) technology to reduce patient and provider exposure as well as to preserve personal protective equipment.   Debra Hernandez has verbally consented to this TeleHealth visit. The patient is located at home, the provider is located at the [facility] office. The participants in this visit include the listed provider and patient and. The visit was conducted today via [place].  OBESITY Debra Hernandez is here to discuss her progress with her obesity treatment plan along with follow-up of her obesity related diagnoses.   Today's visit was #: 5 Starting weight: 220 lbs Starting date: 10/24/2019 Today's weight: 203 lbs Today's date: 02/11/2020 Total lbs lost to date: 17 lbs Total weight loss percentage to date: -7.73%  Interim History: She has been weighing her self 1-2 times per week.  She had COVID a few weeks ago and had a decreased appetite. Nutrition Plan: keeping a food journal and adhering to recommended goals of 1400-1500 calories and 95 grams of protein. Anti-obesity medications: phentermine 37.5 mg daily. Reported side effects: None. Activity: Workout video for 30 minutes 5 times per week.  Assessment/Plan:   1. Low ferritin level [name]'s ferritin level was 15 on 01/15/2020.  She is taking a prenatal vitamin with iron.  Lab Results  Component Value Date   FERRITIN 15 01/15/2020   2. History of COVID-19 Improving.  We will continue to monitor symptoms as they relate to her weight loss journey.  3. Vitamin D deficiency Not at goal. Current vitamin D is 19.3, tested on 01/15/2020. Optimal goal > 50 ng/dL.   Plan: Continue to take prescription Vitamin D @50 ,000 IU every week as prescribed.  Follow-up for routine testing of Vitamin D, at least 2-3 times per year to avoid over-replacement.  - Refill Vitamin D, Ergocalciferol, (DRISDOL) 1.25 MG (50000 UNIT) CAPS capsule; TAKE  1 CAPSULE BY MOUTH EVERY 7 DAYS  Dispense: 12 capsule; Refill: 0  4. B12 deficiency Lab Results  Component Value Date   VITAMINB12 152 (L) 01/15/2020   Supplementation: vitamin B12 1000 mcg/mL IM weekly for 4 weeks, then once montly.  Will refill today, as per below.  - Refill cyanocobalamin (,VITAMIN B-12,) 1000 MCG/ML injection; Inject weekly x 4 weeks, then every 30 days after  Dispense: 10 mL; Refill: 0 - SYRINGE-NEEDLE, DISP, 3 ML 25G X 1" 3 ML MISC; Use to give b12  Dispense: 100 each; Refill: 0  5. Polyphagia Controlled. Current treatment: phentermine 37.5 mg daily. Polyphagia refers to excessive feelings of hunger. She will continue to focus on protein-rich, low simple carbohydrate foods.   Plan:  We reviewed the importance of hydration, regular exercise for stress reduction, and restorative sleep.  Will refill phentermine today, as per below.  - Refill phentermine (ADIPEX-P) 37.5 MG tablet; Take 1 tablet (37.5 mg total) by mouth daily before breakfast.  Dispense: 30 tablet; Refill: 0  Having again reminded the patient of the "off label" use of Phentermine beyond three consecutive months, and again discussing the risks, benefits, contraindications, and limitations of it's use; given it's role in the successful treatment of obesity thus far and lack of adverse effect, patient has expressed desire and given informed verbal consent to continue use.   I have consulted the [place] Controlled Substances Registry for this patient, and feel the risk/benefit ratio today is favorable for proceeding with this prescription for a controlled substance. The patient understands monitoring parameters and red flags.   6.  Class 1 obesity with serious comorbidity and body mass index (BMI) of 32.0 to 32.9 in adult, unspecified obesity type  Course: Debra Hernandez is currently in the action stage of change. As such, her goal is to continue with weight loss efforts.   Nutrition goals: She has agreed to keeping a  food journal and adhering to recommended goals of 1400-1500 calories and 95 grams of protein.   Exercise goals: For substantial health benefits, adults should do at least 150 minutes (2 hours and 30 minutes) a week of moderate-intensity, or 75 minutes (1 hour and 15 minutes) a week of vigorous-intensity aerobic physical activity, or an equivalent combination of moderate- and vigorous-intensity aerobic activity. Aerobic activity should be performed in episodes of at least 10 minutes, and preferably, it should be spread throughout the week.  Behavioral modification strategies: increasing lean protein intake, decreasing simple carbohydrates, increasing vegetables and increasing water intake.  Gladyce has agreed to follow-up with our clinic in 4 weeks. She was informed of the importance of frequent follow-up visits to maximize her success with intensive lifestyle modifications for her multiple health conditions.   Objective:   Last menstrual period 03/23/2013.  General: Cooperative, alert, well developed, in no acute distress. HEENT: Conjunctivae and lids unremarkable. Cardiovascular: Regular rhythm.  Lungs: Normal work of breathing. Neurologic: No focal deficits.   Lab Results  Component Value Date   CREATININE 0.72 01/15/2020   BUN 9 01/15/2020   NA 140 01/15/2020   K 4.0 01/15/2020   CL 104 01/15/2020   CO2 21 01/15/2020   Lab Results  Component Value Date   ALT 9 01/15/2020   AST 11 01/15/2020   ALKPHOS 74 01/15/2020   BILITOT 0.3 01/15/2020   Lab Results  Component Value Date   HGBA1C 5.3 08/29/2013   Lab Results  Component Value Date   TSH 2.160 06/28/2019   Lab Results  Component Value Date   CHOL 176 06/28/2019   HDL 50 06/28/2019   LDLCALC 103 (H) 06/28/2019   TRIG 131 06/28/2019   CHOLHDL 3.5 06/28/2019   Lab Results  Component Value Date   WBC 5.4 01/15/2020   HGB 13.1 01/15/2020   HCT 40.6 01/15/2020   MCV 95 01/15/2020   PLT 203 01/15/2020   Lab  Results  Component Value Date   IRON 83 10/24/2019   TIBC 304 10/24/2019   FERRITIN 15 01/15/2020   Attestation Statements:   Reviewed by clinician on day of visit: allergies, medications, problem list, medical history, surgical history, family history, social history, and previous encounter notes.  I, Water quality scientist, CMA, am acting as transcriptionist for Briscoe Deutscher, DO  I have reviewed the above documentation for accuracy and completeness, and I agree with the above. Briscoe Deutscher, DO

## 2020-03-12 ENCOUNTER — Encounter (INDEPENDENT_AMBULATORY_CARE_PROVIDER_SITE_OTHER): Payer: Self-pay | Admitting: Family Medicine

## 2020-03-12 ENCOUNTER — Other Ambulatory Visit: Payer: Self-pay

## 2020-03-12 ENCOUNTER — Ambulatory Visit (INDEPENDENT_AMBULATORY_CARE_PROVIDER_SITE_OTHER): Payer: Managed Care, Other (non HMO) | Admitting: Family Medicine

## 2020-03-12 VITALS — BP 114/73 | HR 76 | Temp 98.0°F | Ht 67.0 in | Wt 202.0 lb

## 2020-03-12 DIAGNOSIS — E538 Deficiency of other specified B group vitamins: Secondary | ICD-10-CM

## 2020-03-12 DIAGNOSIS — E669 Obesity, unspecified: Secondary | ICD-10-CM

## 2020-03-12 DIAGNOSIS — R632 Polyphagia: Secondary | ICD-10-CM | POA: Diagnosis not present

## 2020-03-12 DIAGNOSIS — E559 Vitamin D deficiency, unspecified: Secondary | ICD-10-CM

## 2020-03-12 DIAGNOSIS — R790 Abnormal level of blood mineral: Secondary | ICD-10-CM | POA: Diagnosis not present

## 2020-03-12 DIAGNOSIS — Z9189 Other specified personal risk factors, not elsewhere classified: Secondary | ICD-10-CM | POA: Diagnosis not present

## 2020-03-12 DIAGNOSIS — L650 Telogen effluvium: Secondary | ICD-10-CM

## 2020-03-12 DIAGNOSIS — Z6831 Body mass index (BMI) 31.0-31.9, adult: Secondary | ICD-10-CM

## 2020-03-13 DIAGNOSIS — L650 Telogen effluvium: Secondary | ICD-10-CM | POA: Insufficient documentation

## 2020-03-13 DIAGNOSIS — E559 Vitamin D deficiency, unspecified: Secondary | ICD-10-CM | POA: Insufficient documentation

## 2020-03-13 DIAGNOSIS — R790 Abnormal level of blood mineral: Secondary | ICD-10-CM | POA: Insufficient documentation

## 2020-03-13 DIAGNOSIS — R632 Polyphagia: Secondary | ICD-10-CM | POA: Insufficient documentation

## 2020-03-13 DIAGNOSIS — E538 Deficiency of other specified B group vitamins: Secondary | ICD-10-CM | POA: Insufficient documentation

## 2020-03-13 MED ORDER — PHENTERMINE HCL 37.5 MG PO TABS: 37.5000 mg | ORAL_TABLET | Freq: Every day | ORAL | 0 refills | Status: DC

## 2020-03-18 NOTE — Progress Notes (Signed)
Chief Complaint:   OBESITY Debra Hernandez is here to discuss her progress with her obesity treatment plan along with follow-up of her obesity related diagnoses.   Today's visit was #: 6 Starting weight: 220 lbs Starting date: 10/24/2019 Today's weight: 202 lbs Today's date: 03/12/2020 Total lbs lost to date: 18 lbs Body mass index is 31.64 kg/m.  Total weight loss percentage to date: -8.18%  Interim History:  Debra Hernandez says she has recovered from [place] except for increased hair loss and increased headaches.  She has been exercising more and making healthy choices, she states.  Current Meal Plan: keeping a food journal and adhering to recommended goals of 1300-1500 calories and 95 grams of protein for 95% of the time.  Current Exercise Plan: Cardio/stretch/strength for 30 minutes 5 times per week. Current Anti-Obesity Medications: phentermine 37.5 mg daily. Side effects: None.  Assessment/Plan:   1. Polyphagia Current treatment: phentermine 37.5 mg daily. Polyphagia refers to excessive feelings of hunger. She will continue to focus on protein-rich, low simple carbohydrate foods. We reviewed the importance of hydration, regular exercise for stress reduction, and restorative sleep.  - Refill phentermine (ADIPEX-P) 37.5 MG tablet; Take 1 tablet (37.5 mg total) by mouth daily before breakfast.  Dispense: 30 tablet; Refill: 0  Having again reminded the patient of the "off label" use of Phentermine beyond three consecutive months, and again discussing the risks, benefits, contraindications, and limitations of it's use; given it's role in the successful treatment of obesity thus far and lack of adverse effect, patient has expressed desire and given informed verbal consent to continue use.   I have consulted the [place] Controlled Substances Registry for this patient, and feel the risk/benefit ratio today is favorable for proceeding with this prescription for a controlled substance. The patient  understands monitoring parameters and red flags.   2. Low ferritin level, Rx PNV with iron Debra Hernandez is taking a daily prenatal vitamin with iron.  Nutrition: Iron-rich foods include dark leafy greens, red and white meats, eggs, seafood, and beans.  Certain foods and drinks prevent your body from absorbing iron properly. Avoid eating these foods in the same meal as iron-rich foods or with iron supplements. These foods include: coffee, black tea, and red wine; milk, dairy products, and foods that are high in calcium; beans and soybeans; whole grains. Constipation can be a side effect of iron supplementation. Increased water and fiber intake are helpful. Water goal: > 2 liters/day. Fiber goal: > 25 grams/day.  3. Vitamin D deficiency, Rx vitamin D 50K IU weekly Not at goal. Current vitamin D is 19.3, tested on 01/15/2020. Optimal goal > 50 ng/dL.  She is taking vitamin D 50,000 IU weekly.  Plan: Continue to take prescription Vitamin D @50 ,000 IU every week as prescribed.  Follow-up for routine testing of Vitamin D, at least 2-3 times per year to avoid over-replacement.  4. B12 deficiency, Rx IM B12 weekly Lab Results  Component Value Date   VITAMINB12 152 (L) 01/15/2020   Supplementation:  Vitamin B12 1000 mcg/mL weekly.   Plan:  Continue vitamin B12 injections.   5. Telogen effluvium, s/p COVID Hair loss associated with weight loss is usually Telogen Effluvium. Fluctuation in BMI causes physical stress, which signals the hair follicles to move into an inactive stage.   Plan: Focus on adequate protein intake, treat and monitor any nutritional deficiencies, and consider adding more calories to meal plan. Will continue to monitor symptoms as they relate to her weight loss journey.  6. At risk for malnutrition Debra Hernandez was given extensive malnutrition prevention education and counseling today of more than 9 minutes.  Counseled her that malnutrition refers to inappropriate nutrients or not the right  balance of nutrients for optimal health.  Discussed with Debra Hernandez that it is absolutely possible to be malnourished but yet obese.  Risk factors, including but not limited to, inappropriate dietary choices, difficulty with obtaining food due to physical or financial limitations, and various physical and mental health conditions were reviewed with Debra Hernandez.   7. Class 1 obesity with serious comorbidity and body mass index (BMI) of 31.0 to 31.9 in adult, unspecified obesity type  Course: Debra Hernandez is currently in the action stage of change. As such, her goal is to continue with weight loss efforts.   Nutrition goals: She has agreed to keeping a food journal and adhering to recommended goals of 1300-1500 calories and 95 grams of protein.   Exercise goals: For substantial health benefits, adults should do at least 150 minutes (2 hours and 30 minutes) a week of moderate-intensity, or 75 minutes (1 hour and 15 minutes) a week of vigorous-intensity aerobic physical activity, or an equivalent combination of moderate- and vigorous-intensity aerobic activity. Aerobic activity should be performed in episodes of at least 10 minutes, and preferably, it should be spread throughout the week.  Behavioral modification strategies: increasing lean protein intake, decreasing simple carbohydrates, increasing vegetables and increasing water intake.  Debra Hernandez has agreed to follow-up with our clinic in 4-6 weeks. She was informed of the importance of frequent follow-up visits to maximize her success with intensive lifestyle modifications for her multiple health conditions.   Objective:   Blood pressure 114/73, pulse 76, temperature 98 F (36.7 C), temperature source Oral, height 5\' 7"  (1.702 m), weight 202 lb (91.6 kg), last menstrual period 03/23/2013, SpO2 99 %. Body mass index is 31.64 kg/m.  General: Cooperative, alert, well developed, in no acute distress. HEENT: Conjunctivae and lids  unremarkable. Cardiovascular: Regular rhythm.  Lungs: Normal work of breathing. Neurologic: No focal deficits.   Lab Results  Component Value Date   CREATININE 0.72 01/15/2020   BUN 9 01/15/2020   NA 140 01/15/2020   K 4.0 01/15/2020   CL 104 01/15/2020   CO2 21 01/15/2020   Lab Results  Component Value Date   ALT 9 01/15/2020   AST 11 01/15/2020   ALKPHOS 74 01/15/2020   BILITOT 0.3 01/15/2020   Lab Results  Component Value Date   HGBA1C 5.3 08/29/2013   Lab Results  Component Value Date   TSH 2.160 06/28/2019   Lab Results  Component Value Date   CHOL 176 06/28/2019   HDL 50 06/28/2019   LDLCALC 103 (H) 06/28/2019   TRIG 131 06/28/2019   CHOLHDL 3.5 06/28/2019   Lab Results  Component Value Date   WBC 5.4 01/15/2020   HGB 13.1 01/15/2020   HCT 40.6 01/15/2020   MCV 95 01/15/2020   PLT 203 01/15/2020   Lab Results  Component Value Date   IRON 83 10/24/2019   TIBC 304 10/24/2019   FERRITIN 15 01/15/2020   Attestation Statements:   Reviewed by clinician on day of visit: allergies, medications, problem list, medical history, surgical history, family history, social history, and previous encounter notes.  I, Water quality scientist, CMA, am acting as transcriptionist for Briscoe Deutscher, DO  I have reviewed the above documentation for accuracy and completeness, and I agree with the above. Briscoe Deutscher, DO

## 2020-03-19 ENCOUNTER — Ambulatory Visit: Payer: Managed Care, Other (non HMO) | Admitting: Dermatology

## 2020-03-26 ENCOUNTER — Encounter (INDEPENDENT_AMBULATORY_CARE_PROVIDER_SITE_OTHER): Payer: Self-pay | Admitting: Family Medicine

## 2020-03-26 DIAGNOSIS — R632 Polyphagia: Secondary | ICD-10-CM

## 2020-03-27 MED ORDER — PHENTERMINE HCL 37.5 MG PO TABS: 37.5000 mg | ORAL_TABLET | Freq: Every day | ORAL | 0 refills | Status: DC

## 2020-04-24 ENCOUNTER — Ambulatory Visit (INDEPENDENT_AMBULATORY_CARE_PROVIDER_SITE_OTHER): Payer: Managed Care, Other (non HMO) | Admitting: Family Medicine

## 2020-05-10 ENCOUNTER — Other Ambulatory Visit: Payer: Self-pay

## 2020-05-10 ENCOUNTER — Emergency Department: Payer: Managed Care, Other (non HMO)

## 2020-05-10 ENCOUNTER — Encounter: Payer: Self-pay | Admitting: Emergency Medicine

## 2020-05-10 ENCOUNTER — Emergency Department
Admission: EM | Admit: 2020-05-10 | Discharge: 2020-05-10 | Disposition: A | Discharge status: Home / Self Care | Payer: Managed Care, Other (non HMO) | Attending: Emergency Medicine | Admitting: Emergency Medicine

## 2020-05-10 DIAGNOSIS — N200 Calculus of kidney: Secondary | ICD-10-CM | POA: Insufficient documentation

## 2020-05-10 DIAGNOSIS — R109 Unspecified abdominal pain: Secondary | ICD-10-CM

## 2020-05-10 DIAGNOSIS — Z85828 Personal history of other malignant neoplasm of skin: Secondary | ICD-10-CM | POA: Insufficient documentation

## 2020-05-10 DIAGNOSIS — N390 Urinary tract infection, site not specified: Secondary | ICD-10-CM | POA: Insufficient documentation

## 2020-05-10 DIAGNOSIS — B9689 Other specified bacterial agents as the cause of diseases classified elsewhere: Secondary | ICD-10-CM | POA: Diagnosis not present

## 2020-05-10 DIAGNOSIS — Z7982 Long term (current) use of aspirin: Secondary | ICD-10-CM | POA: Diagnosis not present

## 2020-05-10 LAB — URINALYSIS, COMPLETE (UACMP) WITH MICROSCOPIC
Bilirubin Urine: NEGATIVE
Glucose, UA: NEGATIVE mg/dL
Ketones, ur: NEGATIVE mg/dL
Nitrite: NEGATIVE
Protein, ur: 100 mg/dL — AB
Specific Gravity, Urine: 1.016 (ref 1.005–1.030)
WBC, UA: >50 WBC/hpf — ABNORMAL HIGH (ref 0–5)
pH: 6 (ref 5.0–8.0)

## 2020-05-10 LAB — CBC
HCT: 36.7 % (ref 36.0–46.0)
Hemoglobin: 12.3 g/dL (ref 12.0–15.0)
MCH: 31.8 pg (ref 26.0–34.0)
MCHC: 33.5 g/dL (ref 30.0–36.0)
MCV: 94.8 fL (ref 80.0–100.0)
Platelets: 214 10*3/uL (ref 150–400)
RBC: 3.87 MIL/uL (ref 3.87–5.11)
RDW: 11.9 % (ref 11.5–15.5)
WBC: 8.8 10*3/uL (ref 4.0–10.5)
nRBC: 0 % (ref 0.0–0.2)

## 2020-05-10 LAB — BASIC METABOLIC PANEL
Anion gap: 6 (ref 5–15)
BUN: 13 mg/dL (ref 6–20)
CO2: 26 mmol/L (ref 22–32)
Calcium: 8.8 mg/dL — ABNORMAL LOW (ref 8.9–10.3)
Chloride: 105 mmol/L (ref 98–111)
Creatinine, Ser: 0.61 mg/dL (ref 0.44–1.00)
GFR, Estimated: >60 mL/min (ref >60)
Glucose, Bld: 102 mg/dL — ABNORMAL HIGH (ref 70–99)
Potassium: 3.8 mmol/L (ref 3.5–5.1)
Sodium: 137 mmol/L (ref 135–145)

## 2020-05-10 LAB — LIPASE, BLOOD: Lipase: 25 U/L (ref 11–51)

## 2020-05-10 LAB — HEPATIC FUNCTION PANEL
ALT: 10 U/L (ref 0–44)
AST: 16 U/L (ref 15–41)
Albumin: 4 g/dL (ref 3.5–5.0)
Alkaline Phosphatase: 59 U/L (ref 38–126)
Bilirubin, Direct: <0.1 mg/dL (ref 0.0–0.2)
Total Bilirubin: 0.7 mg/dL (ref 0.3–1.2)
Total Protein: 7 g/dL (ref 6.5–8.1)

## 2020-05-10 MED ORDER — CEPHALEXIN 500 MG PO CAPS: 500.0000 mg | ORAL_CAPSULE | Freq: Two times a day (BID) | ORAL | 0 refills | Status: AC

## 2020-05-10 MED ORDER — HYDROMORPHONE HCL 1 MG/ML IJ SOLN
0.5000 mg | INTRAMUSCULAR | Status: AC
  Administered 2020-05-10: 0.5 mg via INTRAVENOUS
  Filled 2020-05-10: qty 1

## 2020-05-10 MED ORDER — ONDANSETRON HCL 4 MG/2ML IJ SOLN
4.0000 mg | INTRAMUSCULAR | Status: AC
  Administered 2020-05-10: 4 mg via INTRAVENOUS
  Filled 2020-05-10: qty 2

## 2020-05-10 MED ORDER — HYDROMORPHONE HCL 1 MG/ML IJ SOLN
1.0000 mg | INTRAMUSCULAR | Status: AC
  Administered 2020-05-10: 1 mg via INTRAVENOUS
  Filled 2020-05-10: qty 1

## 2020-05-10 MED ORDER — SODIUM CHLORIDE 0.9 % IV SOLN
1.0000 g | Freq: Once | INTRAVENOUS | Status: AC
  Administered 2020-05-10: 1 g via INTRAVENOUS
  Filled 2020-05-10: qty 10

## 2020-05-10 MED ORDER — SODIUM CHLORIDE 0.9 % IV BOLUS
1000.0000 mL | Freq: Once | INTRAVENOUS | Status: AC
  Administered 2020-05-10: 1000 mL via INTRAVENOUS

## 2020-05-10 MED ORDER — KETOROLAC TROMETHAMINE 30 MG/ML IJ SOLN
30.0000 mg | Freq: Once | INTRAMUSCULAR | Status: AC
  Administered 2020-05-10: 30 mg via INTRAVENOUS
  Filled 2020-05-10: qty 1

## 2020-05-10 MED ORDER — HYDROCODONE-ACETAMINOPHEN 5-325 MG PO TABS: 1.0000 | ORAL_TABLET | Freq: Four times a day (QID) | ORAL | 0 refills | Status: DC | PRN

## 2020-05-10 MED ORDER — ONDANSETRON 4 MG PO TBDP: 4.0000 mg | ORAL_TABLET | Freq: Four times a day (QID) | ORAL | 0 refills | Status: DC | PRN

## 2020-05-10 MED ORDER — ONDANSETRON HCL 4 MG/2ML IJ SOLN
4.0000 mg | Freq: Once | INTRAMUSCULAR | Status: AC
  Administered 2020-05-10: 4 mg via INTRAVENOUS
  Filled 2020-05-10: qty 2

## 2020-05-10 NOTE — ED Notes (Signed)
Pt taken to CT.

## 2020-05-10 NOTE — ED Triage Notes (Signed)
Pt reports sudden onset of right flank pain that radiates into her right abd and back. Pt tearful and concerned she may have a kidney stone

## 2020-05-10 NOTE — ED Provider Notes (Signed)
[facility] Emergency Department Provider Note   ____________________________________________   Event Date/Time   First MD Initiated Contact with Patient 05/10/20 [phone]     (approximate)  I have reviewed the triage vital signs and the nursing notes.   HISTORY  Chief Complaint Flank Pain    HPI Debra Hernandez is a 35 y.o. female sudden onset of severe right flank pain at approximately 5 AM  Patient reports a severe 10 out of 10 sharp pain in her right flank.  Is been present suddenly at about 5 in the morning.  Associated with nausea but no active vomiting.  No vaginal bleeding or discharge.  Reports it came about suddenly.  Has been feeling well otherwise.  No fever.  Reports it feels like it could be a kidney stone which she has had 1 in the distant past that was similar in nature.  Additionally has a history of ovarian cysts, but reports this really feels like it is in her back and right lower flank   Past Medical History:  Diagnosis Date  . Allergies   . Anemia    H/O. no current issues  . Anxiety   . Blood transfusion 5/11   2 units bld transfused at [facility]  . BRCA negative 2015  . Depression   . Eczema   . History of seasonal allergies   . IBS (irritable bowel syndrome)   . Kidney problem   . Kidney stone    passsed stone - no surgery required  . Melanoma ([place]) 2003   on back - used local to removed  . Polycystic ovary syndrome   . PONV (postoperative nausea and vomiting)   . Swallowing difficulty   . Wears contact lenses     Patient Active Problem List   Diagnosis Date Noted  . Polyphagia 03/13/2020  . Low ferritin level, Rx PNV with iron 03/13/2020  . Vitamin D deficiency, Rx vitamin D 50K IU weekly 03/13/2020  . B12 deficiency, Rx IM B12 weekly 03/13/2020  . 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 Anal fissure 09/02/2016  . Anal skin tag 04/29/2016  . Hematochezia   . Change in bowel habits   . Anxiety  04/26/2014  . Clinical depression 04/26/2014  . H/O renal calculi 04/26/2014  . History of melanoma 04/26/2014  . Allergic rhinitis, seasonal 04/26/2014  . Significan Family History of Ovarian Cancer 05/19/2012  . Chronic Pelvic Pain 05/19/2012  . Polycystic ovary syndrome   . Abnormal weight gain 02/02/2007    Past Surgical History:  Procedure Laterality Date  . ABDOMINAL HYSTERECTOMY  2015  . ABDOMINOPLASTY    . CESAREAN SECTION  05/2009   at 39 wks at [facility],  lived only 12 days  . CESAREAN SECTION  02/02/2011   Procedure: CESAREAN SECTION;  Surgeon: Donnamae Jude, MD;  Location: [facility];  Service: Gynecology;  Laterality: N/A;  Repeart c/section  . COLONOSCOPY WITH PROPOFOL N/A 12/20/2014   Procedure: COLONOSCOPY WITH PROPOFOL;  Surgeon: Lucilla Lame, MD;  Location: [place];  Service: Endoscopy;  Laterality: N/A;  . DIAGNOSTIC LAPAROSCOPY     ovarian cyst removed  . LAPAROSCOPIC BILATERAL SALPINGECTOMY Bilateral 07/20/2012   Procedure: LAPAROSCOPIC BILATERAL SALPINGECTOMY;  Surgeon: Osborne Oman, MD;  Location: [facility];  Service: Gynecology;  Laterality: Bilateral;  . LAPAROSCOPIC HYSTERECTOMY Left 04/18/2013   Procedure: HYSTERECTOMY TOTAL LAPAROSCOPIC and  left salpingectomy;  Surgeon: Donnamae Jude, MD;  Location: [facility];  Service:  Gynecology;  Laterality: Left;  . laporascopy    . NOVASURE ABLATION N/A 07/20/2012   Procedure: NOVASURE ABLATION;  Surgeon: Osborne Oman, MD;  Location: [facility];  Service: Gynecology;  Laterality: N/A;  . OVARIAN CYST REMOVAL    . SCAR REVISION  02/02/2011   Procedure: SCAR REVISION;  Surgeon: Donnamae Jude, MD;  Location: [facility];  Service: Gynecology;  Laterality: N/A;  . SVD  04/2006   x 1 - at [facility]  . TONSILLECTOMY    . TONSILLECTOMY AND ADENOIDECTOMY    . WISDOM TOOTH EXTRACTION     2 lower extracted    Prior to Admission medications   Medication Sig Start Date End Date Taking? Authorizing  Provider  cephALEXin (KEFLEX) 500 MG capsule Take 1 capsule (500 mg total) by mouth 2 (two) times daily for 7 days. 05/10/20 05/17/20 Yes Delman Kitten, MD  HYDROcodone-acetaminophen (NORCO/VICODIN) 5-325 MG tablet Take 1-2 tablets by mouth every 6 (six) hours as needed for moderate pain. 05/10/20  Yes Delman Kitten, MD  ondansetron (ZOFRAN ODT) 4 MG disintegrating tablet Take 1 tablet (4 mg total) by mouth every 6 (six) hours as needed for nausea or vomiting. 05/10/20  Yes Delman Kitten, MD  Aspirin-Acetaminophen-Caffeine (GOODY HEADACHE PO) Take by mouth as needed.    [provider]  BIOTIN PO Take by mouth.    [provider]  COLLAGEN PO Take by mouth.    [provider]  cyanocobalamin (,VITAMIN B-12,) 1000 MCG/ML injection Inject weekly x 4 weeks, then every 30 days after 02/11/20   Briscoe Deutscher, DO  levocetirizine (XYZAL) 5 MG tablet Take 5 mg by mouth every evening.    [provider]  mometasone (ELOCON) 0.1 % cream Apply twice daily to hands as needed for eczema flares. Avoid applying to face, groin, and axilla. Use as directed. Risk of skin atrophy with long-term use reviewed. Patient not taking: Reported on 03/13/2020 12/06/19   Laurence Ferrari, [place], MD  phentermine (ADIPEX-P) 37.5 MG tablet Take 1 tablet (37.5 mg total) by mouth daily before breakfast. 03/27/20   Briscoe Deutscher, DO  polyvinyl alcohol (LIQUIFILM TEARS) 1.4 % ophthalmic solution Place 1 drop into both eyes as needed for dry eyes. Reported on 06/18/2015 Patient not taking: Reported on 03/13/2020    [provider]  SYRINGE-NEEDLE, DISP, 3 ML 25G X 1" 3 ML MISC Use to give b12 02/11/20   Briscoe Deutscher, DO  valACYclovir (VALTREX) 1000 MG tablet TAKE 2 TABLETS BY MOUTH TWICE A DAY FOR 1 DAY AT ONSET OF COLD SORE. MAY REPEAT AS NECESSARY Patient not taking: Reported on 03/13/2020 04/07/18   Mar Daring, PA-C  valACYclovir (VALTREX) 500 MG tablet Take 576m twice daily increasing to 1002mtwice daily  for 3 days with flares. Patient not taking: Reported on 03/13/2020 12/06/19   MoLaurence FerrariVi[place]MD  Vitamin D, Ergocalciferol, (DRISDOL) 1.25 MG (50000 UNIT) CAPS capsule TAKE 1 CAPSULE BY MOUTH EVERY 7 DAYS 02/11/20   WaBriscoe DeutscherDO    Allergies Atomoxetine, Monistat [miconazole], and Strattera [atomoxetine hcl]  Family History  Problem Relation Age of Onset  . Cancer Mother [id number]     Ovarian  . Parkinson's disease Mother   . Mental illness Mother   . Depression Mother   . Thyroid disease Mother   . Anxiety disorder Mother   . Cancer Maternal Grandmother        Ovarian  . Parkinson's disease Maternal Grandmother   . Diabetes  Maternal Grandfather   . Cancer Father 18       prostate  . Hypertension Father   . Hyperlipidemia Father   . Cancer Paternal Aunt 66       Breast  . Anesthesia problems Neg Hx   . Hypotension Neg Hx   . Malignant hyperthermia Neg Hx   . Pseudochol deficiency Neg Hx     Social History Social History   Tobacco Use  . Smoking status: Never Smoker  . Smokeless tobacco: Never Used  Substance Use Topics  . Alcohol use: Yes    Comment: occasionally  . Drug use: No    Review of Systems Constitutional: No fever/chills Eyes: No visual changes. Cardiovascular: Denies chest pain. Respiratory: Denies shortness of breath. Gastrointestinal: See HPI Genitourinary: Negative for dysuria.  Severe urgency urinate. Musculoskeletal: Negative for back pain except in right flank. Gynecologic: Denies any vaginal bleeding or discharge.  Previous hysterectomy Skin: Negative for rash. Neurological: Negative for headaches, areas of focal weakness or numbness.    ____________________________________________   PHYSICAL EXAM:  VITAL SIGNS: ED Triage Vitals  Enc Vitals Group     BP 05/10/20 0824 127/89     Pulse Rate 05/10/20 0824 (!) 118     Resp 05/10/20 0824 20     Temp 05/10/20 0824 98.2 F (36.8 C)     Temp Source 05/10/20 0824 Oral     SpO2 05/10/20  0824 98 %     Weight 05/10/20 0824 196 lb (88.9 kg)     Height 05/10/20 0824 5' 7"  (1.702 m)     Head Circumference --      Peak Flow --      Pain Score 05/10/20 0820 8     Pain Loc --      Pain Edu? --      Excl. in [place]? --     Constitutional: Alert and oriented.  Laying on her side, cannot seem to get comfortable, appears in significant pain in extremis. Eyes: Conjunctivae are normal. Head: Atraumatic. Nose: No congestion/rhinnorhea. Mouth/Throat: Mucous membranes are moist. Neck: No stridor.  Cardiovascular: Normal rate, regular rhythm. Grossly normal heart sounds.  Good peripheral circulation. Respiratory: Normal respiratory effort.  No retractions. Lungs CTAB. Gastrointestinal: Soft and nontender. No distention.  Exhibits fairly severe right-sided CVA tenderness.  No pain to palpation suprapubic or deep pelvic region. Musculoskeletal: No lower extremity tenderness nor edema. Neurologic:  Normal speech and language. No gross focal neurologic deficits are appreciated.  Skin:  Skin is warm, dry and intact. No rash noted. Psychiatric: Mood and affect are normal. Speech and behavior are normal.  ____________________________________________   LABS (all labs ordered are listed, but only abnormal results are displayed)  Labs Reviewed  URINALYSIS, COMPLETE (UACMP) WITH MICROSCOPIC - Abnormal; Notable for the following components:      Result Value   Color, Urine YELLOW (*)    APPearance CLOUDY (*)    Hgb urine dipstick MODERATE (*)    Protein, ur 100 (*)    Leukocytes,Ua LARGE (*)    WBC, UA >50 (*)    Bacteria, UA RARE (*)    All other components within normal limits  BASIC METABOLIC PANEL - Abnormal; Notable for the following components:   Glucose, Bld 102 (*)    Calcium 8.8 (*)    All other components within normal limits  CBC  HEPATIC FUNCTION PANEL  LIPASE, BLOOD    ____________________________________________  EKG   ____________________________________________  RADIOLOGY  CT Renal Stone Study  Result Date: 05/10/2020 CLINICAL DATA:  Right flank pain radiating into the right abdomen and back. EXAM: CT ABDOMEN AND PELVIS WITHOUT CONTRAST TECHNIQUE: Multidetector CT imaging of the abdomen and pelvis was performed following the standard protocol without IV contrast. COMPARISON:  12/09/2004. FINDINGS: Lower chest: Unremarkable. Hepatobiliary: No focal liver abnormality is seen. No gallstones, gallbladder wall thickening, or biliary dilatation. Pancreas: Unremarkable. No pancreatic ductal dilatation or surrounding inflammatory changes. Spleen: Normal in size without focal abnormality. Adrenals/Urinary Tract: Interval mild dilatation of the right renal collecting system and ureter to the level of the ureterovesical junction with no obstructing calculus or mass seen. Normal appearing left kidney, left ureter and urinary bladder. Normal appearing adrenal glands. Stomach/Bowel: Unremarkable stomach, small bowel and colon. No evidence of appendicitis. Vascular/Lymphatic: No significant vascular findings are present. No enlarged abdominal or pelvic lymph nodes. Reproductive: Status post hysterectomy. No adnexal masses. Other: Anterior pelvic surgical scar with a small area of associated fat necrosis. Musculoskeletal: Unremarkable bones. IMPRESSION: 1. Mild right hydronephrosis and hydroureter to the level of the ureterovesical junction with no obstructing stone seen. This may be due to spasm associated with recent stone passage. 2. Otherwise, unremarkable examination. Electronically Signed   By: Claudie Revering M.D.   On: 05/10/2020 11:01   US PELVIC COMPLETE W TRANSVAGINAL AND TORSION R/O  Result Date: 05/10/2020 CLINICAL DATA:  Right flank pain. History of a hysterectomy in 2015. EXAM: TRANSABDOMINAL AND TRANSVAGINAL ULTRASOUND OF PELVIS DOPPLER ULTRASOUND OF OVARIES  TECHNIQUE: Both transabdominal and transvaginal ultrasound examinations of the pelvis were performed. Transabdominal technique was performed for global imaging of the pelvis including uterus, ovaries, adnexal regions, and pelvic cul-de-sac. It was necessary to proceed with endovaginal exam following the transabdominal exam to visualize the ovaries to better advantage. Color and duplex Doppler ultrasound was utilized to evaluate blood flow to the ovaries. COMPARISON:  Current abdomen CT FINDINGS: Uterus Surgically absent. Right ovary Measurements: 3.8 x 2.5 x 2.4 cm = volume: 12 mL. Normal appearance/no adnexal mass. Left ovary Measurements: 2.3 x 1.8 x 1.3 cm = volume: 3 mL. Normal appearance/no adnexal mass. Pulsed Doppler evaluation of both ovaries demonstrates normal low-resistance arterial and venous waveforms. Other findings No abnormal free fluid. IMPRESSION: 1. No acute findings.  Normal ovaries and adnexa. 2. Status post hysterectomy. Electronically Signed   By: Lajean Manes M.D.   On: 05/10/2020 14:53    CT imaging reviewed mild right-sided hydronephrosis.  No obvious obstructing stone at this time.  Additionally these images were reviewed in the ER with Dr. Jeffie Pollock.  Ultrasound transvaginal negative for acute findings.  Normal ovaries. ____________________________________________   PROCEDURES  Procedure(s) performed: None  Procedures  Critical Care performed: No  ____________________________________________   INITIAL IMPRESSION / ASSESSMENT AND PLAN / ED COURSE  Pertinent labs & imaging results that were available during my care of the patient were reviewed by me and considered in my medical decision making (see chart for details).   Differential diagnosis includes but is not limited to, abdominal perforation, aortic dissection, cholecystitis, appendicitis, diverticulitis, colitis, esophagitis/gastritis, kidney stone, pyelonephritis, urinary tract infection, aortic aneurysm. All are  considered in decision and treatment plan. Based upon the patient's presentation and risk factors, I am very suspicious of possible urologic condition such as kidney stone.  She did not have any systemic symptoms fever or leukocytosis.  We will proceed with imaging studies.  Not pregnant, ovarian etiology also considered gynecologic etc. and other intra-abdominal pathologies but feel less likely  Clinical Course as of  05/10/20 1707  Sat May 10, 2020  1025 Pain has improved but still moderate, patient would like some additional pain medicine and antiemetic.  Not actively vomiting.  Awaiting CT. [MQ]  1779 Patient resting comfortably her pain is much improved.  I did reviewed her CT scan as well as her history and urinalysis with Dr. Jeffie Pollock of urology and he advises to treat with antibiotic and close outpatient urology follow-up.  There is no evidence of an acute obstructing stone at this time and it seems the patient may have passed the stone.  She may however have some urinary tract infection or pyelonephritis, which we will treat for with cephalosporin.  Patient pain and symptoms much improved.  Will be following up with urology, Dr. Jeffie Pollock will have [facility] reach out to her for scheduling on Monday [MQ]    Clinical Course User Index [MQ] Delman Kitten, MD   I will prescribe the patient a narcotic pain medicine due to their condition which I anticipate will cause at least moderate pain short term. I discussed with the patient safe use of narcotic pain medicines, and that they are not to drive, work in dangerous areas, or ever take more than prescribed (no more than 1 pill every 6 hours). We discussed that this is the type of medication that can be  overdosed on and the risks of this type of medicine. Patient is very agreeable to only use as prescribed and to never use more than prescribed.  Return precautions and treatment recommendations and follow-up discussed with the patient who is  agreeable with the plan.  Husband driving home.  Patient understanding unstrapped while taking hydrocodone.  We will follow-up closely with urology.  ____________________________________________   FINAL CLINICAL IMPRESSION(S) / ED DIAGNOSES  Final diagnoses:  Kidney stone  Lower urinary tract infection, acute        Note:  This document was prepared using Dragon voice recognition software and may include unintentional dictation errors       Delman Kitten, MD 05/10/20 1710

## 2020-05-14 ENCOUNTER — Other Ambulatory Visit: Payer: Self-pay

## 2020-05-14 ENCOUNTER — Ambulatory Visit (INDEPENDENT_AMBULATORY_CARE_PROVIDER_SITE_OTHER): Payer: Managed Care, Other (non HMO) | Admitting: Family Medicine

## 2020-05-14 ENCOUNTER — Encounter (INDEPENDENT_AMBULATORY_CARE_PROVIDER_SITE_OTHER): Payer: Self-pay | Admitting: Family Medicine

## 2020-05-14 VITALS — BP 100/67 | HR 91 | Temp 98.1°F | Ht 67.0 in | Wt 192.0 lb

## 2020-05-14 DIAGNOSIS — E6609 Other obesity due to excess calories: Secondary | ICD-10-CM

## 2020-05-14 DIAGNOSIS — E559 Vitamin D deficiency, unspecified: Secondary | ICD-10-CM | POA: Diagnosis not present

## 2020-05-14 DIAGNOSIS — Z9189 Other specified personal risk factors, not elsewhere classified: Secondary | ICD-10-CM

## 2020-05-14 DIAGNOSIS — N200 Calculus of kidney: Secondary | ICD-10-CM | POA: Diagnosis not present

## 2020-05-14 DIAGNOSIS — L659 Nonscarring hair loss, unspecified: Secondary | ICD-10-CM

## 2020-05-14 DIAGNOSIS — R632 Polyphagia: Secondary | ICD-10-CM | POA: Diagnosis not present

## 2020-05-14 DIAGNOSIS — Z6834 Body mass index (BMI) 34.0-34.9, adult: Secondary | ICD-10-CM

## 2020-05-14 MED ORDER — PHENTERMINE HCL 37.5 MG PO TABS: 37.5000 mg | ORAL_TABLET | Freq: Every day | ORAL | 0 refills | Status: DC

## 2020-05-15 LAB — CBC WITH DIFFERENTIAL/PLATELET
Basophils Absolute: 0.1 10*3/uL (ref 0.0–0.2)
Basos: 1 %
EOS (ABSOLUTE): 0.2 10*3/uL (ref 0.0–0.4)
Eos: 4 %
Hemoglobin: 12.9 g/dL (ref 11.1–15.9)
Immature Grans (Abs): 0 10*3/uL (ref 0.0–0.1)
Immature Granulocytes: 0 %
Lymphocytes Absolute: 2 10*3/uL (ref 0.7–3.1)
Lymphs: 39 %
MCH: 31.6 pg (ref 26.6–33.0)
MCHC: 32.5 g/dL (ref 31.5–35.7)
MCV: 97 fL (ref 79–97)
Monocytes Absolute: 0.4 10*3/uL (ref 0.1–0.9)
Monocytes: 7 %
Neutrophils Absolute: 2.5 10*3/uL (ref 1.4–7.0)
Neutrophils: 49 %
Platelets: 263 10*3/uL (ref 150–450)
RBC: 4.08 x10E6/uL (ref 3.77–5.28)
RDW: 11.7 % (ref 11.7–15.4)
WBC: 5.1 10*3/uL (ref 3.4–10.8)

## 2020-05-15 LAB — COMPREHENSIVE METABOLIC PANEL
ALT: 9 IU/L (ref 0–32)
AST: 8 IU/L (ref 0–40)
Albumin/Globulin Ratio: 2 (ref 1.2–2.2)
Albumin: 4.6 g/dL (ref 3.8–4.8)
Alkaline Phosphatase: 76 IU/L (ref 44–121)
BUN/Creatinine Ratio: 21 (ref 9–23)
BUN: 13 mg/dL (ref 6–20)
Bilirubin Total: 0.2 mg/dL (ref 0.0–1.2)
CO2: 24 mmol/L (ref 20–29)
Calcium: 9.1 mg/dL (ref 8.7–10.2)
Chloride: 104 mmol/L (ref 96–106)
Creatinine, Ser: 0.62 mg/dL (ref 0.57–1.00)
Globulin, Total: 2.3 g/dL (ref 1.5–4.5)
Glucose: 84 mg/dL (ref 65–99)
Potassium: 4 mmol/L (ref 3.5–5.2)
Sodium: 143 mmol/L (ref 134–144)
Total Protein: 6.9 g/dL (ref 6.0–8.5)
eGFR: 121 mL/min/{1.73_m2} (ref >59)

## 2020-05-15 LAB — LIPID PANEL
Chol/HDL Ratio: 4 ratio (ref 0.0–4.4)
Cholesterol, Total: 188 mg/dL (ref 100–199)
HDL: 47 mg/dL (ref >39)
LDL Chol Calc (NIH): 129 mg/dL — ABNORMAL HIGH (ref 0–99)
Triglycerides: 67 mg/dL (ref 0–149)
VLDL Cholesterol Cal: 12 mg/dL (ref 5–40)

## 2020-05-15 LAB — ANEMIA PANEL
Ferritin: 26 ng/mL (ref 15–150)
Folate, Hemolysate: 468 ng/mL
Folate, RBC: 1179 ng/mL (ref >498)
Hematocrit: 39.7 % (ref 34.0–46.6)
Iron Saturation: 14 % — ABNORMAL LOW (ref 15–55)
Iron: 38 ug/dL (ref 27–159)
Retic Ct Pct: 1 % (ref 0.6–2.6)
Total Iron Binding Capacity: 278 ug/dL (ref 250–450)
UIBC: 240 ug/dL (ref 131–425)
Vitamin B-12: 354 pg/mL (ref 232–1245)

## 2020-05-15 LAB — INSULIN, RANDOM: INSULIN: 7 u[IU]/mL (ref 2.6–24.9)

## 2020-05-15 LAB — T4, FREE: Free T4: 1.18 ng/dL (ref 0.82–1.77)

## 2020-05-15 LAB — C-REACTIVE PROTEIN: CRP: 4 mg/L (ref 0–10)

## 2020-05-15 LAB — SEDIMENTATION RATE: Sed Rate: 3 mm/hr (ref 0–32)

## 2020-05-15 LAB — VITAMIN D 25 HYDROXY (VIT D DEFICIENCY, FRACTURES): Vit D, 25-Hydroxy: 22.9 ng/mL — ABNORMAL LOW (ref 30.0–100.0)

## 2020-05-15 LAB — TSH: TSH: 1.11 u[IU]/mL (ref 0.450–4.500)

## 2020-05-15 LAB — HEMOGLOBIN A1C
Est. average glucose Bld gHb Est-mCnc: 108 mg/dL
Hgb A1c MFr Bld: 5.4 % (ref 4.8–5.6)

## 2020-05-21 NOTE — Progress Notes (Signed)
Chief Complaint:   OBESITY Debra Hernandez is here to discuss her progress with her obesity treatment plan along with follow-up of her obesity related diagnoses.   Today's visit was #: 7 Starting weight: 220 lbs Starting date: 10/24/2019 Today's weight: 192 lbs Today's date: 05/14/2020 Weight change since last visit: 10 lbs Total lbs lost to date: 28 lbs Body mass index is 30.07 kg/m.  Total weight loss percentage to date: -12.73%  Current Meal Plan: keeping a food journal and adhering to recommended goals of 1300-1500 calories and 95 grams of protein for 70-80 % of the time.  Current Exercise Plan: Cardio and strength training for 30 minutes 5 times per week.  Assessment/Plan:   Orders Placed This Encounter  Procedures  . Anemia panel  . CBC with Differential/Platelet  . Comprehensive metabolic panel  . Hemoglobin A1c  . Insulin, random  . Lipid panel  . VITAMIN D 25 Hydroxy (Vit-D Deficiency, Fractures)  . TSH  . Thyroid Peroxidase Antibodies (TPO) (REFL)  . T4, free  . Sed Rate (ESR)  . C-reactive protein   Meds ordered this encounter  Medications  . phentermine (ADIPEX-P) 37.5 MG tablet    Sig: Take 1 tablet (37.5 mg total) by mouth daily before breakfast.    Dispense:  90 tablet    Refill:  0    1. Polyphagia Current treatment: phentermine 37.5 mg daily. Polyphagia refers to excessive feelings of hunger. She will continue to focus on protein-rich, low simple carbohydrate foods. We reviewed the importance of hydration, regular exercise for stress reduction, and restorative sleep.  Having again reminded the patient of the "off label" use of Phentermine beyond three consecutive months, and again discussing the risks, benefits, contraindications, and limitations of it's use; given it's role in the successful treatment of obesity thus far and lack of adverse effect, patient has expressed desire and given informed verbal consent to continue use.   I have consulted the [place]  Controlled Substances Registry for this patient, and feel the risk/benefit ratio today is favorable for proceeding with this prescription for a controlled substance. The patient understands monitoring parameters and red flags.   - Refill phentermine (ADIPEX-P) 37.5 MG tablet; Take 1 tablet (37.5 mg total) by mouth daily before breakfast.  Dispense: 90 tablet; Refill: 0  2. Hair loss Focal, anular, occipital. Not c/w telogen effluvium. Debra Hernandez will call her dermatologist. We will continue to monitor symptoms as they relate to her weight loss journey.  3. Vitamin D deficiency Not at goal. Current vitamin D is 22.9, tested on 05/14/2020. Optimal goal > 50 ng/dL.  Marland KitchenShe is taking vitamin D 50,000 IU weekly.   Plan: Continue to take prescription Vitamin D _0 ,000 IU every week as prescribed.  Will check vitamin D level today.  - VITAMIN D 25 Hydroxy (Vit-D Deficiency, Fractures)  4. Kidney stones, calcium oxalate We will continue to monitor symptoms as they relate to her weight loss journey.  5. At risk for dehydration Debra Hernandez is at higher than average risk of dehydration.  Debra Hernandez was given more than 9 minutes of proper hydration counseling today.  We discussed the signs and symptoms of dehydration, some of which may include muscle cramping, constipation or even orthostatic symptoms.  Counseling on the prevention of dehydration was also provided today.  Debra Hernandez is at risk for dehydration due to weight loss, lifestyle and behavorial habits and possibly due to taking certain medication(s).  She was encouraged to adequately hydrate and monitor fluid status to  avoid dehydration as well as weight loss plateaus.  Unless pre-existing renal or cardiopulmonary conditions exist, in which patient was told to limit their fluid intake, I recommended roughly one half of their weight in pounds to be the approximate ounces of non-caloric, non-caffeinated beverages they should drink per day; including more if they are  engaging in exercise.  6. Obesity, current BMI 30.2  Course: Debra Hernandez is currently in the action stage of change. As such, her goal is to continue with weight loss efforts.   Nutrition goals: She has agreed to keeping a food journal and adhering to recommended goals of 1300-1500 calories and 95 grams of protein.   Exercise goals: As is.  Behavioral modification strategies: increasing lean protein intake, decreasing simple carbohydrates, increasing vegetables and increasing water intake.  Debra Hernandez has agreed to follow-up with our clinic in 4 weeks. She was informed of the importance of frequent follow-up visits to maximize her success with intensive lifestyle modifications for her multiple health conditions.   Debra Hernandez was informed we would discuss her lab results at her next visit unless there is a critical issue that needs to be addressed sooner. Debra Hernandez agreed to keep her next visit at the agreed upon time to discuss these results.  Objective:   Blood pressure 100/67, pulse 91, temperature 98.1 F (36.7 C), temperature source Oral, height _0  (1.702 m), weight 192 lb (87.1 kg), last menstrual period 03/23/2013, SpO2 97 %. Body mass index is 30.07 kg/m.  General: Cooperative, alert, well developed, in no acute distress. HEENT: Conjunctivae and lids unremarkable. Cardiovascular: Regular rhythm.  Lungs: Normal work of breathing. Neurologic: No focal deficits.   Lab Results  Component Value Date   CREATININE 0.62 05/14/2020   BUN 13 05/14/2020   NA 143 05/14/2020   K 4.0 05/14/2020   CL 104 05/14/2020   CO2 24 05/14/2020   Lab Results  Component Value Date   ALT 9 05/14/2020   AST 8 05/14/2020   ALKPHOS 76 05/14/2020   BILITOT 0.2 05/14/2020   Lab Results  Component Value Date   HGBA1C 5.4 05/14/2020   HGBA1C 5.3 08/29/2013   Lab Results  Component Value Date   INSULIN 7.0 05/14/2020   Lab Results  Component Value Date   TSH 1.110 05/14/2020   Lab Results   Component Value Date   CHOL 188 05/14/2020   HDL 47 05/14/2020   LDLCALC 129 (H) 05/14/2020   TRIG 67 05/14/2020   CHOLHDL 4.0 05/14/2020   Lab Results  Component Value Date   WBC 5.1 05/14/2020   HGB 12.9 05/14/2020   HCT 39.7 05/14/2020   MCV 97 05/14/2020   PLT 263 05/14/2020   Lab Results  Component Value Date   IRON 38 05/14/2020   TIBC 278 05/14/2020   FERRITIN 26 05/14/2020   Attestation Statements:   Reviewed by clinician on day of visit: allergies, medications, problem list, medical history, surgical history, family history, social history, and previous encounter notes.  I, Water quality scientist, CMA, am acting as transcriptionist for Briscoe Deutscher, DO  I have reviewed the above documentation for accuracy and completeness, and I agree with the above. Briscoe Deutscher, DO

## 2020-05-27 ENCOUNTER — Other Ambulatory Visit: Payer: Self-pay

## 2020-05-27 ENCOUNTER — Encounter: Payer: Self-pay | Admitting: Family Medicine

## 2020-05-27 ENCOUNTER — Ambulatory Visit (INDEPENDENT_AMBULATORY_CARE_PROVIDER_SITE_OTHER): Payer: Managed Care, Other (non HMO) | Admitting: Family Medicine

## 2020-05-27 VITALS — BP 102/68 | HR 94 | Resp 16 | Ht 67.0 in | Wt 195.0 lb

## 2020-05-27 DIAGNOSIS — Z113 Encounter for screening for infections with a predominantly sexual mode of transmission: Secondary | ICD-10-CM | POA: Diagnosis not present

## 2020-05-27 DIAGNOSIS — R300 Dysuria: Secondary | ICD-10-CM

## 2020-05-27 MED ORDER — SULFAMETHOXAZOLE-TRIMETHOPRIM 800-160 MG PO TABS: 1.0000 | ORAL_TABLET | Freq: Two times a day (BID) | ORAL | 0 refills | Status: DC

## 2020-05-27 NOTE — Progress Notes (Signed)
Acute Office Visit  Subjective:    Patient ID: Debra Hernandez, female    DOB: 1985/07/02, 35 y.o.   MRN: [id number]  No chief complaint on file.   HPI  Patient is in today for kidney stone/UTI. She was seen in the ER on 05/10/2020.   Past Medical History:  Diagnosis Date  . Allergies   . Anemia    H/O. no current issues  . Anxiety   . Blood transfusion 5/11   2 units bld transfused at [facility]  . BRCA negative 2015  . Depression   . Eczema   . History of seasonal allergies   . IBS (irritable bowel syndrome)   . Kidney problem   . Kidney stone    passsed stone - no surgery required  . Melanoma ([place]) 2003   on back - used local to removed  . Polycystic ovary syndrome   . PONV (postoperative nausea and vomiting)   . Swallowing difficulty   . Wears contact lenses     Past Surgical History:  Procedure Laterality Date  . ABDOMINAL HYSTERECTOMY  2015  . ABDOMINOPLASTY    . CESAREAN SECTION  05/2009   at 39 wks at [facility],  lived only 12 days  . CESAREAN SECTION  02/02/2011   Procedure: CESAREAN SECTION;  Surgeon: Donnamae Jude, MD;  Location: [facility];  Service: Gynecology;  Laterality: N/A;  Repeart c/section  . COLONOSCOPY WITH PROPOFOL N/A 12/20/2014   Procedure: COLONOSCOPY WITH PROPOFOL;  Surgeon: Lucilla Lame, MD;  Location: [facility];  Service: Endoscopy;  Laterality: N/A;  . DIAGNOSTIC LAPAROSCOPY     ovarian cyst removed  . LAPAROSCOPIC BILATERAL SALPINGECTOMY Bilateral 07/20/2012   Procedure: LAPAROSCOPIC BILATERAL SALPINGECTOMY;  Surgeon: Osborne Oman, MD;  Location: [facility];  Service: Gynecology;  Laterality: Bilateral;  . LAPAROSCOPIC HYSTERECTOMY Left 04/18/2013   Procedure: HYSTERECTOMY TOTAL LAPAROSCOPIC and  left salpingectomy;  Surgeon: Donnamae Jude, MD;  Location: [facility];  Service: Gynecology;  Laterality: Left;  . laporascopy    . NOVASURE ABLATION N/A 07/20/2012   Procedure: NOVASURE ABLATION;  Surgeon: Osborne Oman,  MD;  Location: [facility];  Service: Gynecology;  Laterality: N/A;  . OVARIAN CYST REMOVAL    . SCAR REVISION  02/02/2011   Procedure: SCAR REVISION;  Surgeon: Donnamae Jude, MD;  Location: [facility];  Service: Gynecology;  Laterality: N/A;  . SVD  04/2006   x 1 - [facility]  . TONSILLECTOMY    . TONSILLECTOMY AND ADENOIDECTOMY    . WISDOM TOOTH EXTRACTION     2 lower extracted    Family History  Problem Relation Age of Onset  . Cancer Mother 74       Ovarian  . Parkinson's disease Mother   . Mental illness Mother   . Depression Mother   . Thyroid disease Mother   . Anxiety disorder Mother   . Cancer Maternal Grandmother        Ovarian  . Parkinson's disease Maternal Grandmother   . Diabetes Maternal Grandfather   . Cancer Father 18       prostate  . Hypertension Father   . Hyperlipidemia Father   . Cancer Paternal Aunt 71       Breast  . Anesthesia problems Neg Hx   . Hypotension Neg Hx   . Malignant hyperthermia Neg Hx   . Pseudochol deficiency Neg Hx     Social History   Socioeconomic  History  . Marital status: Married    Spouse name: Not on file  . Number of children: Not on file  . Years of education: Not on file  . Highest education level: Not on file  Occupational History  . Occupation: Metallurgist  Tobacco Use  . Smoking status: Never Smoker  . Smokeless tobacco: Never Used  Substance and Sexual Activity  . Alcohol use: Yes    Comment: occasionally  . Drug use: No  . Sexual activity: Yes    Partners: Male    Birth control/protection: None    Comment: pregnant  Other Topics Concern  . Not on file  Social History Narrative  . Not on file   Social Determinants of Health   Financial Resource Strain: Not on file  Food Insecurity: Not on file  Transportation Needs: Not on file  Physical Activity: Not on file  Stress: Not on file  Social Connections: Not on file  Intimate Partner Violence: Not on file    Outpatient Medications  Prior to Visit  Medication Sig Dispense Refill  . Aspirin-Acetaminophen-Caffeine (GOODY HEADACHE PO) Take by mouth as needed.    [name] BIOTIN PO Take by mouth.    . COLLAGEN PO Take by mouth.    . cyanocobalamin (,VITAMIN B-12,) 1000 MCG/ML injection Inject weekly x 4 weeks, then every 30 days after 10 mL 0  . levocetirizine (XYZAL) 5 MG tablet Take 5 mg by mouth every evening.    . mometasone (ELOCON) 0.1 % cream Apply twice daily to hands as needed for eczema flares. Avoid applying to face, groin, and axilla. Use as directed. Risk of skin atrophy with long-term use reviewed. 45 g 2  . phentermine (ADIPEX-P) 37.5 MG tablet Take 1 tablet (37.5 mg total) by mouth daily before breakfast. 90 tablet 0  . polyvinyl alcohol (LIQUIFILM TEARS) 1.4 % ophthalmic solution Place 1 drop into both eyes as needed for dry eyes. Reported on 06/18/2015    . SYRINGE-NEEDLE, DISP, 3 ML 25G X 1" 3 ML MISC Use to give b12 100 each 0  . valACYclovir (VALTREX) 1000 MG tablet TAKE 2 TABLETS BY MOUTH TWICE A DAY FOR 1 DAY AT ONSET OF COLD SORE. MAY REPEAT AS NECESSARY 120 tablet 1  . valACYclovir (VALTREX) 500 MG tablet Take 55m twice daily increasing to 1009mtwice daily for 3 days with flares. 60 tablet 5  . Vitamin D, Ergocalciferol, (DRISDOL) 1.25 MG (50000 UNIT) CAPS capsule TAKE 1 CAPSULE BY MOUTH EVERY 7 DAYS 12 capsule 0   No facility-administered medications prior to visit.    Allergies  Allergen Reactions  . Atomoxetine Other (See Comments)  . Monistat [Miconazole] Swelling    Swelling shut in the vaginal area per patient  . Strattera [Atomoxetine Hcl] Nausea And Vomiting and Other (See Comments)    insomnia    Review of Systems  Constitutional: Negative.   HENT: Negative.   Respiratory: Negative.   Cardiovascular: Negative.   Gastrointestinal: Negative.   Genitourinary: Positive for frequency. Negative for hematuria, vaginal bleeding and vaginal discharge.  Musculoskeletal: Positive for back pain.        Objective:     Today's Vitals   05/27/20 0829  BP: 102/68  Pulse: 94  Resp: 16  Weight: 195 lb (88.5 kg)  Height: 5' 7" (1.702 m)   Body mass index is 30.54 kg/m.  Physical Exam Constitutional:      General: She is not in acute distress.    Appearance: She is  well-developed.  HENT:     Head: Normocephalic and atraumatic.     Right Ear: Hearing normal.     Left Ear: Hearing normal.     Nose: Nose normal.  Eyes:     General: Lids are normal. No scleral icterus.       Right eye: No discharge.        Left eye: No discharge.     Conjunctiva/sclera: Conjunctivae normal.  Cardiovascular:     Rate and Rhythm: Normal rate and regular rhythm.     Heart sounds: Normal heart sounds.  Pulmonary:     Effort: Pulmonary effort is normal. No respiratory distress.     Breath sounds: Normal breath sounds.  Abdominal:     General: Bowel sounds are normal.     Tenderness: There is right CVA tenderness.  Musculoskeletal:        General: Tenderness present. Normal range of motion.     Cervical back: Neck supple.     Comments: Tender to palpate right lower back.  Skin:    Findings: No lesion or rash.  Neurological:     Mental Status: She is alert and oriented to person, place, and time.  Psychiatric:        Speech: Speech normal.        Behavior: Behavior normal.        Thought Content: Thought content normal.     LMP 03/23/2013 Comment: DUB Wt Readings from Last 3 Encounters:  05/14/20 192 lb (87.1 kg)  05/10/20 196 lb (88.9 kg)  03/12/20 202 lb (91.6 kg)    Health Maintenance Due  Topic Date Due  . COVID-19 Vaccine (1) Never done  . TETANUS/TDAP  09/18/2018  . PAP SMEAR-Modifier  06/04/2019    There are no preventive care reminders to display for this patient.   Lab Results  Component Value Date   TSH 1.110 05/14/2020   Lab Results  Component Value Date   WBC 5.1 05/14/2020   HGB 12.9 05/14/2020   HCT 39.7 05/14/2020   MCV 97 05/14/2020   PLT 263  05/14/2020   Lab Results  Component Value Date   NA 143 05/14/2020   K 4.0 05/14/2020   CO2 24 05/14/2020   GLUCOSE 84 05/14/2020   BUN 13 05/14/2020   CREATININE 0.62 05/14/2020   BILITOT 0.2 05/14/2020   ALKPHOS 76 05/14/2020   AST 8 05/14/2020   ALT 9 05/14/2020   PROT 6.9 05/14/2020   ALBUMIN 4.6 05/14/2020   CALCIUM 9.1 05/14/2020   ANIONGAP 6 05/10/2020   EGFR 121 05/14/2020   Lab Results  Component Value Date   CHOL 188 05/14/2020   Lab Results  Component Value Date   HDL 47 05/14/2020   Lab Results  Component Value Date   LDLCALC 129 (H) 05/14/2020   Lab Results  Component Value Date   TRIG 67 05/14/2020   Lab Results  Component Value Date   CHOLHDL 4.0 05/14/2020   Lab Results  Component Value Date   HGBA1C 5.4 05/14/2020       Assessment & Plan:   1. Dysuria Has had right flank and back pain for the past 2 weeks. Was better after going to the ER and finding evidence of recent kidney stone passage on CT scan. Was treated with antibiotics and was much better. Some burning with urination has returned. Will give Septra-DS and may use AZO-Standard. Increase fluid intake and started urine C&S. - Ct/Ng NAA rfx Tv NAA -  Urine Culture - sulfamethoxazole-trimethoprim (BACTRIM DS) 800-160 MG tablet; Take 1 tablet by mouth 2 (two) times daily.  Dispense: 14 tablet; Refill: 0  2. Screening for STD (sexually transmitted disease) Concerned about possible STD with recent symptoms and realizing husband is a truck driver away from home frequently. - Ct/Ng NAA rfx Tv NAA        Juluis Mire, CMA   I, Dennis Chrismon, PA-C, have reviewed all documentation for this visit. The documentation on 05/27/20 for the exam, diagnosis, procedures, and orders are all accurate and complete.

## 2020-05-29 LAB — CT/NG NAA RFX TV NAA
Chlamydia by NAA: NEGATIVE
Gonococcus by NAA: NEGATIVE

## 2020-05-29 LAB — URINE CULTURE

## 2020-06-17 ENCOUNTER — Ambulatory Visit: Payer: Managed Care, Other (non HMO) | Admitting: Dermatology

## 2020-06-17 ENCOUNTER — Encounter: Payer: Self-pay | Admitting: Dermatology

## 2020-06-17 ENCOUNTER — Other Ambulatory Visit: Payer: Self-pay

## 2020-06-17 DIAGNOSIS — L659 Nonscarring hair loss, unspecified: Secondary | ICD-10-CM

## 2020-06-17 DIAGNOSIS — L905 Scar conditions and fibrosis of skin: Secondary | ICD-10-CM | POA: Diagnosis not present

## 2020-06-17 DIAGNOSIS — L650 Telogen effluvium: Secondary | ICD-10-CM

## 2020-06-17 MED ORDER — CLOBETASOL PROPIONATE 0.05 % EX FOAM: Freq: Two times a day (BID) | CUTANEOUS | 1 refills | Status: DC

## 2020-06-17 NOTE — Patient Instructions (Addendum)
Start clobetasol 0.05% foam twice daily to affected areas at scalp. Avoid applying to face, groin, and axilla. Use as directed. Risk of skin atrophy with long-term use reviewed.   Topical steroids (such as triamcinolone, fluocinolone, fluocinonide, mometasone, clobetasol, halobetasol, betamethasone, hydrocortisone) can cause thinning and lightening of the skin if they are used for too long in the same area. Your physician has selected the right strength medicine for your problem and area affected on the body. Please use your medication only as directed by your physician to prevent side effects.   If you have any questions or concerns for your doctor, please call our main line at [phone] and press option 4 to reach your doctor's medical assistant. If no one answers, please leave a voicemail as directed and we will return your call as soon as possible. Messages left after 4 pm will be answered the following business day.   You may also send [place] a message via [place]. We typically respond to MyChart messages within 1-2 business days.  For prescription refills, please ask your pharmacy to contact our office. Our fax number is [phone].  If you have an urgent issue when the clinic is closed that cannot wait until the next business day, you can page your doctor at the number below.    Please note that while we do our best to be available for urgent issues outside of office hours, we are not available 24/7.   If you have an urgent issue and are unable to reach [place], you may choose to seek medical care at your doctor's office, retail clinic, urgent care center, or emergency room.  If you have a medical emergency, please immediately call 911 or go to the emergency department.  Pager Numbers  - Dr. Nehemiah Massed: [phone]  - Dr. Laurence Ferrari: [phone]  - Dr. Nicole Kindred: [phone]  In the event of inclement weather, please call our main line at [phone] for an update on the status of any delays  or closures.  Dermatology Medication Tips: Please keep the boxes that topical medications come in in order to help keep track of the instructions about where and how to use these. Pharmacies typically print the medication instructions only on the boxes and not directly on the medication tubes.   If your medication is too expensive, please contact our office at [phone] option 4 or send [place] a message through [place].   We are unable to tell what your co-pay for medications will be in advance as this is different depending on your insurance coverage. However, we may be able to find a substitute medication at lower cost or fill out paperwork to get insurance to cover a needed medication.   If a prior authorization is required to get your medication covered by your insurance company, please allow [place] 1-2 business days to complete this process.  Drug prices often vary depending on where the prescription is filled and some pharmacies may offer cheaper prices.  The website www.goodrx.com contains coupons for medications through different pharmacies. The prices here do not account for what the cost may be with help from insurance (it may be cheaper with your insurance), but the website can give you the price if you did not use any insurance.  - You can print the associated coupon and take it with your prescription to the pharmacy.  - You may also stop by our office during regular business hours and pick up a GoodRx coupon card.  - If you need your prescription sent  electronically to a different pharmacy, notify our office through [facility] or by phone at [phone] option 4.

## 2020-06-17 NOTE — Progress Notes (Signed)
Follow-Up Visit   Subjective  Debra Hernandez is a 35 y.o. female who presents for the following: Alopecia (Patient noticed hair loss end of February/beginning of March. Patient's children noticed a bald spot at back of scalp. Patient did have Covid end of January and also is deficient in ferritin, vitamin d and b-12. She is taking vitamin d 1.25mg  and b-12 injection once a week.).  The following portions of the chart were reviewed this encounter and updated as appropriate:   Tobacco  Allergies  Meds  Problems  Med Hx  Surg Hx  Fam Hx       Review of Systems:  No other skin or systemic complaints except as noted in HPI or Assessment and Plan.  Objective  Well appearing patient in no apparent distress; mood and affect are within normal limits.  A focused examination was performed including scalp. Relevant physical exam findings are noted in the Assessment and Plan.  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 Avoid applying to face, groin, and axilla. Use as directed. Risk of skin atrophy with long-term use reviewed.   Topical steroids (such as triamcinolone, fluocinolone, fluocinonide, mometasone, clobetasol, halobetasol, betamethasone, hydrocortisone) can cause thinning and lightening of the skin if they are used for too long in the same area. Your physician has selected the right strength medicine for your problem and area affected on the body. Please use your medication only as directed by your physician to  prevent side effects.   [place] [id number]   Intralesional injection - Scalp Location: vertex and frontal scalp  Informed Consent: Discussed risks (infection, pain, bleeding, bruising, thinning of the skin, loss of skin pigment, lack of resolution, and recurrence of lesion) and benefits of the procedure, as well as the alternatives. Informed consent was obtained. Preparation: The area was prepared a standard fashion.  Procedure Details: An intralesional injection was performed with Kenalog 2.5 mg/cc. 2 cc in total were injected.  Total number of injections: > 12  Plan: The patient was instructed on post-op care. Recommend OTC analgesia as needed for pain.   clobetasol (OLUX) 0.05 % topical foam - Scalp Apply topically 2 (two) times daily. To affected areas at scalp. Avoid applying to face, groin, and axilla. Use as directed. Risk of skin atrophy with long-term use reviewed.  Telogen effluvium Scalp  Telogen effluvium is a benign, self limited condition causing increased hair shedding usually for several months. It does not progress to baldness. It can be triggered by recent illness, recent surgery, thyroid disease, low iron stores, vitamin D deficiency, fad diets or rapid weight loss, hormonal changes such as pregnancy or birth control pills, and some medication. Usually the hair loss starts 2-3 months after the illness or health change. Rarely, it can continue for longer than a year.  Known vit D and ferritin deficiency - supplementing. Recommend continued supplementation.  Discussed rogaine but defer at this time.   Scar conditions and fibrosis of skin Scalp  Focal scars line up with apparent excoriations/erosions on photo. Discussed biopsy but  deferred. Photo taken today and will monitor.  Return 4-8 weeks, for Alopecia.  Graciella Belton, RMA, am acting as scribe for Forest Gleason, MD .  Documentation: I have reviewed the above documentation for accuracy and completeness, and  I agree with the above.  Forest Gleason, MD

## 2020-06-23 ENCOUNTER — Telehealth (INDEPENDENT_AMBULATORY_CARE_PROVIDER_SITE_OTHER): Payer: Managed Care, Other (non HMO) | Admitting: Family Medicine

## 2020-06-23 ENCOUNTER — Ambulatory Visit (INDEPENDENT_AMBULATORY_CARE_PROVIDER_SITE_OTHER): Payer: Managed Care, Other (non HMO) | Admitting: Family Medicine

## 2020-06-23 ENCOUNTER — Encounter (INDEPENDENT_AMBULATORY_CARE_PROVIDER_SITE_OTHER): Payer: Self-pay | Admitting: Family Medicine

## 2020-06-23 ENCOUNTER — Other Ambulatory Visit: Payer: Self-pay

## 2020-06-23 DIAGNOSIS — F418 Other specified anxiety disorders: Secondary | ICD-10-CM | POA: Diagnosis not present

## 2020-06-23 DIAGNOSIS — L659 Nonscarring hair loss, unspecified: Secondary | ICD-10-CM

## 2020-06-23 DIAGNOSIS — E6609 Other obesity due to excess calories: Secondary | ICD-10-CM | POA: Diagnosis not present

## 2020-06-23 DIAGNOSIS — R632 Polyphagia: Secondary | ICD-10-CM

## 2020-06-23 DIAGNOSIS — Z6834 Body mass index (BMI) 34.0-34.9, adult: Secondary | ICD-10-CM

## 2020-06-23 MED ORDER — ESCITALOPRAM OXALATE 10 MG PO TABS: 10.0000 mg | ORAL_TABLET | Freq: Every day | ORAL | 0 refills | Status: DC

## 2020-06-26 ENCOUNTER — Telehealth (INDEPENDENT_AMBULATORY_CARE_PROVIDER_SITE_OTHER): Payer: Managed Care, Other (non HMO) | Admitting: Family Medicine

## 2020-07-01 NOTE — Progress Notes (Signed)
TeleHealth Visit:  Due to the COVID-19 pandemic, this visit was completed with telemedicine (audio/video) technology to reduce patient and provider exposure as well as to preserve personal protective equipment.   Debra Hernandez has verbally consented to this TeleHealth visit. The patient is located at home, the provider is located at the [facility] office. The participants in this visit include the listed provider and patient. The visit was conducted today via MyChart video.  Chief Complaint: OBESITY Debra Hernandez is here to discuss her progress with her obesity treatment plan along with follow-up of her obesity related diagnoses. Debra Hernandez is on keeping a food journal and adhering to recommended goals of 1300-1500 calories and 95 grams of protein and states she is following her eating plan approximately 75-80% of the time. Debra Hernandez states she is doing cardio and strength training for 30 minutes 5 times per week.  Today's visit was #: 8 Starting weight: 220 lbs Starting date: 10/24/2019  Interim History: Debra Hernandez has been under increased stress at work and due to finances.  She says that phentermine is still working well.  Assessment/Plan:   1. Hair loss Alopecia as well as telogen effluvium.  Now followed by Dermatology.  Plan:  We will continue to monitor symptoms as they relate to her weight loss journey.  2. Polyphagia Controlled. Current treatment: phentermine 37.5 mg daily. Polyphagia refers to excessive feelings of hunger. She will continue to focus on protein-rich, low simple carbohydrate foods. We reviewed the importance of hydration, regular exercise for stress reduction, and restorative sleep.  Plan:  The current medical regimen is effective;  continue present plan and medications.  3. Situational anxiety Start Lexapro 10 mg daily.  Will place referral to [facility] in [place] for counseling.   - Start escitalopram (LEXAPRO) 10 MG tablet; Take 1 tablet (10 mg  total) by mouth daily.  Dispense: 30 tablet; Refill: 0 - Ambulatory referral to [facility]  4. Obesity, current BMI 30.2  Debra Hernandez is currently in the action stage of change. As such, her goal is to continue with weight loss efforts. She has agreed to keeping a food journal and adhering to recommended goals of 1300-1500 calories and 95 grams of protein.   Exercise goals:  As is.  Behavioral modification strategies: increasing lean protein intake and decreasing simple carbohydrates.  Tory has agreed to follow-up with our clinic in 4 weeks. She was informed of the importance of frequent follow-up visits to maximize her success with intensive lifestyle modifications for her multiple health conditions.  Objective:   VITALS: Per patient if applicable, see vitals. GENERAL: Alert and in no acute distress. CARDIOPULMONARY: No increased WOB. Speaking in clear sentences.  PSYCH: Pleasant and cooperative. Speech normal rate and rhythm. Affect is appropriate. Insight and judgement are appropriate. Attention is focused, linear, and appropriate.  NEURO: Oriented as arrived to appointment on time with no prompting.   Lab Results  Component Value Date   CREATININE 0.62 05/14/2020   BUN 13 05/14/2020   NA 143 05/14/2020   K 4.0 05/14/2020   CL 104 05/14/2020   CO2 24 05/14/2020   Lab Results  Component Value Date   ALT 9 05/14/2020   AST 8 05/14/2020   ALKPHOS 76 05/14/2020   BILITOT 0.2 05/14/2020   Lab Results  Component Value Date   HGBA1C 5.4 05/14/2020   HGBA1C 5.3 08/29/2013   Lab Results  Component Value Date   INSULIN 7.0 05/14/2020   Lab Results  Component Value Date  TSH 1.110 05/14/2020   Lab Results  Component Value Date   CHOL 188 05/14/2020   HDL 47 05/14/2020   LDLCALC 129 (H) 05/14/2020   TRIG 67 05/14/2020   CHOLHDL 4.0 05/14/2020   Lab Results  Component Value Date   WBC 5.1 05/14/2020   HGB 12.9 05/14/2020   HCT 39.7 05/14/2020   MCV 97  05/14/2020   PLT 263 05/14/2020   Lab Results  Component Value Date   IRON 38 05/14/2020   TIBC 278 05/14/2020   FERRITIN 26 05/14/2020   Attestation Statements:   Reviewed by clinician on day of visit: allergies, medications, problem list, medical history, surgical history, family history, social history, and previous encounter notes.  I, Water quality scientist, CMA, am acting as transcriptionist for Briscoe Deutscher, DO  I have reviewed the above documentation for accuracy and completeness, and I agree with the above. Briscoe Deutscher, DO

## 2020-07-03 ENCOUNTER — Ambulatory Visit: Payer: Managed Care, Other (non HMO) | Admitting: Dermatology

## 2020-07-09 ENCOUNTER — Other Ambulatory Visit: Payer: Self-pay

## 2020-07-09 ENCOUNTER — Ambulatory Visit: Payer: Managed Care, Other (non HMO) | Admitting: Dermatology

## 2020-07-09 DIAGNOSIS — L650 Telogen effluvium: Secondary | ICD-10-CM | POA: Diagnosis not present

## 2020-07-09 DIAGNOSIS — L659 Nonscarring hair loss, unspecified: Secondary | ICD-10-CM | POA: Diagnosis not present

## 2020-07-09 DIAGNOSIS — L905 Scar conditions and fibrosis of skin: Secondary | ICD-10-CM

## 2020-07-09 MED ORDER — CLOBETASOL PROPIONATE 0.05 % EX FOAM: Freq: Two times a day (BID) | CUTANEOUS | 3 refills | Status: DC

## 2020-07-09 NOTE — Patient Instructions (Addendum)
Start minoxidil 5% (Rogaine for men) solution or foam to be applied to the scalp and left in. This should ideally be used twice daily for best results but it helps with hair regrowth when used at least three times per week. Rogaine initially can cause increased hair shedding for the first few weeks but this will stop with continued use. In studies, people who used minoxidil (Rogaine) for at least 6 months had thicker hair than people who did not. Minoxidil topical (Rogaine) only works as long as it continues to be used. If if it is no longer used then the hair it has been helping to regrow can fall out. Minoxidil topical (Rogaine) can cause increased facial hair growth which can usually be managed easily with a battery-operated hair trimmer. If facial hair growth is bothersome, switching to the 2% women's version can decrease the risk of unwanted facial hair growth.  Start gentle iron tablet by mouth every other day  Topical steroids (such as triamcinolone, fluocinolone, fluocinonide, mometasone, clobetasol, halobetasol, betamethasone, hydrocortisone) can cause thinning and lightening of the skin if they are used for too long in the same area. Your physician has selected the right strength medicine for your problem and area affected on the body. Please use your medication only as directed by your physician to prevent side effects.     If you have any questions or concerns for your doctor, please call our main line at [phone] and press option 4 to reach your doctor's medical assistant. If no one answers, please leave a voicemail as directed and we will return your call as soon as possible. Messages left after 4 pm will be answered the following business day.   You may also send [place] a message via [place]. We typically respond to MyChart messages within 1-2 business days.  For prescription refills, please ask your pharmacy to contact our office. Our fax number is [phone].  If you have an urgent  issue when the clinic is closed that cannot wait until the next business day, you can page your doctor at the number below.    Please note that while we do our best to be available for urgent issues outside of office hours, we are not available 24/7.   If you have an urgent issue and are unable to reach [place], you may choose to seek medical care at your doctor's office, retail clinic, urgent care center, or emergency room.  If you have a medical emergency, please immediately call 911 or go to the emergency department.  Pager Numbers  - Dr. Nehemiah Massed: [phone]  - Dr. Laurence Ferrari: [phone]  - Dr. Nicole Kindred: [phone]  In the event of inclement weather, please call our main line at [phone] for an update on the status of any delays or closures.  Dermatology Medication Tips: Please keep the boxes that topical medications come in in order to help keep track of the instructions about where and how to use these. Pharmacies typically print the medication instructions only on the boxes and not directly on the medication tubes.   If your medication is too expensive, please contact our office at [phone] option 4 or send [place] a message through [place].   We are unable to tell what your co-pay for medications will be in advance as this is different depending on your insurance coverage. However, we may be able to find a substitute medication at lower cost or fill out paperwork to get insurance to cover a needed medication.   If a  prior authorization is required to get your medication covered by your insurance company, please allow [place] 1-2 business days to complete this process.  Drug prices often vary depending on where the prescription is filled and some pharmacies may offer cheaper prices.  The website www.goodrx.com contains coupons for medications through different pharmacies. The prices here do not account for what the cost may be with help from insurance (it may be cheaper with your  insurance), but the website can give you the price if you did not use any insurance.  - You can print the associated coupon and take it with your prescription to the pharmacy.  - You may also stop by our office during regular business hours and pick up a GoodRx coupon card.  - If you need your prescription sent electronically to a different pharmacy, notify our office through [facility] or by phone at [phone] option 4.

## 2020-07-09 NOTE — Progress Notes (Signed)
Follow-Up Visit   Subjective  Debra Hernandez is a 35 y.o. female who presents for the following: Follow-up (Patient here today for 4 - 8 week follow up on alopecia. At last appointment she was prescribed clobetasol foam to use topically on scalp. She reports not noticing any change since using. Reports still having a lot of shedding. She states she washing hair only 2 - 3 times weekly. She also received ILK injections at last visit into scalp. ).  Since her last visit she has started using virtue flourish density booster. Patient states that she was just lexapro.   The following portions of the chart were reviewed this encounter and updated as appropriate:  Tobacco  Allergies  Meds  Problems  Med Hx  Surg Hx  Fam Hx      Review of Systems: No other skin or systemic complaints.  Objective  Well appearing patient in no apparent distress; mood and affect are within normal limits.  A focused examination was performed including scalp, face. Relevant physical exam findings are noted in the Assessment and Plan.  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  Improving. There are 3 small scars within this area that are stable and older in stage at this time.  Avoid rubbing, pulling, traction   Continue clobetasol 0.05% foam twice daily to affected areas at scalp. Avoid applying to face, groin, and axilla. Use as directed. Risk of skin atrophy with long-term use reviewed.   Topical steroids (such as triamcinolone, fluocinolone, fluocinonide, mometasone, clobetasol, halobetasol, betamethasone, hydrocortisone) can cause thinning and lightening of the skin if they are used for too long in the same area. Your physician has selected the right strength medicine for your problem and area affected on  the body. Please use your medication only as directed by your physician to prevent side effects.  Related Medications clobetasol (OLUX) 0.05 % topical foam Apply topically 2 (two) times daily. To affected areas at scalp. Avoid applying to face, groin, and axilla. Use as directed. Risk of skin atrophy with long-term use reviewed.  Telogen effluvium Scalp  Telogen effluvium is a benign, self limited condition causing increased hair shedding usually for several months. It does not progress to baldness. It can be triggered by recent illness, recent surgery, thyroid disease, low iron stores, vitamin D deficiency, fad diets or rapid weight loss, hormonal changes such as pregnancy or birth control pills, and some medication. Usually the hair loss starts 2-3 months after the illness or health change. Rarely, it can continue for longer than a year.   Positive hair pull test   Patient denies gas appliances at home  Known vit D deficiency - supplementing. Recommend continued supplementation.  Ferritin wnl but low for optimal hair regrowth. Recommend starting Gentle Iron every other day.   Recommend minoxidil 5% (Rogaine for men) solution or foam to be applied to the scalp and left in. This should ideally be used twice daily for best results but it helps with hair regrowth when used at least three times per week. Rogaine initially can cause increased hair shedding for the first few weeks but this will stop with continued use. In studies, people who used minoxidil (Rogaine) for at least 6 months had thicker hair than  people who did not. Minoxidil topical (Rogaine) only works as long as it continues to be used. If if it is no longer used then the hair it has been helping to regrow can fall out. Minoxidil topical (Rogaine) can cause increased facial hair growth which can usually be managed easily with a battery-operated hair trimmer. If facial hair growth is bothersome, switching to the 2% women's version can  decrease the risk of unwanted facial hair growth.  Scar conditions and fibrosis of skin Scalp  Focal scars line up with apparent excoriations/erosions on photo. Continue to monitor  Return for 3 - 4 months follow up on alopecia . I, Ruthell Rummage, CMA, am acting as scribe for Forest Gleason, MD.  Documentation: I have reviewed the above documentation for accuracy and completeness, and I agree with the above.  Forest Gleason, MD

## 2020-07-17 ENCOUNTER — Encounter: Payer: Self-pay | Admitting: Dermatology

## 2020-07-25 ENCOUNTER — Other Ambulatory Visit (INDEPENDENT_AMBULATORY_CARE_PROVIDER_SITE_OTHER): Payer: Self-pay | Admitting: Family Medicine

## 2020-07-25 DIAGNOSIS — F418 Other specified anxiety disorders: Secondary | ICD-10-CM

## 2020-07-28 NOTE — Telephone Encounter (Signed)
Dr.Wallace °

## 2020-07-29 MED ORDER — ESCITALOPRAM OXALATE 10 MG PO TABS: 10.0000 mg | ORAL_TABLET | Freq: Every day | ORAL | 0 refills | Status: DC

## 2020-08-13 ENCOUNTER — Other Ambulatory Visit: Payer: Self-pay

## 2020-08-13 ENCOUNTER — Ambulatory Visit (INDEPENDENT_AMBULATORY_CARE_PROVIDER_SITE_OTHER): Payer: Managed Care, Other (non HMO) | Admitting: Family Medicine

## 2020-08-13 ENCOUNTER — Encounter (INDEPENDENT_AMBULATORY_CARE_PROVIDER_SITE_OTHER): Payer: Self-pay | Admitting: Family Medicine

## 2020-08-13 VITALS — BP 106/73 | HR 93 | Temp 98.5°F | Ht 67.0 in | Wt 189.0 lb

## 2020-08-13 DIAGNOSIS — R790 Abnormal level of blood mineral: Secondary | ICD-10-CM

## 2020-08-13 DIAGNOSIS — Z6834 Body mass index (BMI) 34.0-34.9, adult: Secondary | ICD-10-CM

## 2020-08-13 DIAGNOSIS — F418 Other specified anxiety disorders: Secondary | ICD-10-CM

## 2020-08-13 DIAGNOSIS — E538 Deficiency of other specified B group vitamins: Secondary | ICD-10-CM | POA: Diagnosis not present

## 2020-08-13 DIAGNOSIS — E559 Vitamin D deficiency, unspecified: Secondary | ICD-10-CM

## 2020-08-13 DIAGNOSIS — R5383 Other fatigue: Secondary | ICD-10-CM

## 2020-08-13 DIAGNOSIS — R632 Polyphagia: Secondary | ICD-10-CM

## 2020-08-13 DIAGNOSIS — R5381 Other malaise: Secondary | ICD-10-CM

## 2020-08-13 DIAGNOSIS — E6609 Other obesity due to excess calories: Secondary | ICD-10-CM

## 2020-08-13 DIAGNOSIS — E7800 Pure hypercholesterolemia, unspecified: Secondary | ICD-10-CM

## 2020-08-13 DIAGNOSIS — Z9189 Other specified personal risk factors, not elsewhere classified: Secondary | ICD-10-CM

## 2020-08-13 DIAGNOSIS — N200 Calculus of kidney: Secondary | ICD-10-CM

## 2020-08-13 DIAGNOSIS — L659 Nonscarring hair loss, unspecified: Secondary | ICD-10-CM

## 2020-08-13 MED ORDER — VITAMIN D (ERGOCALCIFEROL) 1.25 MG (50000 UNIT) PO CAPS: ORAL_CAPSULE | ORAL | 0 refills | Status: DC

## 2020-08-13 MED ORDER — CYANOCOBALAMIN 1000 MCG/ML IJ SOLN: INTRAMUSCULAR | 0 refills | Status: AC

## 2020-08-13 MED ORDER — "SYRINGE/NEEDLE (DISP) 25G X 1"" 3 ML MISC": 0 refills | Status: AC

## 2020-08-13 MED ORDER — ESCITALOPRAM OXALATE 20 MG PO TABS: 20.0000 mg | ORAL_TABLET | Freq: Every day | ORAL | 0 refills | Status: DC

## 2020-08-13 MED ORDER — PHENTERMINE HCL 37.5 MG PO TABS: 37.5000 mg | ORAL_TABLET | Freq: Every day | ORAL | 0 refills | Status: DC

## 2020-08-14 ENCOUNTER — Encounter (INDEPENDENT_AMBULATORY_CARE_PROVIDER_SITE_OTHER): Payer: Self-pay

## 2020-08-14 LAB — CBC WITH DIFFERENTIAL/PLATELET
Basophils Absolute: 0 10*3/uL (ref 0.0–0.2)
Basos: 1 %
EOS (ABSOLUTE): 0.2 10*3/uL (ref 0.0–0.4)
Eos: 3 %
Hemoglobin: 12.4 g/dL (ref 11.1–15.9)
Immature Grans (Abs): 0 10*3/uL (ref 0.0–0.1)
Immature Granulocytes: 0 %
Lymphocytes Absolute: 1.9 10*3/uL (ref 0.7–3.1)
Lymphs: 32 %
MCH: 31.3 pg (ref 26.6–33.0)
MCHC: 32 g/dL (ref 31.5–35.7)
MCV: 98 fL — ABNORMAL HIGH (ref 79–97)
Monocytes Absolute: 0.5 10*3/uL (ref 0.1–0.9)
Monocytes: 8 %
Neutrophils Absolute: 3.4 10*3/uL (ref 1.4–7.0)
Neutrophils: 56 %
Platelets: 220 10*3/uL (ref 150–450)
RBC: 3.96 x10E6/uL (ref 3.77–5.28)
RDW: 12.2 % (ref 11.7–15.4)
WBC: 6 10*3/uL (ref 3.4–10.8)

## 2020-08-14 LAB — COMPREHENSIVE METABOLIC PANEL WITH GFR
ALT: 9 [IU]/L (ref 0–32)
AST: 11 [IU]/L (ref 0–40)
Albumin/Globulin Ratio: 1.7 (ref 1.2–2.2)
Albumin: 4 g/dL (ref 3.8–4.8)
Alkaline Phosphatase: 66 [IU]/L (ref 44–121)
BUN/Creatinine Ratio: 23 (ref 9–23)
BUN: 14 mg/dL (ref 6–20)
Bilirubin Total: 0.2 mg/dL (ref 0.0–1.2)
CO2: 22 mmol/L (ref 20–29)
Calcium: 8.7 mg/dL (ref 8.7–10.2)
Chloride: 105 mmol/L (ref 96–106)
Creatinine, Ser: 0.62 mg/dL (ref 0.57–1.00)
Globulin, Total: 2.3 g/dL (ref 1.5–4.5)
Glucose: 69 mg/dL (ref 65–99)
Potassium: 3.7 mmol/L (ref 3.5–5.2)
Sodium: 142 mmol/L (ref 134–144)
Total Protein: 6.3 g/dL (ref 6.0–8.5)
eGFR: 121 mL/min/{1.73_m2}

## 2020-08-14 LAB — ANEMIA PANEL
Ferritin: 25 ng/mL (ref 15–150)
Folate, Hemolysate: 450 ng/mL
Folate, RBC: 1163 ng/mL
Hematocrit: 38.7 % (ref 34.0–46.6)
Iron Saturation: 17 % (ref 15–55)
Iron: 47 ug/dL (ref 27–159)
Retic Ct Pct: 1.4 % (ref 0.6–2.6)
Total Iron Binding Capacity: 270 ug/dL (ref 250–450)
UIBC: 223 ug/dL (ref 131–425)
Vitamin B-12: 222 pg/mL — ABNORMAL LOW (ref 232–1245)

## 2020-08-14 LAB — TSH: TSH: 0.89 u[IU]/mL (ref 0.450–4.500)

## 2020-08-14 LAB — LIPID PANEL
Chol/HDL Ratio: 3.4 ratio (ref 0.0–4.4)
Cholesterol, Total: 158 mg/dL (ref 100–199)
HDL: 47 mg/dL
LDL Chol Calc (NIH): 89 mg/dL (ref 0–99)
Triglycerides: 120 mg/dL (ref 0–149)
VLDL Cholesterol Cal: 22 mg/dL (ref 5–40)

## 2020-08-14 LAB — THYROID PEROXIDASE ANTIBODY: Thyroperoxidase Ab SerPl-aCnc: <8 [IU]/mL (ref 0–34)

## 2020-08-14 LAB — VITAMIN D 25 HYDROXY (VIT D DEFICIENCY, FRACTURES): Vit D, 25-Hydroxy: 28.3 ng/mL — ABNORMAL LOW (ref 30.0–100.0)

## 2020-08-19 NOTE — Progress Notes (Signed)
Chief Complaint:   OBESITY Debra Hernandez is here to discuss her progress with her obesity treatment plan along with follow-up of her obesity related diagnoses.   Today's visit was #: 9 Starting weight: 220 lbs Starting date: 10/24/2019 Today's weight: 189 lbs Today's date: 08/13/2020 Weight change since last visit: -3 lbs Total lbs lost to date: 31 lbs Body mass index is 29.6 kg/m.  Total weight loss percentage to date: -14.09%  Current Meal Plan: keeping a food journal and adhering to recommended goals of 1300-1500 calories and 95 grams of protein for 85% of the time.  Current Exercise Plan: Home workout for 30 minutes 5 times per week. Current Anti-Obesity Medications: Phentermine 37.5 mg daily. Side effects: None.  Interim History: Debra Hernandez was diagnosed with alopecia, dermatology thinks it is autoimmune related. Her polyphagia is controlled.  Assessment/Plan:   1. Polyphagia Controlled. Current treatment: phentermine 37.5 mg daily. She will continue to focus on protein-rich, low simple carbohydrate foods. We reviewed the importance of hydration, regular exercise for stress reduction, and restorative sleep.   Plan:  The current medical regimen is effective;  continue present plan and medications.  Having again reminded the patient of the "off label" use of Phentermine beyond three consecutive months, and again discussing the risks, benefits, contraindications, and limitations of it's use; given it's role in the successful treatment of obesity thus far and lack of adverse effect, patient has expressed desire and given informed verbal consent to continue use.   I have consulted the [place] Controlled Substances Registry for this patient, and feel the risk/benefit ratio today is favorable for proceeding with this prescription for a controlled substance. The patient understands monitoring parameters and red flags.   - Refill phentermine (ADIPEX-P) 37.5 MG tablet; Take 1 tablet (37.5 mg  total) by mouth daily before breakfast.  Dispense: 90 tablet; Refill: 0  2. Vitamin D deficiency Not at goal.   Plan: Continue to take prescription Vitamin D '@50'$ ,000 IU every week as prescribed.  We will check labs today.  Lab Results  Component Value Date   VD25OH 22.9 (L) 05/14/2020   VD25OH 19.3 (L) 01/15/2020   - VITAMIN D 25 Hydroxy (Vit-D Deficiency, Fractures) - Refill Vitamin D, Ergocalciferol, (DRISDOL) 1.25 MG (50000 UNIT) CAPS capsule; TAKE 1 CAPSULE BY MOUTH EVERY 7 DAYS  Dispense: 12 capsule; Refill: 0  3. Low ferritin level Plan: We will check labs today.   Nutrition: Iron-rich foods include dark leafy greens, red and white meats, eggs, seafood, and beans.  Certain foods and drinks prevent your body from absorbing iron properly. Avoid eating these foods in the same meal as iron-rich foods or with iron supplements. These foods include: coffee, black tea, and red wine; milk, dairy products, and foods that are high in calcium; beans and soybeans; whole grains. Constipation can be a side effect of iron supplementation. Increased water and fiber intake are helpful. Water goal: > 2 liters/day. Fiber goal: > 25 grams/day.  - Anemia panel - CBC with Differential/Platelet  4. B12 deficiency Lab Results  Component Value Date   VITAMINB12 222 (L) 08/13/2020   Supplementation: Vitamin B12 1000 mcg/mL weekly.   Plan: Continue current treatment recheck her B12 level today.   - Anemia panel - Refill cyanocobalamin (,VITAMIN B-12,) 1000 MCG/ML injection; Inject weekly x 4 weeks, then every 30 days after  Dispense: 10 mL; Refill: 0 - Refill SYRINGE-NEEDLE, DISP, 3 ML 25G X 1" 3 ML MISC; Use to give b12  Dispense: 100  each; Refill: 0  5. Pure hypercholesterolemia Course: At goal. Lipid-lowering medications: None.   Plan: Dietary changes: Increase soluble fiber, decrease simple carbohydrates, decrease saturated fat. Exercise changes: Moderate to vigorous-intensity aerobic activity  150 minutes per week or as tolerated. We will continue to monitor along with PCP/specialists as it pertains to her weight loss journey. We will check labs today.  Lab Results  Component Value Date   CHOL 158 08/13/2020   HDL 47 08/13/2020   LDLCALC 89 08/13/2020   TRIG 120 08/13/2020   CHOLHDL 3.4 08/13/2020   Lab Results  Component Value Date   ALT 9 08/13/2020   AST 11 08/13/2020   ALKPHOS 66 08/13/2020   BILITOT 0.2 08/13/2020   - Lipid panel - Comprehensive metabolic panel  6. Hair loss Alopecia as well as telogen effluvium.  Now followed by Dermatology.   Plan:  We will continue to monitor symptoms as they relate to her weight loss journey.  7. Kidney stones, calcium oxalate We will continue to monitor symptoms as they relate to her weight loss journey.  8. Malaise and fatigue Debra Hernandez does feel that her weight is causing her energy to be lower than it should be. Fatigue may be related to obesity, depression or many other causes. Labs will be ordered, and in the meanwhile, Debra Hernandez will focus on self care including making healthy food choices, increasing physical activity and focusing on stress reduction.   - Thyroid Peroxidase Antibodies (TPO) (REFL) - TSH - Thyroid Peroxidase Antibody  9. Situational anxiety Debra Hernandez is currently taking Lexapro 10 mg daily.    Plan: Increase Lexapro to 20 mg daily.   - Start escitalopram (LEXAPRO) 20 MG tablet; Take 1 tablet (20 mg total) by mouth daily.  Dispense: 30 tablet; Refill: 0  10. At risk for deficient intake of food Debra Hernandez was given extensive education and counseling today of more than 15 minutes on risks associated with deficient food intake.  Counseled her on the importance of following our prescribed meal plan and eating adequate amounts of protein.  Discussed with Debra Hernandez that inadequate food intake over longer periods of time can slow their metabolism down significantly.   11. Obesity, current BMI 29.7 Course:  Debra Hernandez is currently in the action stage of change. As such, her goal is to continue with weight loss efforts.   Nutrition goals: She has agreed to keeping a food journal and adhering to recommended goals of 1300-1500 calories and 95 grams of protein.   Exercise goals: As is.  Behavioral modification strategies: increasing lean protein intake and decreasing simple carbohydrates.  Debra Hernandez has agreed to follow-up with our clinic in 4 weeks. She was informed of the importance of frequent follow-up visits to maximize her success with intensive lifestyle modifications for her multiple health conditions.   Debra Hernandez was informed we would discuss her lab results at her next visit unless there is a critical issue that needs to be addressed sooner. Debra Hernandez agreed to keep her next visit at the agreed upon time to discuss these results.  Objective:   Blood pressure 106/73, pulse 93, temperature 98.5 F (36.9 C), height '5\' 7"'$  (1.702 m), weight 189 lb (85.7 kg), last menstrual period 03/23/2013, SpO2 98 %. Body mass index is 29.6 kg/m.  General: Cooperative, alert, well developed, in no acute distress. HEENT: Conjunctivae and lids unremarkable. Cardiovascular: Regular rhythm.  Lungs: Normal work of breathing. Neurologic: No focal deficits.   Lab Results  Component Value Date   CREATININE 0.62 05/14/2020  BUN 13 05/14/2020   NA 143 05/14/2020   K 4.0 05/14/2020   CL 104 05/14/2020   CO2 24 05/14/2020   Lab Results  Component Value Date   ALT 9 05/14/2020   AST 8 05/14/2020   ALKPHOS 76 05/14/2020   BILITOT 0.2 05/14/2020   Lab Results  Component Value Date   HGBA1C 5.4 05/14/2020   HGBA1C 5.3 08/29/2013   Lab Results  Component Value Date   INSULIN 7.0 05/14/2020   Lab Results  Component Value Date   TSH 1.110 05/14/2020   Lab Results  Component Value Date   CHOL 188 05/14/2020   HDL 47 05/14/2020   LDLCALC 129 (H) 05/14/2020   TRIG 67 05/14/2020   CHOLHDL 4.0 05/14/2020    Lab Results  Component Value Date   VD25OH 22.9 (L) 05/14/2020   VD25OH 19.3 (L) 01/15/2020   Lab Results  Component Value Date   WBC 5.1 05/14/2020   HGB 12.9 05/14/2020   HCT 39.1 05/14/2020   MCV 97 05/14/2020   PLT 263 05/14/2020   Lab Results  Component Value Date   IRON 38 05/14/2020   TIBC 278 05/14/2020   FERRITIN 26 05/14/2020   Attestation Statements:   Reviewed by clinician on day of visit: allergies, medications, problem list, medical history, surgical history, family history, social history, and previous encounter notes.  Leodis Binet Friedenbach, CMA, am acting as Location manager for [employer], DO.  I have reviewed the above documentation for accuracy and completeness, and I agree with the above. Briscoe Deutscher, DO

## 2020-09-23 ENCOUNTER — Ambulatory Visit (INDEPENDENT_AMBULATORY_CARE_PROVIDER_SITE_OTHER): Payer: Managed Care, Other (non HMO) | Admitting: Family Medicine

## 2020-09-23 ENCOUNTER — Encounter (INDEPENDENT_AMBULATORY_CARE_PROVIDER_SITE_OTHER): Payer: Self-pay | Admitting: Family Medicine

## 2020-09-23 ENCOUNTER — Other Ambulatory Visit: Payer: Self-pay

## 2020-09-23 VITALS — BP 100/60 | HR 90 | Temp 97.9°F | Ht 67.0 in | Wt 191.0 lb

## 2020-09-23 DIAGNOSIS — E6609 Other obesity due to excess calories: Secondary | ICD-10-CM

## 2020-09-23 DIAGNOSIS — R790 Abnormal level of blood mineral: Secondary | ICD-10-CM | POA: Diagnosis not present

## 2020-09-23 DIAGNOSIS — E538 Deficiency of other specified B group vitamins: Secondary | ICD-10-CM | POA: Diagnosis not present

## 2020-09-23 DIAGNOSIS — R632 Polyphagia: Secondary | ICD-10-CM

## 2020-09-23 DIAGNOSIS — E559 Vitamin D deficiency, unspecified: Secondary | ICD-10-CM | POA: Diagnosis not present

## 2020-09-23 DIAGNOSIS — Z9189 Other specified personal risk factors, not elsewhere classified: Secondary | ICD-10-CM

## 2020-09-23 DIAGNOSIS — F908 Attention-deficit hyperactivity disorder, other type: Secondary | ICD-10-CM

## 2020-09-23 DIAGNOSIS — Z6834 Body mass index (BMI) 34.0-34.9, adult: Secondary | ICD-10-CM

## 2020-09-23 MED ORDER — LISDEXAMFETAMINE DIMESYLATE 20 MG PO CAPS: 20.0000 mg | ORAL_CAPSULE | Freq: Every day | ORAL | 0 refills | Status: DC

## 2020-09-23 MED ORDER — VITAMIN D (ERGOCALCIFEROL) 1.25 MG (50000 UNIT) PO CAPS: ORAL_CAPSULE | ORAL | 0 refills | Status: AC

## 2020-09-23 NOTE — Progress Notes (Signed)
Chief Complaint:   OBESITY Debra Hernandez is here to discuss her progress with her obesity treatment plan along with follow-up of her obesity related diagnoses.   Today's visit was #: 10 Starting weight: 220 lbs Starting date: 10/24/2019 Today's weight: 191 lbs Today's date: 09/23/2020 Weight change since last visit: +2 lbs Total lbs lost to date: 29 lbs Body mass index is 29.91 kg/m.  Total weight loss percentage to date: -13.18%  Current Meal Plan: keeping a food journal and adhering to recommended goals of 1300-1500 calories and 95 grams of protein for 95% of the time.  Current Exercise Plan: Cardio/strength training for 30 minutes 5 times per week. Current Anti-Obesity Medications: phentermine 37.5 mg daily. Side effects: None.  Interim History:  Debra Hernandez says she was treated for ADHD as a child, but she has been noting more issues over the past year.  She says she has been busier lately.  Her kids are in sports.  She has been eating out more often.  Assessment/Plan:   1. B12 deficiency  Lab Results  Component Value Date   VITAMINB12 222 (L) 08/13/2020   Supplementation: Vitamin B12 1000 mcg injection monthly.   Plan:  Increase frequency of vitamin B12 to weekly injections.   2. Vitamin D deficiency Not at goal.  She is taking vitamin D 50,000 IU weekly.  Plan: Continue to take prescription Vitamin D @50 ,000 IU every week as prescribed.  Follow-up for routine testing of Vitamin D, at least 2-3 times per year to avoid over-replacement.  Lab Results  Component Value Date   VD25OH 28.3 (L) 08/13/2020   VD25OH 22.9 (L) 05/14/2020   VD25OH 19.3 (L) 01/15/2020   - Refill Vitamin D, Ergocalciferol, (DRISDOL) 1.25 MG (50000 UNIT) CAPS capsule; TAKE 1 CAPSULE BY MOUTH EVERY 7 DAYS  Dispense: 12 capsule; Refill: 0  3. Low ferritin level Debra Hernandez does not tolerate iron well.  She was given an iron-rich foods list today.  Nutrition: Iron-rich foods include dark leafy greens,  red and white meats, eggs, seafood, and beans.  Certain foods and drinks prevent your body from absorbing iron properly. Avoid eating these foods in the same meal as iron-rich foods or with iron supplements. These foods include: coffee, black tea, and red wine; milk, dairy products, and foods that are high in calcium; beans and soybeans; whole grains. Constipation can be a side effect of iron supplementation. Increased water and fiber intake are helpful. Water goal: > 2 liters/day. Fiber goal: > 25 grams/day.  4. Polyphagia Current treatment: phentermine 37.5 mg daily. She will continue to focus on protein-rich, low simple carbohydrate foods. We reviewed the importance of hydration, regular exercise for stress reduction, and restorative sleep.  5. Attention deficit hyperactivity disorder (ADHD), other type, with BED Start Vyvanse 20 mg daily for ADHD and BED.  People who binge eat feel as if they don't have control over how much they eat and have feelings of guilt or self-loathing after a binge eating episode. Debra Hernandez estimates that about 30 percent of adults with binge eating disorder also have a history of ADHD. The FDA has approved Vyvanse as a treatment option for both ADHD and binge eating. Vyvanse targets the brain's reward center by increasing the levels of dopamine and norepinephrine, the chemicals of the brain responsible for feelings of pleasure. Mindful eating is the recommended nutritional approach to treating BED.   - Start lisdexamfetamine (VYVANSE) 20 MG capsule; Take 1 capsule (20 mg total) by mouth daily.  Dispense: 30 capsule; Refill: 0  I have consulted the [place] Controlled Substances Registry for this patient, and feel the risk/benefit ratio today is favorable for proceeding with this prescription for a controlled substance. The patient understands monitoring parameters and red flags.   6. At risk for side effect of medication Due to Reneka's current conditions and  medications, she is at a higher risk for drug side effect.  At least 9 minutes was spent on counseling her about these concerns today.  We discussed the benefits and potential risks of these medications, and all of patient's concerns were addressed and questions were answered.   7. Obesity, current BMI 30  Course: Veretta is currently in the action stage of change. As such, her goal is to continue with weight loss efforts.   Nutrition goals: She has agreed to keeping a food journal and adhering to recommended goals of 1300-1500 calories and 95 grams of protein.   Exercise goals:  As is.  Behavioral modification strategies: increasing lean protein intake, decreasing simple carbohydrates, increasing vegetables, and increasing water intake.  Debra Hernandez has agreed to follow-up with our clinic in 4 weeks. She was informed of the importance of frequent follow-up visits to maximize her success with intensive lifestyle modifications for her multiple health conditions.   Objective:   Blood pressure 100/60, pulse 90, temperature 97.9 F (36.6 C), temperature source Oral, height 5\' 7"  (1.702 m), weight 191 lb (86.6 kg), last menstrual period 03/23/2013, SpO2 100 %. Body mass index is 29.91 kg/m.  General: Cooperative, alert, well developed, in no acute distress. HEENT: Conjunctivae and lids unremarkable. Cardiovascular: Regular rhythm.  Lungs: Normal work of breathing. Neurologic: No focal deficits.   Lab Results  Component Value Date   CREATININE 0.62 08/13/2020   BUN 14 08/13/2020   NA 142 08/13/2020   K 3.7 08/13/2020   CL 105 08/13/2020   CO2 22 08/13/2020   Lab Results  Component Value Date   ALT 9 08/13/2020   AST 11 08/13/2020   ALKPHOS 66 08/13/2020   BILITOT 0.2 08/13/2020   Lab Results  Component Value Date   HGBA1C 5.4 05/14/2020   HGBA1C 5.3 08/29/2013   Lab Results  Component Value Date   INSULIN 7.0 05/14/2020   Lab Results  Component Value Date   TSH 0.890  08/13/2020   Lab Results  Component Value Date   CHOL 158 08/13/2020   HDL 47 08/13/2020   LDLCALC 89 08/13/2020   TRIG 120 08/13/2020   CHOLHDL 3.4 08/13/2020   Lab Results  Component Value Date   VD25OH 28.3 (L) 08/13/2020   VD25OH 22.9 (L) 05/14/2020   VD25OH 19.3 (L) 01/15/2020   Lab Results  Component Value Date   WBC 6.0 08/13/2020   HGB 12.4 08/13/2020   HCT 38.7 08/13/2020   MCV 98 (H) 08/13/2020   PLT 220 08/13/2020   Lab Results  Component Value Date   IRON 47 08/13/2020   TIBC 270 08/13/2020   FERRITIN 25 08/13/2020   Attestation Statements:   Reviewed by clinician on day of visit: allergies, medications, problem list, medical history, surgical history, family history, social history, and previous encounter notes.  I, Water quality scientist, CMA, am acting as transcriptionist for Briscoe Deutscher, DO  I have reviewed the above documentation for accuracy and completeness, and I agree with the above. Briscoe Deutscher, DO

## 2020-10-06 ENCOUNTER — Other Ambulatory Visit (INDEPENDENT_AMBULATORY_CARE_PROVIDER_SITE_OTHER): Payer: Self-pay | Admitting: Family Medicine

## 2020-10-06 DIAGNOSIS — F418 Other specified anxiety disorders: Secondary | ICD-10-CM

## 2020-10-07 MED ORDER — ESCITALOPRAM OXALATE 20 MG PO TABS: 20.0000 mg | ORAL_TABLET | Freq: Every day | ORAL | 0 refills | Status: DC

## 2020-10-07 NOTE — Telephone Encounter (Signed)
Last OV with Dr Wallace 

## 2020-10-22 ENCOUNTER — Encounter (INDEPENDENT_AMBULATORY_CARE_PROVIDER_SITE_OTHER): Payer: Self-pay | Admitting: Family Medicine

## 2020-10-22 ENCOUNTER — Ambulatory Visit (INDEPENDENT_AMBULATORY_CARE_PROVIDER_SITE_OTHER): Payer: Managed Care, Other (non HMO) | Admitting: Family Medicine

## 2020-10-22 ENCOUNTER — Other Ambulatory Visit: Payer: Self-pay

## 2020-10-22 VITALS — BP 117/66 | HR 70 | Temp 97.6°F | Ht 67.0 in | Wt 194.0 lb

## 2020-10-22 DIAGNOSIS — Z6834 Body mass index (BMI) 34.0-34.9, adult: Secondary | ICD-10-CM

## 2020-10-22 DIAGNOSIS — F908 Attention-deficit hyperactivity disorder, other type: Secondary | ICD-10-CM

## 2020-10-22 DIAGNOSIS — E6609 Other obesity due to excess calories: Secondary | ICD-10-CM | POA: Diagnosis not present

## 2020-10-22 DIAGNOSIS — F418 Other specified anxiety disorders: Secondary | ICD-10-CM | POA: Diagnosis not present

## 2020-10-22 DIAGNOSIS — E66811 Obesity, class 1: Secondary | ICD-10-CM

## 2020-10-22 MED ORDER — ESCITALOPRAM OXALATE 20 MG PO TABS: 20.0000 mg | ORAL_TABLET | Freq: Every day | ORAL | 0 refills | Status: DC

## 2020-10-22 MED ORDER — VYVANSE 60 MG PO CHEW: 60.0000 mg | CHEWABLE_TABLET | Freq: Every day | ORAL | 0 refills | Status: DC

## 2020-10-27 NOTE — Progress Notes (Signed)
Chief Complaint:   OBESITY Debra Hernandez is here to discuss her progress with her obesity treatment plan along with follow-up of her obesity related diagnoses. See Medical Weight Management Flowsheet for complete bioelectrical impedance results.  Today's visit was #: 11 Starting weight: 220 lbs Starting date: 10/24/2019 Weight change since last visit: +3 lbs Total lbs lost to date: 26 lbs Total weight loss percentage to date: -11.82%  Nutrition Plan: Keeping a food journal and adhering to recommended goals of 1300-1500 calories and 95 grams of protein daily for 80% of the time. Activity: Home workout videos for 30 minutes 5 times per week. Anti-obesity medications: phentermine 37.5 mg daily. Reported side effects: None.  Interim History: Debra Hernandez says Vyvanse is helping BED/ADHD, but thinks her dose is too low.  She denies side effects.  Assessment/Plan:   1. Attention deficit hyperactivity disorder (ADHD), other type, with BED Debra Hernandez is taking Vyvanse 20 mg daily for ADHD and BED.  She feels that it is helping, but the dose may be too low.  Plan:  Will increase Vyvanse to 60 my daily.  The relationship between psychiatric disorders and disordered eating is complex. ADHD is the most missed diagnosis in relation to food and appetite problems.   Often the strong urge to binge or to self-medicate with food subsides once the impulsivity and inattention of ADHD are treated. A person can experience a new ability to tune in to the body's signals, control cravings and improve impulse control.  A deficiency in the two neurotransmitters norepinephrine and dopamine can lead to the following behaviors related to eating:  Poor awareness of internal cues of hunger and satiety, or fullness Inability to follow a meal plan Inability to judge portion size accurately Inability to stop bingeing or purging Distraction by continual thoughts of food, weight and body shape Increased desire to overeat,  especially high calorie, "reward" type foods Poor self-esteem due to repeated failures of self-control  - Increase Lisdexamfetamine Dimesylate (VYVANSE) 60 MG CHEW; Chew 60 mg by mouth daily at 12 noon.  Dispense: 30 tablet; Refill: 0  2. Situational anxiety Debra Hernandez is taking Lexapro 20 mg daily for anxiety.  Provided psychoeducation and facilitated discussion regarding eating as habit, self-monitoring, stimulus control, regulating eating pattern, coping skills, and barriers to success.   Plan:  Continue Lexapro 20 mg daily.  Will refill, as per below.  - Refill escitalopram (LEXAPRO) 20 MG tablet; Take 1 tablet (20 mg total) by mouth daily.  Dispense: 30 tablet; Refill: 0  3. Obesity, current BMI 30.5  Course: Debra Hernandez is currently in the action stage of change. As such, her goal is to continue with weight loss efforts.   Nutrition goals: She has agreed to keeping a food journal and adhering to recommended goals of 1300-1500 calories and 95 grams of protein.   Exercise goals:  As is.  Behavioral modification strategies: increasing lean protein intake, decreasing simple carbohydrates, increasing vegetables, and increasing water intake.  Debra Hernandez has agreed to follow-up with our clinic in 4 weeks. She was informed of the importance of frequent follow-up visits to maximize her success with intensive lifestyle modifications for her multiple health conditions.   Objective:   Blood pressure 117/66, pulse 70, temperature 97.6 F (36.4 C), temperature source Oral, height 5\' 7"  (1.702 m), weight 194 lb (88 kg), last menstrual period 03/23/2013, SpO2 99 %. Body mass index is 30.38 kg/m.  General: Cooperative, alert, well developed, in no acute distress. HEENT: Conjunctivae and lids unremarkable. Cardiovascular:  Regular rhythm.  Lungs: Normal work of breathing. Neurologic: No focal deficits.   Lab Results  Component Value Date   CREATININE 0.62 08/13/2020   BUN 14 08/13/2020   NA 142  08/13/2020   K 3.7 08/13/2020   CL 105 08/13/2020   CO2 22 08/13/2020   Lab Results  Component Value Date   ALT 9 08/13/2020   AST 11 08/13/2020   ALKPHOS 66 08/13/2020   BILITOT 0.2 08/13/2020   Lab Results  Component Value Date   HGBA1C 5.4 05/14/2020   HGBA1C 5.3 08/29/2013   Lab Results  Component Value Date   INSULIN 7.0 05/14/2020   Lab Results  Component Value Date   TSH 0.890 08/13/2020   Lab Results  Component Value Date   CHOL 158 08/13/2020   HDL 47 08/13/2020   LDLCALC 89 08/13/2020   TRIG 120 08/13/2020   CHOLHDL 3.4 08/13/2020   Lab Results  Component Value Date   VD25OH 28.3 (L) 08/13/2020   VD25OH 22.9 (L) 05/14/2020   VD25OH 19.3 (L) 01/15/2020   Lab Results  Component Value Date   WBC 6.0 08/13/2020   HGB 12.4 08/13/2020   HCT 38.7 08/13/2020   MCV 98 (H) 08/13/2020   PLT 220 08/13/2020   Lab Results  Component Value Date   IRON 47 08/13/2020   TIBC 270 08/13/2020   FERRITIN 25 08/13/2020   Attestation Statements:   Reviewed by clinician on day of visit: allergies, medications, problem list, medical history, surgical history, family history, social history, and previous encounter notes.  I, Water quality scientist, CMA, am acting as transcriptionist for Briscoe Deutscher, DO  I have reviewed the above documentation for accuracy and completeness, and I agree with the above. -  Briscoe Deutscher, DO, MS, FAAFP, DABOM - Family and Bariatric Medicine.

## 2020-11-05 ENCOUNTER — Ambulatory Visit: Payer: Managed Care, Other (non HMO) | Admitting: Dermatology

## 2020-11-17 ENCOUNTER — Encounter (INDEPENDENT_AMBULATORY_CARE_PROVIDER_SITE_OTHER): Payer: Self-pay | Admitting: Family Medicine

## 2020-11-18 NOTE — Telephone Encounter (Signed)
LOV with Dr. Wallace

## 2020-11-19 ENCOUNTER — Other Ambulatory Visit: Payer: Self-pay

## 2020-11-19 ENCOUNTER — Telehealth (INDEPENDENT_AMBULATORY_CARE_PROVIDER_SITE_OTHER): Payer: Managed Care, Other (non HMO) | Admitting: Family Medicine

## 2020-11-19 ENCOUNTER — Encounter (INDEPENDENT_AMBULATORY_CARE_PROVIDER_SITE_OTHER): Payer: Self-pay | Admitting: Family Medicine

## 2020-11-19 DIAGNOSIS — R7301 Impaired fasting glucose: Secondary | ICD-10-CM | POA: Diagnosis not present

## 2020-11-19 DIAGNOSIS — E538 Deficiency of other specified B group vitamins: Secondary | ICD-10-CM | POA: Diagnosis not present

## 2020-11-19 DIAGNOSIS — Z6834 Body mass index (BMI) 34.0-34.9, adult: Secondary | ICD-10-CM

## 2020-11-19 DIAGNOSIS — E6609 Other obesity due to excess calories: Secondary | ICD-10-CM

## 2020-11-19 DIAGNOSIS — F418 Other specified anxiety disorders: Secondary | ICD-10-CM

## 2020-11-19 DIAGNOSIS — F908 Attention-deficit hyperactivity disorder, other type: Secondary | ICD-10-CM

## 2020-11-19 DIAGNOSIS — E559 Vitamin D deficiency, unspecified: Secondary | ICD-10-CM | POA: Diagnosis not present

## 2020-11-19 MED ORDER — VYVANSE 60 MG PO CHEW: 60.0000 mg | CHEWABLE_TABLET | Freq: Every day | ORAL | 0 refills | Status: DC

## 2020-11-19 MED ORDER — OZEMPIC (0.25 OR 0.5 MG/DOSE) 2 MG/1.5ML ~~LOC~~ SOPN: 0.5000 mg | PEN_INJECTOR | SUBCUTANEOUS | 0 refills | Status: DC

## 2020-11-19 MED ORDER — ESCITALOPRAM OXALATE 20 MG PO TABS: 20.0000 mg | ORAL_TABLET | Freq: Every day | ORAL | 0 refills | Status: DC

## 2020-11-19 NOTE — Progress Notes (Signed)
TeleHealth Visit:  Due to the COVID-19 pandemic, this visit was completed with telemedicine (audio/video) technology to reduce patient and provider exposure as well as to preserve personal protective equipment.   Debra Hernandez has verbally consented to this TeleHealth visit. The patient is located at home, the provider is located at the [facility] office. The participants in this visit include the listed provider and patient. The visit was conducted today via MyChart video.  Chief Complaint: OBESITY Debra Hernandez is here to discuss her progress with her obesity treatment plan along with follow-up of her obesity related diagnoses. Debra Hernandez is on keeping a food journal and adhering to recommended goals of 1300-1500 calories and 95 grams of protein and states she is following her eating plan approximately 80-90% of the time. Debra Hernandez states she is doing cardio and strength training for 30 minutes 5 times per week.  Today's visit was #: 12 Starting weight: 220 lbs Starting date: 10/24/2019  Interim History: Home weight is reported as 197 pounds.  Debra Hernandez endorses diarrhea and anxiety.  She says that Vyvanse is helping, but she notes fatigue in the early afternoon.  She says that work is overwhelming.  Assessment/Plan:   1. IFG (impaired fasting glucose) with polyphagia Not at goal. Current treatment: None.    Plan:  Start Ozempic 0.25 mg subcutaneously weekly, as per below.  She will continue to focus on protein-rich, low simple carbohydrate foods. We reviewed the importance of hydration, regular exercise for stress reduction, and restorative sleep.  - Start Semaglutide,0.25 or 0.5MG /DOS, (OZEMPIC, 0.25 OR 0.5 MG/DOSE,) 2 MG/1.5ML SOPN; Inject 0.5 mg into the skin once a week.  Dispense: 1.5 mL; Refill: 0  2. B12 deficiency Lab Results  Component Value Date   VITAMINB12 222 (L) 08/13/2020   Debra Hernandez is getting monthly vitamin B12 injections. Consider increasing the frequency.  3.  Vitamin D deficiency Not at goal. She is taking vitamin D 50,000 IU weekly.  Plan: Continue to take prescription Vitamin D @50 ,000 IU every week as prescribed.  Follow-up for routine testing of Vitamin D, at least 2-3 times per year to avoid over-replacement.  Lab Results  Component Value Date   VD25OH 28.3 (L) 08/13/2020   VD25OH 22.9 (L) 05/14/2020   VD25OH 19.3 (L) 01/15/2020   4. Attention deficit hyperactivity disorder (ADHD), other type, with BED Controlled. Medication:  Vyvanse 60 mg daily.  Counseling ADHD is the most missed diagnosis in relation to food and appetite problems. Often the strong urge to binge or to self-medicate with food subsides once the impulsivity and inattention of ADHD are treated. A person can experience a new ability to tune in to the body's signals, control cravings and improve impulse control.  A deficiency in norepinephrine and dopamine can lead to the following behaviors related to eating:  Poor awareness of internal cues of hunger and satiety, or fullness. Inability to follow a meal plan. Inability to judge portion size accurately. Inability to stop bingeing or purging. Distraction by continual thoughts of food, weight and body shape. Increased desire to overeat, especially high calorie, "reward" type foods. Poor self-esteem due to repeated failures of self-control.  Plan: Continue Vyvanse 60 mg daily.  Will refill today.  - Refill Lisdexamfetamine Dimesylate (VYVANSE) 60 MG CHEW; Chew 60 mg by mouth daily at 12 noon.  Dispense: 30 tablet; Refill: 0  I have consulted the [place] Controlled Substances Registry for this patient, and feel the risk/benefit ratio today is favorable for proceeding with this prescription for  a controlled substance. The patient understands monitoring parameters and red flags.   5. Situational anxiety Debra Hernandez is taking Lexapro 20 mg daily for anxiety.  Plan:  Continue Lexapro at 20 mg dose.  Will refill today.  - Refill  escitalopram (LEXAPRO) 20 MG tablet; Take 1 tablet (20 mg total) by mouth daily.  Dispense: 30 tablet; Refill: 0  6. Obesity, current BMI 30.5  Debra Hernandez is currently in the action stage of change. As such, her goal is to continue with weight loss efforts. She has agreed to keeping a food journal and adhering to recommended goals of 1300-1500 calories and 95 grams of protein.   Exercise goals:  As is.  Behavioral modification strategies: increasing lean protein intake, decreasing simple carbohydrates, increasing vegetables, increasing water intake, and emotional eating strategies.  Debra Hernandez has agreed to follow-up with our clinic in 4 weeks. She was informed of the importance of frequent follow-up visits to maximize her success with intensive lifestyle modifications for her multiple health conditions.  Objective:   VITALS: Per patient if applicable, see vitals. GENERAL: Alert and in no acute distress. CARDIOPULMONARY: No increased WOB. Speaking in clear sentences.  PSYCH: Pleasant and cooperative. Speech normal rate and rhythm. Affect is appropriate. Insight and judgement are appropriate. Attention is focused, linear, and appropriate.  NEURO: Oriented as arrived to appointment on time with no prompting.   Lab Results  Component Value Date   CREATININE 0.62 08/13/2020   BUN 14 08/13/2020   NA 142 08/13/2020   K 3.7 08/13/2020   CL 105 08/13/2020   CO2 22 08/13/2020   Lab Results  Component Value Date   ALT 9 08/13/2020   AST 11 08/13/2020   ALKPHOS 66 08/13/2020   BILITOT 0.2 08/13/2020   Lab Results  Component Value Date   HGBA1C 5.4 05/14/2020   HGBA1C 5.3 08/29/2013   Lab Results  Component Value Date   INSULIN 7.0 05/14/2020   Lab Results  Component Value Date   TSH 0.890 08/13/2020   Lab Results  Component Value Date   CHOL 158 08/13/2020   HDL 47 08/13/2020   LDLCALC 89 08/13/2020   TRIG 120 08/13/2020   CHOLHDL 3.4 08/13/2020   Lab Results  Component Value  Date   VD25OH 28.3 (L) 08/13/2020   VD25OH 22.9 (L) 05/14/2020   VD25OH 19.3 (L) 01/15/2020   Lab Results  Component Value Date   WBC 6.0 08/13/2020   HGB 12.4 08/13/2020   HCT 38.7 08/13/2020   MCV 98 (H) 08/13/2020   PLT 220 08/13/2020   Lab Results  Component Value Date   IRON 47 08/13/2020   TIBC 270 08/13/2020   FERRITIN 25 08/13/2020   Attestation Statements:   Reviewed by clinician on day of visit: allergies, medications, problem list, medical history, surgical history, family history, social history, and previous encounter notes.  Time spent on visit including pre-visit chart review and post-visit charting and care was 30 minutes.   I, Water quality scientist, CMA, am acting as transcriptionist for Briscoe Deutscher, DO  I have reviewed the above documentation for accuracy and completeness, and I agree with the above. - Briscoe Deutscher, DO, MS, FAAFP, DABOM - Family and Bariatric Medicine.

## 2020-12-01 ENCOUNTER — Encounter (INDEPENDENT_AMBULATORY_CARE_PROVIDER_SITE_OTHER): Payer: Self-pay

## 2020-12-01 ENCOUNTER — Telehealth (INDEPENDENT_AMBULATORY_CARE_PROVIDER_SITE_OTHER): Payer: Self-pay | Admitting: Family Medicine

## 2020-12-01 NOTE — Telephone Encounter (Signed)
Prior authorization denied for Ozempic. Patient sent mychart message.

## 2020-12-24 ENCOUNTER — Ambulatory Visit (INDEPENDENT_AMBULATORY_CARE_PROVIDER_SITE_OTHER): Payer: Managed Care, Other (non HMO) | Admitting: Family Medicine

## 2020-12-24 ENCOUNTER — Other Ambulatory Visit: Payer: Self-pay

## 2020-12-24 ENCOUNTER — Encounter (INDEPENDENT_AMBULATORY_CARE_PROVIDER_SITE_OTHER): Payer: Self-pay | Admitting: Family Medicine

## 2020-12-24 VITALS — BP 102/65 | HR 89 | Temp 98.0°F | Ht 67.0 in | Wt 189.0 lb

## 2020-12-24 DIAGNOSIS — E538 Deficiency of other specified B group vitamins: Secondary | ICD-10-CM | POA: Diagnosis not present

## 2020-12-24 DIAGNOSIS — F418 Other specified anxiety disorders: Secondary | ICD-10-CM

## 2020-12-24 DIAGNOSIS — R5381 Other malaise: Secondary | ICD-10-CM

## 2020-12-24 DIAGNOSIS — E559 Vitamin D deficiency, unspecified: Secondary | ICD-10-CM | POA: Diagnosis not present

## 2020-12-24 DIAGNOSIS — E282 Polycystic ovarian syndrome: Secondary | ICD-10-CM

## 2020-12-24 DIAGNOSIS — R5383 Other fatigue: Secondary | ICD-10-CM

## 2020-12-24 DIAGNOSIS — R790 Abnormal level of blood mineral: Secondary | ICD-10-CM | POA: Diagnosis not present

## 2020-12-24 DIAGNOSIS — E6609 Other obesity due to excess calories: Secondary | ICD-10-CM

## 2020-12-24 DIAGNOSIS — Z6834 Body mass index (BMI) 34.0-34.9, adult: Secondary | ICD-10-CM

## 2020-12-24 DIAGNOSIS — F908 Attention-deficit hyperactivity disorder, other type: Secondary | ICD-10-CM

## 2020-12-24 DIAGNOSIS — E7800 Pure hypercholesterolemia, unspecified: Secondary | ICD-10-CM

## 2020-12-24 MED ORDER — VYVANSE 60 MG PO CHEW: 60.0000 mg | CHEWABLE_TABLET | Freq: Every day | ORAL | 0 refills | Status: DC

## 2020-12-24 MED ORDER — ESCITALOPRAM OXALATE 20 MG PO TABS: 20.0000 mg | ORAL_TABLET | Freq: Every day | ORAL | 0 refills | Status: DC

## 2020-12-25 LAB — ANEMIA PANEL
Ferritin: 33 ng/mL (ref 15–150)
Folate, Hemolysate: 410 ng/mL
Folate, RBC: 1102 ng/mL (ref >498)
Hematocrit: 37.2 % (ref 34.0–46.6)
Iron Saturation: 29 % (ref 15–55)
Iron: 73 ug/dL (ref 27–159)
Retic Ct Pct: 1.7 % (ref 0.6–2.6)
Total Iron Binding Capacity: 250 ug/dL (ref 250–450)
UIBC: 177 ug/dL (ref 131–425)
Vitamin B-12: 398 pg/mL (ref 232–1245)

## 2020-12-25 LAB — LIPID PANEL
Chol/HDL Ratio: 3.2 ratio (ref 0.0–4.4)
Cholesterol, Total: 148 mg/dL (ref 100–199)
HDL: 46 mg/dL (ref >39)
LDL Chol Calc (NIH): 85 mg/dL (ref 0–99)
Triglycerides: 93 mg/dL (ref 0–149)
VLDL Cholesterol Cal: 17 mg/dL (ref 5–40)

## 2020-12-25 LAB — T4, FREE: Free T4: 1.17 ng/dL (ref 0.82–1.77)

## 2020-12-25 LAB — COMPREHENSIVE METABOLIC PANEL
ALT: 10 IU/L (ref 0–32)
AST: 12 IU/L (ref 0–40)
Albumin/Globulin Ratio: 2 (ref 1.2–2.2)
Albumin: 4.2 g/dL (ref 3.8–4.8)
Alkaline Phosphatase: 65 IU/L (ref 44–121)
BUN/Creatinine Ratio: 22 (ref 9–23)
BUN: 13 mg/dL (ref 6–20)
Bilirubin Total: 0.3 mg/dL (ref 0.0–1.2)
CO2: 26 mmol/L (ref 20–29)
Calcium: 9.1 mg/dL (ref 8.7–10.2)
Chloride: 104 mmol/L (ref 96–106)
Creatinine, Ser: 0.59 mg/dL (ref 0.57–1.00)
Globulin, Total: 2.1 g/dL (ref 1.5–4.5)
Glucose: 76 mg/dL (ref 70–99)
Potassium: 4.2 mmol/L (ref 3.5–5.2)
Sodium: 142 mmol/L (ref 134–144)
Total Protein: 6.3 g/dL (ref 6.0–8.5)
eGFR: 121 mL/min/{1.73_m2} (ref >59)

## 2020-12-25 LAB — CBC WITH DIFFERENTIAL/PLATELET
Basophils Absolute: 0 10*3/uL (ref 0.0–0.2)
Basos: 0 %
EOS (ABSOLUTE): 0.1 10*3/uL (ref 0.0–0.4)
Eos: 2 %
Hemoglobin: 12.5 g/dL (ref 11.1–15.9)
Immature Grans (Abs): 0 10*3/uL (ref 0.0–0.1)
Immature Granulocytes: 0 %
Lymphocytes Absolute: 1.9 10*3/uL (ref 0.7–3.1)
Lymphs: 35 %
MCH: 31 pg (ref 26.6–33.0)
MCHC: 33.6 g/dL (ref 31.5–35.7)
MCV: 92 fL (ref 79–97)
Monocytes Absolute: 0.5 10*3/uL (ref 0.1–0.9)
Monocytes: 8 %
Neutrophils Absolute: 2.9 10*3/uL (ref 1.4–7.0)
Neutrophils: 55 %
Platelets: 275 10*3/uL (ref 150–450)
RBC: 4.03 x10E6/uL (ref 3.77–5.28)
RDW: 12.4 % (ref 11.7–15.4)
WBC: 5.4 10*3/uL (ref 3.4–10.8)

## 2020-12-25 LAB — TSH: TSH: 0.731 u[IU]/mL (ref 0.450–4.500)

## 2020-12-25 LAB — VITAMIN D 25 HYDROXY (VIT D DEFICIENCY, FRACTURES): Vit D, 25-Hydroxy: 21 ng/mL — ABNORMAL LOW (ref 30.0–100.0)

## 2020-12-25 LAB — INSULIN, RANDOM: INSULIN: 8 u[IU]/mL (ref 2.6–24.9)

## 2020-12-25 NOTE — Progress Notes (Signed)
Chief Complaint:   OBESITY Debra Hernandez is here to discuss her progress with her obesity treatment plan along with follow-up of her obesity related diagnoses. See Medical Weight Management Flowsheet for complete bioelectrical impedance results.  Today's visit was #: 21 Starting weight: 220 lbs Starting date: 10/24/2019 Weight change since last visit: 5 lbs Total lbs lost to date: 31 lbs Total weight loss percentage to date: -14.09%  Nutrition Plan: Keeping a food journal and adhering to recommended goals of 1300-1500 calories and 95 grams of protein daily for 85-95% of the time. Activity: Home videos for 30 minutes 5 times per week.  Anti-obesity medications: Ozempic 0.5 mg subcutaneously weekly. Reported side effects: None.  Interim History: Keyara says she is doing well.  She is due for labs.  Assessment/Plan:   1. B12 deficiency Lab Results  Component Value Date   VITAMINB12 398 12/24/2020   Supplementation: Vitamin B12 injections.  Last injection was 2 weeks ago.   Plan:  Continue current treatment.  Will check vitamin B12 level today.  - Anemia panel - CBC with Differential/Platelet - Vitamin B12  2. Vitamin D deficiency Not at goal. She is taking vitamin D 50,000 IU weekly.  Plan: Continue to take prescription Vitamin D @50 ,000 IU every week as prescribed.  Will check vitamin D level today.  Lab Results  Component Value Date   VD25OH 21.0 (L) 12/24/2020   VD25OH 28.3 (L) 08/13/2020   VD25OH 22.9 (L) 05/14/2020   - VITAMIN D 25 Hydroxy (Vit-D Deficiency, Fractures)  3. Low ferritin level Nutrition: Iron-rich foods include dark leafy greens, red and white meats, eggs, seafood, and beans.  Certain foods and drinks prevent your body from absorbing iron properly. Avoid eating these foods in the same meal as iron-rich foods or with iron supplements. These foods include: coffee, black tea, and red wine; milk, dairy products, and foods that are high in calcium; beans  and soybeans; whole grains. Constipation can be a side effect of iron supplementation. Increased water and fiber intake are helpful. Water goal: > 2 liters/day. Fiber goal: > 25 grams/day.  Plan:  Will check labs today, as per below.  CBC Latest Ref Rng & Units 08/13/2020 05/14/2020  WBC 3.4 - 10.8 x10E3/uL 6.0 5.1  Hemoglobin 11.1 - 15.9 g/dL 12.4 12.9  Hematocrit 34.0 - 46.6 % 38.7 39.7  Platelets 150 - 450 x10E3/uL 220 263   - Anemia panel - CBC with Differential/Platelet  4. Malaise and fatigue Will check TSH and free T4 today as Jeannetta says she is more fatigued than usual recently.  - TSH - T4, free  5. Pure hypercholesterolemia Course: At goal. Lipid-lowering medications: None.   Plan: Dietary changes: Increase soluble fiber, decrease simple carbohydrates, decrease saturated fat. Exercise changes: Moderate to vigorous-intensity aerobic activity 150 minutes per week or as tolerated. We will continue to monitor along with PCP/specialists as it pertains to her weight loss journey.  Lab Results  Component Value Date   CHOL 148 12/24/2020   HDL 46 12/24/2020   LDLCALC 85 12/24/2020   TRIG 93 12/24/2020   CHOLHDL 3.2 12/24/2020   Lab Results  Component Value Date   ALT 10 12/24/2020   AST 12 12/24/2020   ALKPHOS 65 12/24/2020   BILITOT 0.3 12/24/2020   - Lipid panel  6. PCOS (polycystic ovarian syndrome) She will continue to focus on protein-rich, low simple carbohydrate foods. We reviewed the importance of hydration, regular exercise for stress reduction, and restorative sleep. Will  check labs today.  Counseling PCOS is a leading cause of menstrual irregularities and infertility. It is also associated with obesity, hirsutism (excessive hair growth on the face, chest, or back), and cardiovascular risk factors such as high cholesterol and insulin resistance. Insulin resistance appears to play a central role.  Women with PCOS have been shown to have impaired  appetite-regulating hormones. Women with polycystic ovary syndrome (PCOS) have an increased risk for cardiovascular disease (CVD) - European Journal of Preventive Cardiology.  - Comprehensive metabolic panel - Insulin, random  7. Attention deficit hyperactivity disorder (ADHD), other type, with BED Controlled. Medication:  Vyvanse 60 mg daily.  Counseling ADHD is the most missed diagnosis in relation to food and appetite problems. Often the strong urge to binge or to self-medicate with food subsides once the impulsivity and inattention of ADHD are treated. A person can experience a new ability to tune in to the body's signals, control cravings and improve impulse control.  A deficiency in norepinephrine and dopamine can lead to the following behaviors related to eating: Poor awareness of internal cues of hunger and satiety, or fullness. Inability to follow a meal plan. Inability to judge portion size accurately. Inability to stop bingeing or purging. Distraction by continual thoughts of food, weight and body shape. Increased desire to overeat, especially high calorie, "reward" type foods. Poor self-esteem due to repeated failures of self-control.  Plan: Continue Vyvanse 60 mg daily.  Will refill today, as per below.  - Refill Lisdexamfetamine Dimesylate (VYVANSE) 60 MG CHEW; Chew 60 mg by mouth daily at 12 noon.  Dispense: 30 tablet; Refill: 0  I have consulted the [place] Controlled Substances Registry for this patient, and feel the risk/benefit ratio today is favorable for proceeding with this prescription for a controlled substance. The patient understands monitoring parameters and red flags.   8. Situational anxiety Viviane is taking Lexapro 20 mg for anxiety.  Plan:  Continue Lexapro at current dose.  Will send refill today.  - Refill escitalopram (LEXAPRO) 20 MG tablet; Take 1 tablet (20 mg total) by mouth daily.  Dispense: 30 tablet; Refill: 0  9. Obesity, current BMI  29.7  Course: Blondina is currently in the action stage of change. As such, her goal is to continue with weight loss efforts.   Nutrition goals: She has agreed to keeping a food journal and adhering to recommended goals of 1300-1500 calories and 95 grams of protein.   Exercise goals:  As is.  Behavioral modification strategies: increasing lean protein intake, decreasing simple carbohydrates, increasing vegetables, and increasing water intake.  Kavina has agreed to follow-up with our clinic in 4 weeks. She was informed of the importance of frequent follow-up visits to maximize her success with intensive lifestyle modifications for her multiple health conditions.   Lunabelle was informed we would discuss her lab results at her next visit unless there is a critical issue that needs to be addressed sooner. Iyani agreed to keep her next visit at the agreed upon time to discuss these results.  Objective:   Blood pressure 102/65, pulse 89, temperature 98 F (36.7 C), temperature source Oral, height 5\' 7"  (1.702 m), weight 189 lb (85.7 kg), last menstrual period 03/23/2013, SpO2 100 %. Body mass index is 29.6 kg/m.  General: Cooperative, alert, well developed, in no acute distress. HEENT: Conjunctivae and lids unremarkable. Cardiovascular: Regular rhythm.  Lungs: Normal work of breathing. Neurologic: No focal deficits.   Lab Results  Component Value Date   CREATININE 0.59 12/24/2020  BUN 13 12/24/2020   NA 142 12/24/2020   K 4.2 12/24/2020   CL 104 12/24/2020   CO2 26 12/24/2020   Lab Results  Component Value Date   ALT 10 12/24/2020   AST 12 12/24/2020   ALKPHOS 65 12/24/2020   BILITOT 0.3 12/24/2020   Lab Results  Component Value Date   HGBA1C 5.4 05/14/2020   HGBA1C 5.3 08/29/2013   Lab Results  Component Value Date   INSULIN 8.0 12/24/2020   INSULIN 7.0 05/14/2020   Lab Results  Component Value Date   TSH 0.731 12/24/2020   Lab Results  Component Value Date    CHOL 148 12/24/2020   HDL 46 12/24/2020   LDLCALC 85 12/24/2020   TRIG 93 12/24/2020   CHOLHDL 3.2 12/24/2020   Lab Results  Component Value Date   VD25OH 21.0 (L) 12/24/2020   VD25OH 28.3 (L) 08/13/2020   VD25OH 22.9 (L) 05/14/2020   Lab Results  Component Value Date   WBC 5.4 12/24/2020   HGB 12.5 12/24/2020   HCT 37.2 12/24/2020   MCV 92 12/24/2020   PLT 275 12/24/2020   Lab Results  Component Value Date   IRON 73 12/24/2020   TIBC 250 12/24/2020   FERRITIN 33 12/24/2020   Attestation Statements:   Reviewed by clinician on day of visit: allergies, medications, problem list, medical history, surgical history, family history, social history, and previous encounter notes.  I, Water quality scientist, CMA, am acting as transcriptionist for Briscoe Deutscher, DO  I have reviewed the above documentation for accuracy and completeness, and I agree with the above. -  Briscoe Deutscher, DO, MS, FAAFP, DABOM - Family and Bariatric Medicine.

## 2021-01-13 ENCOUNTER — Other Ambulatory Visit (INDEPENDENT_AMBULATORY_CARE_PROVIDER_SITE_OTHER): Payer: Self-pay | Admitting: Family Medicine

## 2021-01-13 DIAGNOSIS — R7301 Impaired fasting glucose: Secondary | ICD-10-CM

## 2021-01-13 MED ORDER — OZEMPIC (0.25 OR 0.5 MG/DOSE) 2 MG/1.5ML ~~LOC~~ SOPN: 0.5000 mg | PEN_INJECTOR | SUBCUTANEOUS | 0 refills | Status: DC

## 2021-02-03 ENCOUNTER — Telehealth (INDEPENDENT_AMBULATORY_CARE_PROVIDER_SITE_OTHER): Payer: Self-pay

## 2021-02-03 NOTE — Telephone Encounter (Signed)
Pt called in and stated that she has an appt on the 7th and she wanted to know if she could do a virtual she said that dr. Juleen China has done this before due to her location. Please advise let me know

## 2021-02-10 ENCOUNTER — Encounter (INDEPENDENT_AMBULATORY_CARE_PROVIDER_SITE_OTHER): Payer: Self-pay

## 2021-02-10 ENCOUNTER — Telehealth (INDEPENDENT_AMBULATORY_CARE_PROVIDER_SITE_OTHER): Payer: Managed Care, Other (non HMO) | Admitting: Family Medicine

## 2021-02-10 ENCOUNTER — Other Ambulatory Visit: Payer: Self-pay

## 2021-02-10 ENCOUNTER — Encounter (INDEPENDENT_AMBULATORY_CARE_PROVIDER_SITE_OTHER): Payer: Self-pay | Admitting: Family Medicine

## 2021-02-10 DIAGNOSIS — R7301 Impaired fasting glucose: Secondary | ICD-10-CM | POA: Diagnosis not present

## 2021-02-10 DIAGNOSIS — Z6829 Body mass index (BMI) 29.0-29.9, adult: Secondary | ICD-10-CM

## 2021-02-10 DIAGNOSIS — F908 Attention-deficit hyperactivity disorder, other type: Secondary | ICD-10-CM | POA: Diagnosis not present

## 2021-02-10 DIAGNOSIS — E669 Obesity, unspecified: Secondary | ICD-10-CM

## 2021-02-10 DIAGNOSIS — F418 Other specified anxiety disorders: Secondary | ICD-10-CM

## 2021-02-10 DIAGNOSIS — E6609 Other obesity due to excess calories: Secondary | ICD-10-CM

## 2021-02-10 DIAGNOSIS — Z6834 Body mass index (BMI) 34.0-34.9, adult: Secondary | ICD-10-CM

## 2021-02-10 MED ORDER — OZEMPIC (0.25 OR 0.5 MG/DOSE) 2 MG/1.5ML ~~LOC~~ SOPN: 0.5000 mg | PEN_INJECTOR | SUBCUTANEOUS | 0 refills | Status: DC

## 2021-02-10 MED ORDER — ESCITALOPRAM OXALATE 20 MG PO TABS: 20.0000 mg | ORAL_TABLET | Freq: Every day | ORAL | 3 refills | Status: DC

## 2021-02-10 MED ORDER — ONDANSETRON 4 MG PO TBDP: 4.0000 mg | ORAL_TABLET | Freq: Three times a day (TID) | ORAL | 0 refills | Status: DC | PRN

## 2021-02-10 MED ORDER — VYVANSE 60 MG PO CHEW: 60.0000 mg | CHEWABLE_TABLET | Freq: Every day | ORAL | 0 refills | Status: DC

## 2021-02-11 NOTE — Progress Notes (Signed)
TeleHealth Visit:  Due to the COVID-19 pandemic, this visit was completed with telemedicine (audio/video) technology to reduce patient and provider exposure as well as to preserve personal protective equipment.   Debra Hernandez has verbally consented to this TeleHealth visit. The patient is located at home, the provider is located at the [facility] office. The participants in this visit include the listed provider and patient. The visit was conducted today via MyChart video.  OBESITY Debra Hernandez is here to discuss her progress with her obesity treatment plan along with follow-up of her obesity related diagnoses.   Today's visit was #: 14 Starting weight: 220 lbs Starting date: 10/24/2019  Interim History: Debra Hernandez is still nauseated for the first 2 days after injection.  She is on Ozempic 0.5 mg subcutaneously weekly.  She will try 0.25 mg every 4 days.  She says that Vyvanse is working well.  Nutrition Plan: keeping a food journal and adhering to recommended goals of 1300-1500 calories and 95 grams of protein.  Anti-obesity medications: Ozempic 0.5 mg subcutaneously weekly. Reported side effects: Nausea. Activity: Videos for 30 minutes 5 times per week.  Assessment/Plan:   1. IFG (impaired fasting glucose) with polyphagia Controlled. Current treatment: Ozempic 0.5 mg subcutaneously weekly.    Plan: Decrease Ozempic to 0.25 mg every 4 days due to nausea.  Continue Zofran 4 mg as needed for nausea.  She will continue to focus on protein-rich, low simple carbohydrate foods. We reviewed the importance of hydration, regular exercise for stress reduction, and restorative sleep.  - Decrease Semaglutide,0.25 or 0.5MG /DOS, (OZEMPIC, 0.25 OR 0.5 MG/DOSE,) 2 MG/1.5ML SOPN; Inject 0.5 mg into the skin once a week.  Dispense: 1.5 mL; Refill: 0 - Refill ondansetron (ZOFRAN-ODT) 4 MG disintegrating tablet; Take 1 tablet (4 mg total) by mouth every 8 (eight) hours as needed for nausea or  vomiting.  Dispense: 20 tablet; Refill: 0  2. Attention deficit hyperactivity disorder (ADHD), other type, with BED Controlled. Medication:Vyvanse 60 mg daily.  Counseling ADHD is the most missed diagnosis in relation to food and appetite problems. Often the strong urge to binge or to self-medicate with food subsides once the impulsivity and inattention of ADHD are treated. A person can experience a new ability to tune in to the body's signals, control cravings and improve impulse control.  A deficiency in norepinephrine and dopamine can lead to the following behaviors related to eating: Poor awareness of internal cues of hunger and satiety, or fullness. Inability to follow a meal plan. Inability to judge portion size accurately. Inability to stop bingeing or purging. Distraction by continual thoughts of food, weight and body shape. Increased desire to overeat, especially high calorie, "reward" type foods. Poor self-esteem due to repeated failures of self-control.  Plan: Continue Vyvanse 60 mg daily.  - Refill Lisdexamfetamine Dimesylate (VYVANSE) 60 MG CHEW; Chew 60 mg by mouth daily at 12 noon.  Dispense: 30 tablet; Refill: 0  I have consulted the [place] Controlled Substances Registry for this patient, and feel the risk/benefit ratio today is favorable for proceeding with this prescription for a controlled substance. The patient understands monitoring parameters and red flags.   3. Situational anxiety Debra Hernandez is taking Lexapro 20 mg for anxiety.   Plan:  Continue Lexapro at current dose.  Will send refill today.  - Refill escitalopram (LEXAPRO) 20 MG tablet; Take 1 tablet (20 mg total) by mouth daily.  Dispense: 90 tablet; Refill: 3  4. Obesity, current BMI 29.7  Debra Hernandez is currently in  the action stage of change. As such, her goal is to continue with weight loss efforts. She has agreed to keeping a food journal and adhering to recommended goals of 1300-1500 calories and 95 grams of  protein.   Exercise goals:  As is.  Behavioral modification strategies: increasing lean protein intake, decreasing simple carbohydrates, and increasing vegetables.  Debra Hernandez has agreed to follow-up with our clinic in 4 weeks. She was informed of the importance of frequent follow-up visits to maximize her success with intensive lifestyle modifications for her multiple health conditions.  Objective:   VITALS: Per patient if applicable, see vitals. GENERAL: Alert and in no acute distress. CARDIOPULMONARY: No increased WOB. Speaking in clear sentences.  PSYCH: Pleasant and cooperative. Speech normal rate and rhythm. Affect is appropriate. Insight and judgement are appropriate. Attention is focused, linear, and appropriate.  NEURO: Oriented as arrived to appointment on time with no prompting.   Lab Results  Component Value Date   CREATININE 0.59 12/24/2020   BUN 13 12/24/2020   NA 142 12/24/2020   K 4.2 12/24/2020   CL 104 12/24/2020   CO2 26 12/24/2020   Lab Results  Component Value Date   ALT 10 12/24/2020   AST 12 12/24/2020   ALKPHOS 65 12/24/2020   BILITOT 0.3 12/24/2020   Lab Results  Component Value Date   HGBA1C 5.4 05/14/2020   HGBA1C 5.3 08/29/2013   Lab Results  Component Value Date   INSULIN 8.0 12/24/2020   INSULIN 7.0 05/14/2020   Lab Results  Component Value Date   TSH 0.731 12/24/2020   Lab Results  Component Value Date   CHOL 148 12/24/2020   HDL 46 12/24/2020   LDLCALC 85 12/24/2020   TRIG 93 12/24/2020   CHOLHDL 3.2 12/24/2020   Lab Results  Component Value Date   WBC 5.4 12/24/2020   HGB 12.5 12/24/2020   HCT 37.2 12/24/2020   MCV 92 12/24/2020   PLT 275 12/24/2020   Lab Results  Component Value Date   IRON 73 12/24/2020   TIBC 250 12/24/2020   FERRITIN 33 12/24/2020   Attestation Statements:   Reviewed by clinician on day of visit: allergies, medications, problem list, medical history, surgical history, family history, social  history, and previous encounter notes.  I, Water quality scientist, CMA, am acting as transcriptionist for Briscoe Deutscher, DO  I have reviewed the above documentation for accuracy and completeness, and I agree with the above. - Briscoe Deutscher, DO, MS, FAAFP, DABOM - Family and Bariatric Medicine.

## 2021-02-19 ENCOUNTER — Encounter (INDEPENDENT_AMBULATORY_CARE_PROVIDER_SITE_OTHER): Payer: Self-pay | Admitting: Family Medicine

## 2021-02-19 ENCOUNTER — Encounter (INDEPENDENT_AMBULATORY_CARE_PROVIDER_SITE_OTHER): Payer: Self-pay

## 2021-02-19 DIAGNOSIS — R7301 Impaired fasting glucose: Secondary | ICD-10-CM

## 2021-03-02 ENCOUNTER — Encounter (INDEPENDENT_AMBULATORY_CARE_PROVIDER_SITE_OTHER): Payer: Self-pay

## 2021-03-02 MED ORDER — OZEMPIC (0.25 OR 0.5 MG/DOSE) 2 MG/1.5ML ~~LOC~~ SOPN: 0.5000 mg | PEN_INJECTOR | SUBCUTANEOUS | 0 refills | Status: DC

## 2021-03-10 ENCOUNTER — Encounter (INDEPENDENT_AMBULATORY_CARE_PROVIDER_SITE_OTHER): Payer: Self-pay | Admitting: Family Medicine

## 2021-03-10 ENCOUNTER — Ambulatory Visit (INDEPENDENT_AMBULATORY_CARE_PROVIDER_SITE_OTHER): Payer: Managed Care, Other (non HMO) | Admitting: Family Medicine

## 2021-03-10 ENCOUNTER — Other Ambulatory Visit: Payer: Self-pay

## 2021-03-10 VITALS — BP 99/65 | HR 79 | Temp 98.2°F | Ht 67.0 in | Wt 179.0 lb

## 2021-03-10 DIAGNOSIS — R7301 Impaired fasting glucose: Secondary | ICD-10-CM

## 2021-03-10 DIAGNOSIS — F418 Other specified anxiety disorders: Secondary | ICD-10-CM | POA: Diagnosis not present

## 2021-03-10 DIAGNOSIS — Z6834 Body mass index (BMI) 34.0-34.9, adult: Secondary | ICD-10-CM

## 2021-03-10 DIAGNOSIS — Z6828 Body mass index (BMI) 28.0-28.9, adult: Secondary | ICD-10-CM

## 2021-03-10 DIAGNOSIS — F908 Attention-deficit hyperactivity disorder, other type: Secondary | ICD-10-CM | POA: Diagnosis not present

## 2021-03-10 DIAGNOSIS — E6609 Other obesity due to excess calories: Secondary | ICD-10-CM

## 2021-03-10 DIAGNOSIS — E669 Obesity, unspecified: Secondary | ICD-10-CM | POA: Diagnosis not present

## 2021-03-10 MED ORDER — ONDANSETRON 4 MG PO TBDP: 4.0000 mg | ORAL_TABLET | Freq: Three times a day (TID) | ORAL | 2 refills | Status: AC | PRN

## 2021-03-10 MED ORDER — ESCITALOPRAM OXALATE 20 MG PO TABS: 20.0000 mg | ORAL_TABLET | Freq: Every day | ORAL | 3 refills | Status: DC

## 2021-03-10 MED ORDER — VYVANSE 60 MG PO CHEW: 60.0000 mg | CHEWABLE_TABLET | Freq: Every day | ORAL | 0 refills | Status: DC

## 2021-03-17 NOTE — Progress Notes (Signed)
Chief Complaint:   OBESITY Debra Hernandez is here to discuss her progress with her obesity treatment plan along with follow-up of her obesity related diagnoses. See Medical Weight Management Flowsheet for complete bioelectrical impedance results.  Today's visit was #: 15 Starting weight: 220 lbs Starting date: 10/24/2019 Weight change since last visit: 10 lbs Total lbs lost to date: 41 lbs Total weight loss percentage to date: -18.64%  Nutrition Plan: Keeping a food journal and adhering to recommended goals of 1300-1500 calories and 95 grams of protein daily for 85-95% of the time. Activity: Cardio/strength training for 30 minutes 5 times per week. Anti-obesity medications: Ozempic 0.25 mg subcutaneously weekly. Reported side effects: None.  Assessment/Plan:   1. IFG (impaired fasting glucose) with polyphagia Improving. Current treatment: Ozempic 0.25 mg subcutaneously weekly.    Plan:  Continue Ozempic 0.25 mg subcutaneously weekly.  Will also refill Zofran 4 mg every 8 hours as needed for nausea/vomiting. She will continue to focus on protein-rich, low simple carbohydrate foods. We reviewed the importance of hydration, regular exercise for stress reduction, and restorative sleep.  - Refill ondansetron (ZOFRAN-ODT) 4 MG disintegrating tablet; Take 1 tablet (4 mg total) by mouth every 8 (eight) hours as needed for nausea or vomiting.  Dispense: 20 tablet; Refill: 2  2. Attention deficit hyperactivity disorder (ADHD), other type, with BED Controlled. Medication:  Vyvanse 60 mg daily.  Counseling ADHD is the most missed diagnosis in relation to food and appetite problems. Often the strong urge to binge or to self-medicate with food subsides once the impulsivity and inattention of ADHD are treated. A person can experience a new ability to tune in to the body's signals, control cravings and improve impulse control.  A deficiency in norepinephrine and dopamine can lead to the following  behaviors related to eating:  Poor awareness of internal cues of hunger and satiety, or fullness. Inability to follow a meal plan. Inability to judge portion size accurately. Inability to stop bingeing or purging. Distraction by continual thoughts of food, weight and body shape. Increased desire to overeat, especially high calorie, "reward" type foods. Poor self-esteem due to repeated failures of self-control.  Plan: Continue Vyvanse 60 mg daily.  Refill sent to pharmacy today.  - Refill Lisdexamfetamine Dimesylate (VYVANSE) 60 MG CHEW; Chew 60 mg by mouth daily at 12 noon.  Dispense: 30 tablet; Refill: 0  I have consulted the [place] Controlled Substances Registry for this patient, and feel the risk/benefit ratio today is favorable for proceeding with this prescription for a controlled substance. The patient understands monitoring parameters and red flags.   3. Situational anxiety Debra Hernandez is taking Lexapro 20 mg for anxiety.   Plan:  Continue Lexapro at current dose.  Will refill today, as per below.  - Refill escitalopram (LEXAPRO) 20 MG tablet; Take 1 tablet (20 mg total) by mouth daily.  Dispense: 90 tablet; Refill: 3  4. Obesity, current BMI 28.1  Course: Debra Hernandez is currently in the action stage of change. As such, her goal is to continue with weight loss efforts.   Nutrition goals: She has agreed to keeping a food journal and adhering to recommended goals of 1300-1500 calories and 95 grams of protein.   Exercise goals:  As is.  Behavioral modification strategies: increasing lean protein intake, decreasing simple carbohydrates, increasing vegetables, and increasing water intake.  Debra Hernandez has agreed to follow-up with our clinic in 8 weeks. She was informed of the importance of frequent follow-up visits to maximize her success with  intensive lifestyle modifications for her multiple health conditions.   Objective:   Blood pressure 99/65, pulse 79, temperature 98.2 F (36.8 C),  temperature source Oral, height '5\' 7"'$  (1.702 m), weight 179 lb (81.2 kg), last menstrual period 03/23/2013, SpO2 99 %. Body mass index is 28.04 kg/m.  General: Cooperative, alert, well developed, in no acute distress. HEENT: Conjunctivae and lids unremarkable. Cardiovascular: Regular rhythm.  Lungs: Normal work of breathing. Neurologic: No focal deficits.   Lab Results  Component Value Date   CREATININE 0.59 12/24/2020   BUN 13 12/24/2020   NA 142 12/24/2020   K 4.2 12/24/2020   CL 104 12/24/2020   CO2 26 12/24/2020   Lab Results  Component Value Date   ALT 10 12/24/2020   AST 12 12/24/2020   ALKPHOS 65 12/24/2020   BILITOT 0.3 12/24/2020   Lab Results  Component Value Date   HGBA1C 5.4 05/14/2020   HGBA1C 5.3 08/29/2013   Lab Results  Component Value Date   INSULIN 8.0 12/24/2020   INSULIN 7.0 05/14/2020   Lab Results  Component Value Date   TSH 0.731 12/24/2020   Lab Results  Component Value Date   CHOL 148 12/24/2020   HDL 46 12/24/2020   LDLCALC 85 12/24/2020   TRIG 93 12/24/2020   CHOLHDL 3.2 12/24/2020   Lab Results  Component Value Date   VD25OH 21.0 (L) 12/24/2020   VD25OH 28.3 (L) 08/13/2020   VD25OH 22.9 (L) 05/14/2020   Lab Results  Component Value Date   WBC 5.4 12/24/2020   HGB 12.5 12/24/2020   HCT 37.2 12/24/2020   MCV 92 12/24/2020   PLT 275 12/24/2020   Lab Results  Component Value Date   IRON 73 12/24/2020   TIBC 250 12/24/2020   FERRITIN 33 12/24/2020   Attestation Statements:   Reviewed by clinician on day of visit: allergies, medications, problem list, medical history, surgical history, family history, social history, and previous encounter notes.  I, Water quality scientist, CMA, am acting as transcriptionist for Briscoe Deutscher, DO  I have reviewed the above documentation for accuracy and completeness, and I agree with the above. -  Briscoe Deutscher, DO, MS, FAAFP, DABOM - Family and Bariatric Medicine.

## 2021-04-19 ENCOUNTER — Other Ambulatory Visit (INDEPENDENT_AMBULATORY_CARE_PROVIDER_SITE_OTHER): Payer: Self-pay | Admitting: Family Medicine

## 2021-04-19 DIAGNOSIS — F908 Attention-deficit hyperactivity disorder, other type: Secondary | ICD-10-CM

## 2021-04-20 NOTE — Telephone Encounter (Signed)
Please review

## 2021-04-21 ENCOUNTER — Telehealth (INDEPENDENT_AMBULATORY_CARE_PROVIDER_SITE_OTHER): Payer: Self-pay

## 2021-04-21 NOTE — Telephone Encounter (Signed)
Please advise 

## 2021-04-21 NOTE — Telephone Encounter (Signed)
Pt contacted and notified that we are unable to refill her Vyvanse, per Dr.Beasley, however it is safe to discontinue the medication until she can be seen by Dr.Wallace. Pt was also advised that we are happy to see her for weight management during the transition. Pt verbalized understanding. ?

## 2021-08-12 ENCOUNTER — Encounter (INDEPENDENT_AMBULATORY_CARE_PROVIDER_SITE_OTHER): Payer: Self-pay

## 2022-01-19 ENCOUNTER — Ambulatory Visit: Payer: Managed Care, Other (non HMO) | Admitting: Dermatology

## 2022-01-19 ENCOUNTER — Encounter: Payer: Self-pay | Admitting: Dermatology

## 2022-01-19 DIAGNOSIS — L989 Disorder of the skin and subcutaneous tissue, unspecified: Secondary | ICD-10-CM

## 2022-01-19 MED ORDER — MUPIROCIN 2 % EX OINT: TOPICAL_OINTMENT | CUTANEOUS | 2 refills | Status: DC

## 2022-01-19 NOTE — Progress Notes (Signed)
   Follow-Up Visit   Subjective  Debra Hernandez is a 37 y.o. female who presents for the following: Skin Problem (Patient c/o scabs on her scalp x 2 months, treating with Hydrocortisone cream, otc Vaseline etc with a poor response. Patient has a hx of Alopecia on her scalp in this area. ).   The following portions of the chart were reviewed this encounter and updated as appropriate:   Tobacco  Allergies  Meds  Problems  Med Hx  Surg Hx  Fam Hx      Review of Systems:  No other skin or systemic complaints except as noted in HPI or Assessment and Plan.  Objective  Well appearing patient in no apparent distress; mood and affect are within normal limits.  A focused examination was performed including scalp. Relevant physical exam findings are noted in the Assessment and Plan.  crown of scalp Dry scale crust, no sign of inflammation     Assessment & Plan  Skin erosion crown of scalp  No primary lesion appreciated.  Pt has history of positive ANA. Advised no rash visible today.  Start Mupirocin ointment apply to scalp tid  Start otc Aquaphor ointment apply to scalp as needed to keep scalp moist   Recommend N-acetylcysteine (NAC) 600 mg supplement three times per day to help with scratching/picking   Avoid touching scabs as much as possible    Related Medications mupirocin ointment (BACTROBAN) 2 % Apply to scalp tid   Return in about 3 months (around 04/20/2022) for scabs .  I, Marye Round, CMA, am acting as scribe for Forest Gleason, MD .   Documentation: I have reviewed the above documentation for accuracy and completeness, and I agree with the above.  Forest Gleason, MD

## 2022-01-19 NOTE — Patient Instructions (Addendum)
Recommend N-acetylcysteine (NAC) 500-600 mg supplement three times per day to help with picking  Due to recent changes in healthcare laws, you may see results of your pathology and/or laboratory studies on MyChart before the doctors have had a chance to review them. We understand that in some cases there may be results that are confusing or concerning to you. Please understand that not all results are received at the same time and often the doctors may need to interpret multiple results in order to provide you with the best plan of care or course of treatment. Therefore, we ask that you please give [place] 2 business days to thoroughly review all your results before contacting the office for clarification. Should we see a critical lab result, you will be contacted sooner.   If You Need Anything After Your Visit  If you have any questions or concerns for your doctor, please call our main line at [phone] and press option 4 to reach your doctor's medical assistant. If no one answers, please leave a voicemail as directed and we will return your call as soon as possible. Messages left after 4 pm will be answered the following business day.   You may also send [place] a message via [place]. We typically respond to MyChart messages within 1-2 business days.  For prescription refills, please ask your pharmacy to contact our office. Our fax number is [phone].  If you have an urgent issue when the clinic is closed that cannot wait until the next business day, you can page your doctor at the number below.    Please note that while we do our best to be available for urgent issues outside of office hours, we are not available 24/7.   If you have an urgent issue and are unable to reach [place], you may choose to seek medical care at your doctor's office, retail clinic, urgent care center, or emergency room.  If you have a medical emergency, please immediately call 911 or go to the emergency department.  Pager  Numbers  - Dr. Nehemiah Massed: [phone]  - Dr. Laurence Ferrari: [phone]  - Dr. Nicole Kindred: [phone]  In the event of inclement weather, please call our main line at [phone] for an update on the status of any delays or closures.  Dermatology Medication Tips: Please keep the boxes that topical medications come in in order to help keep track of the instructions about where and how to use these. Pharmacies typically print the medication instructions only on the boxes and not directly on the medication tubes.   If your medication is too expensive, please contact our office at [phone] option 4 or send [place] a message through [place].   We are unable to tell what your co-pay for medications will be in advance as this is different depending on your insurance coverage. However, we may be able to find a substitute medication at lower cost or fill out paperwork to get insurance to cover a needed medication.   If a prior authorization is required to get your medication covered by your insurance company, please allow [place] 1-2 business days to complete this process.  Drug prices often vary depending on where the prescription is filled and some pharmacies may offer cheaper prices.  The website www.goodrx.com contains coupons for medications through different pharmacies. The prices here do not account for what the cost may be with help from insurance (it may be cheaper with your insurance), but the website can give you the price if you did not  use any insurance.  - You can print the associated coupon and take it with your prescription to the pharmacy.  - You may also stop by our office during regular business hours and pick up a GoodRx coupon card.  - If you need your prescription sent electronically to a different pharmacy, notify our office through [facility] or by phone at [phone] option 4.     Si Usted Necesita Algo Despus de Su Visita  Tambin puede enviarnos un mensaje a travs de  Pharmacist, community. Por lo general respondemos a los mensajes de MyChart en el transcurso de 1 a 2 das hbiles.  Para renovar recetas, por favor pida a su farmacia que se ponga en contacto con nuestra oficina. Harland Dingwall de fax es [place] [phone].  Si tiene un asunto urgente cuando la clnica est cerrada y que no puede esperar hasta el siguiente da hbil, puede llamar/localizar a su doctor(a) al nmero que aparece a continuacin.   Por favor, tenga en cuenta que aunque hacemos todo lo posible para estar disponibles para asuntos urgentes fuera del horario de [place], no estamos disponibles las 24 horas del da, los 7 das de la [place].   Si tiene un problema urgente y no puede comunicarse con nosotros, puede optar por buscar atencin mdica  en el consultorio de su doctor(a), en una clnica privada, en un centro de atencin urgente o en una sala de emergencias.  Si tiene Engineering geologist, por favor llame inmediatamente al 911 o vaya a la sala de emergencias.  Nmeros de bper  - Dr. Nehemiah Massed: [phone]  - Dra. Moye: [phone]  - Dra. Nicole Kindred: [phone]  En caso de inclemencias del [place], por favor llame a Johnsie Kindred principal al [phone] para una actualizacin sobre el [place] de cualquier retraso o cierre.  Consejos para la medicacin en dermatologa: Por favor, guarde las cajas en las que vienen los medicamentos de uso tpico para ayudarle a seguir las instrucciones sobre dnde y cmo usarlos. Las farmacias generalmente imprimen las instrucciones del medicamento slo en las cajas y no directamente en los tubos del [place].   Si su medicamento es muy caro, por favor, pngase en contacto con Zigmund Daniel llamando al [phone] y presione la opcin 4 o envenos un mensaje a travs de Pharmacist, community.   No podemos decirle cul ser su copago por los medicamentos por adelantado ya que esto es diferente dependiendo de la cobertura de su seguro. Sin embargo, es posible que  podamos encontrar un medicamento sustituto a Electrical engineer un formulario para que el seguro cubra el medicamento que se considera necesario.   Si se requiere una autorizacin previa para que su compaa de seguros [place] su medicamento, por favor permtanos de 1 a 2 das hbiles para completar este proceso.  Los precios de los medicamentos varan con frecuencia dependiendo del Environmental consultant de dnde se surte la receta y alguna farmacias pueden ofrecer precios ms baratos.  El sitio web www.goodrx.com tiene cupones para medicamentos de Airline pilot. Los precios aqu no tienen en cuenta lo que podra costar con la ayuda del seguro (puede ser ms barato con su seguro), pero el sitio web puede darle el precio si no utiliz Research scientist (physical sciences).  - Puede imprimir el cupn correspondiente y llevarlo con su receta a la farmacia.  - Tambin puede pasar por nuestra oficina durante el horario de atencin regular y Charity fundraiser una tarjeta de cupones de GoodRx.  - Si necesita que su receta se enve electrnicamente  a [employer], informe a nuestra oficina a travs de MyChart de [place] o por telfono llamando al [phone] y presione la opcin 4.

## 2022-04-21 ENCOUNTER — Ambulatory Visit: Payer: Managed Care, Other (non HMO) | Admitting: Dermatology

## 2022-09-21 ENCOUNTER — Encounter: Payer: Self-pay | Admitting: Family Medicine

## 2022-09-28 ENCOUNTER — Encounter: Payer: Self-pay | Admitting: Internal Medicine

## 2022-09-28 ENCOUNTER — Inpatient Hospital Stay: Payer: Managed Care, Other (non HMO)

## 2022-09-28 ENCOUNTER — Other Ambulatory Visit: Payer: Self-pay

## 2022-09-28 ENCOUNTER — Inpatient Hospital Stay: Payer: Managed Care, Other (non HMO) | Attending: Internal Medicine | Admitting: Internal Medicine

## 2022-09-28 VITALS — BP 101/70 | HR 77 | Temp 96.4°F | Ht 67.0 in | Wt 163.0 lb

## 2022-09-28 DIAGNOSIS — Z8042 Family history of malignant neoplasm of prostate: Secondary | ICD-10-CM | POA: Insufficient documentation

## 2022-09-28 DIAGNOSIS — E538 Deficiency of other specified B group vitamins: Secondary | ICD-10-CM | POA: Insufficient documentation

## 2022-09-28 DIAGNOSIS — R790 Abnormal level of blood mineral: Secondary | ICD-10-CM

## 2022-09-28 DIAGNOSIS — Z90710 Acquired absence of both cervix and uterus: Secondary | ICD-10-CM | POA: Insufficient documentation

## 2022-09-28 DIAGNOSIS — Z79899 Other long term (current) drug therapy: Secondary | ICD-10-CM | POA: Insufficient documentation

## 2022-09-28 DIAGNOSIS — D509 Iron deficiency anemia, unspecified: Secondary | ICD-10-CM | POA: Insufficient documentation

## 2022-09-28 DIAGNOSIS — Z8041 Family history of malignant neoplasm of ovary: Secondary | ICD-10-CM | POA: Diagnosis not present

## 2022-09-28 DIAGNOSIS — Z832 Family history of diseases of the blood and blood-forming organs and certain disorders involving the immune mechanism: Secondary | ICD-10-CM | POA: Diagnosis not present

## 2022-09-28 DIAGNOSIS — Z803 Family history of malignant neoplasm of breast: Secondary | ICD-10-CM | POA: Insufficient documentation

## 2022-09-28 LAB — IRON AND TIBC
Iron: 91 ug/dL (ref 28–170)
Saturation Ratios: 34 % — ABNORMAL HIGH (ref 10.4–31.8)
TIBC: 272 ug/dL (ref 250–450)
UIBC: 181 ug/dL

## 2022-09-28 LAB — VITAMIN B12: Vitamin B-12: 130 pg/mL — ABNORMAL LOW (ref 180–914)

## 2022-09-28 LAB — FERRITIN: Ferritin: 13 ng/mL (ref 11–307)

## 2022-09-28 NOTE — Progress Notes (Signed)
[facility]  [phone] Fax:([phone]  ID: Kathrynn Speed OB: [birth year]  MR#: [id number]  GLO#:[id number]  Patient Care Team: Reine Just as PCP - General (Family Medicine) Rosezetta Schlatter Alessandra Bevels, PA-C as Physician Assistant (Family Medicine) Lemar Livings Merrily Pew, MD as Consulting Physician (General Surgery) Michaelyn Barter, MD as Consulting Physician (Oncology)  REFERRING PROVIDER: Helane Rima  REASON FOR REFERRAL: Iron deficiency anemia  HPI: Debra Hernandez is a 37 y.o. female with past medical history of IBS, PCOS, anxiety was referred to hematology for management of iron deficiency anemia.  Patient has longstanding history of iron deficiency.  She is on gentle iron for 1+ year.  Not able to tolerate well due to upset stomach.  History of hysterectomy in 2015.  Denies any bleeding in urine or stools.  Had colonoscopy in December 2016 with Dr. Daleen Squibb for sensitivity to food and diarrhea.  Showed nonbleeding hemorrhoids.  Has chronic vitamin B12 deficiency.  Has previously tried monthly and then weekly B12 injection but her B12 level was still low.  In past few week, she is taking injections 3-4 times a week.  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  Was started on Plaquenil in September 2024.  Family history of hereditary hemochromatosis in father.  Labs reviewed from 08/09/2022.  WBC 4.7, hemoglobin 12.6, platelets 221.  Ferritin 10, saturation 24%, iron 70, vitamin B12 222 From April 2024.  Ferritin was 13.  REVIEW OF SYSTEMS:   ROS  As per HPI. Otherwise, a complete review of systems is negative.  PAST MEDICAL HISTORY: Past Medical History:  Diagnosis Date   Allergies    Anemia    H/O. no current issues   Anxiety    Blood transfusion 5/11   2 units bld transfused at [facility]   BRCA negative 2015   Depression    Eczema    History of seasonal allergies    IBS (irritable bowel  syndrome)    Kidney problem    Kidney stone    passsed stone - no surgery required   Melanoma (HCC) 2003   on back - used local to removed   Polycystic ovary syndrome    PONV (postoperative nausea and vomiting)    Swallowing difficulty    Wears contact lenses     PAST SURGICAL HISTORY: Past Surgical History:  Procedure Laterality Date   ABDOMINAL HYSTERECTOMY  2015   ABDOMINOPLASTY     CESAREAN SECTION  05/2009   at 39 wks at [facility],  lived only 12 days   CESAREAN SECTION  02/02/2011   Procedure: CESAREAN SECTION;  Surgeon: Reva Bores, MD;  Location: [facility];  Service: Gynecology;  Laterality: N/A;  Repeart c/section   COLONOSCOPY WITH PROPOFOL N/A 12/20/2014   Procedure: COLONOSCOPY WITH PROPOFOL;  Surgeon: Midge Minium, MD;  Location: [facility];  Service: Endoscopy;  Laterality: N/A;   DIAGNOSTIC LAPAROSCOPY     ovarian cyst removed   LAPAROSCOPIC BILATERAL SALPINGECTOMY Bilateral 07/20/2012   Procedure: LAPAROSCOPIC BILATERAL SALPINGECTOMY;  Surgeon: Tereso Newcomer, MD;  Location: [facility];  Service: Gynecology;  Laterality: Bilateral;   LAPAROSCOPIC HYSTERECTOMY Left 04/18/2013   Procedure: HYSTERECTOMY TOTAL LAPAROSCOPIC and  left salpingectomy;  Surgeon: Reva Bores, MD;  Location: [facility];  Service: Gynecology;  Laterality: Left;   laporascopy     NOVASURE ABLATION N/A 07/20/2012   Procedure: NOVASURE ABLATION;  Surgeon: Tereso Newcomer, MD;  Location: [facility];  Service: Gynecology;  Laterality:  N/A;   OVARIAN CYST REMOVAL     SCAR REVISION  02/02/2011   Procedure: SCAR REVISION;  Surgeon: Reva Bores, MD;  Location: [facility];  Service: Gynecology;  Laterality: N/A;   SVD  04/2006   [facility] 1 [facility]   TONSILLECTOMY     TONSILLECTOMY AND ADENOIDECTOMY     WISDOM TOOTH EXTRACTION     2 lower extracted    FAMILY HISTORY: Family History  Problem Relation Age of Onset   Cancer Mother 43       Ovarian   Parkinson's disease Mother     Mental illness Mother    Depression Mother    Thyroid disease Mother    Anxiety disorder Mother    Cancer Maternal Grandmother        Ovarian   Parkinson's disease Maternal Grandmother    Diabetes Maternal Grandfather    Cancer Father 33       prostate   Hypertension Father    Hyperlipidemia Father    Cancer Paternal Aunt 88       Breast   Anesthesia problems Neg Hx    Hypotension Neg Hx    Malignant hyperthermia Neg Hx    Pseudochol deficiency Neg Hx     HEALTH MAINTENANCE: Social History   Tobacco Use   Smoking status: Never   Smokeless tobacco: Never  Substance Use Topics   Alcohol use: Yes    Comment: occasionally   Drug use: No     Allergies  Allergen Reactions   Atomoxetine Other (See Comments)   Monistat [Miconazole] Swelling    Swelling shut in the vaginal area per patient   Strattera [Atomoxetine Hcl] Nausea And Vomiting and Other (See Comments)    insomnia   Wound Dressing Adhesive Rash    Current Outpatient Medications  Medication Sig Dispense Refill   BIOTIN PO Take by mouth.     COLLAGEN PO Take by mouth.     cyanocobalamin (,VITAMIN B-12,) 1000 MCG/ML injection Inject weekly x 4 weeks, then every 30 days after 10 mL 0   escitalopram (LEXAPRO) 20 MG tablet Take 1 tablet (20 mg total) by mouth daily. 90 tablet 3   Fe Bisgly-Vit C-Vit B12-FA (GENTLE IRON PO)      hydroxychloroquine (PLAQUENIL) 200 MG tablet Take 200 mg by mouth daily.     Lactobacillus-Inulin (CULTURELLE DIGESTIVE DAILY PO) Take 1 tablet by mouth daily.     levocetirizine (XYZAL) 5 MG tablet Take 5 mg by mouth every evening.     Lisdexamfetamine Dimesylate (VYVANSE) 60 MG CHEW Chew 60 mg by mouth daily at 12 noon. 30 tablet 0   ondansetron (ZOFRAN-ODT) 4 MG disintegrating tablet Take 1 tablet (4 mg total) by mouth every 8 (eight) hours as needed for nausea or vomiting. 20 tablet 2   polyvinyl alcohol (LIQUIFILM TEARS) 1.4 % ophthalmic solution Place 1 drop into both eyes as needed for  dry eyes. Reported on 06/18/2015     SYRINGE-NEEDLE, DISP, 3 ML 25G X 1" 3 ML MISC Use to give b12 100 each 0   tirzepatide (MOUNJARO) 5 MG/0.5ML Pen Inject 5 mg into the skin once a week.     valACYclovir (VALTREX) 1000 MG tablet TAKE 2 TABLETS BY MOUTH TWICE A DAY FOR 1 DAY AT ONSET OF COLD SORE. MAY REPEAT AS NECESSARY 120 tablet 1   Vitamin D, Ergocalciferol, (DRISDOL) 1.25 MG (50000 UNIT) CAPS capsule TAKE 1 CAPSULE BY MOUTH EVERY 7 DAYS 12 capsule  0   clobetasol (OLUX) 0.05 % topical foam Apply topically 2 (two) times daily. To affected areas at scalp. Avoid applying to face, groin, and axilla. Use as directed. Risk of skin atrophy with long-term use reviewed. 50 g 3   mometasone (ELOCON) 0.1 % cream Apply twice daily to hands as needed for eczema flares. Avoid applying to face, groin, and axilla. Use as directed. Risk of skin atrophy with long-term use reviewed. 45 g 2   mupirocin ointment (BACTROBAN) 2 % Apply to scalp tid 22 g 2   propranolol (INDERAL) 20 MG tablet Take 20 mg by mouth 3 (three) times daily.     Semaglutide,0.25 or 0.5MG /DOS, (OZEMPIC, 0.25 OR 0.5 MG/DOSE,) 2 MG/1.5ML SOPN Inject 0.5 mg into the skin once a week. 4.5 mL 0   No current facility-administered medications for this visit.    OBJECTIVE: Vitals:   09/28/22 1358  BP: 101/70  Pulse: 77  Temp: (!) 96.4 F (35.8 C)     Body mass index is 25.53 kg/m.      General: Well-developed, well-nourished, no acute distress. Eyes: Pink conjunctiva, anicteric sclera. HEENT: Normocephalic, moist mucous membranes, clear oropharnyx. Lungs: Clear to auscultation bilaterally. Heart: Regular rate and rhythm. No rubs, murmurs, or gallops. Abdomen: Soft, nontender, nondistended. No organomegaly noted, normoactive bowel sounds. Musculoskeletal: No edema, cyanosis, or clubbing. Neuro: Alert, answering all questions appropriately. Cranial nerves grossly intact. Skin: No rashes or petechiae noted. Psych: Normal  affect. Lymphatics: No cervical, calvicular, axillary or inguinal LAD.   LAB RESULTS:  Lab Results  Component Value Date   NA 142 12/24/2020   K 4.2 12/24/2020   CL 104 12/24/2020   CO2 26 12/24/2020   GLUCOSE 76 12/24/2020   BUN 13 12/24/2020   CREATININE 0.59 12/24/2020   CALCIUM 9.1 12/24/2020   PROT 6.3 12/24/2020   ALBUMIN 4.2 12/24/2020   AST 12 12/24/2020   ALT 10 12/24/2020   ALKPHOS 65 12/24/2020   BILITOT 0.3 12/24/2020   GFRNONAA >60 05/10/2020   GFRAA 127 01/15/2020    Lab Results  Component Value Date   WBC 5.4 12/24/2020   NEUTROABS 2.9 12/24/2020   HGB 12.5 12/24/2020   HCT 37.2 12/24/2020   MCV 92 12/24/2020   PLT 275 12/24/2020    Lab Results  Component Value Date   TIBC 250 12/24/2020   TIBC 270 08/13/2020   TIBC 278 05/14/2020   FERRITIN 33 12/24/2020   FERRITIN 25 08/13/2020   FERRITIN 26 05/14/2020   IRONPCTSAT 29 12/24/2020   IRONPCTSAT 17 08/13/2020   IRONPCTSAT 14 (L) 05/14/2020     STUDIES: No results found.  ASSESSMENT AND PLAN:   Debra Hernandez is a 37 y.o. female with pmh of IBS, PCOS, anxiety was referred to hematology for management of iron deficiency anemia.  # Iron deficiency -Labs reviewed from August 2024.  Hemoglobin 12.  Ferritin 10 -On gentle iron for more than a year.  Difficulty tolerating-upset stomach. -Discussed about IV Venofer 200 mg weekly x 5 doses.  Occasional side effects such as nausea, back pain and chest pain was discussed.  Rarely can cause allergic reaction. -History of hysterectomy in 2015.  Colonoscopy in December 2016 with Dr. Servando Snare which was negative. -GI referral has been placed by her rheumatologist. -Check celiac panel to rule out malabsorption  # Vitamin B12 deficiency -B12 level from April 2024 135. 8/24 -222 -Previously was taking B12 injection once a month then once every week and now 3-4 times a  week per primary care. -Check B12 level today.  Will adjust the dose depending on the  level. -Check pernicious anemia panel -Advised to add oral vitamin B12 1000 mcg once daily to cut down injection needs.  # Undifferentiated connective tissue disorder -[facility].  -On Plaquenil  Orders Placed This Encounter  Procedures   Vitamin B12   Celiac Disease Panel   Vitamin B12 Deficiency Cascade   Iron and TIBC   Ferritin   Iron and TIBC   Ferritin   Vitamin B12   Anti-parietal antibody   Intrinsic Factor Antibodies   Schedule for IV Venofer 200 mg weekly x 5 doses RTC in 3 months for MD visit, labs, possible Venofer  Patient expressed understanding and was in agreement with this plan. She also understands that She can call clinic at any time with any questions, concerns, or complaints.   I spent a total of 45 minutes reviewing chart data, face-to-face evaluation with the patient, counseling and coordination of care as detailed above.  Michaelyn Barter, MD   09/28/2022 2:22 PM

## 2022-09-28 NOTE — Patient Instructions (Signed)
Start vitamin b12 1000 mcg orally once daily. Available over the counter.

## 2022-09-28 NOTE — Progress Notes (Signed)
Patient with family history of BRCA positive mutation in her mom and maternal grandmother.  States she is BRCA negative.  States she was told by her dad's MD to inform us that he has hemachromatosis.  Patient complains of joint pain.

## 2022-09-28 NOTE — Addendum Note (Signed)
Addended byMichaelyn Barter on: 09/28/2022 03:48 PM   Modules accepted: Orders

## 2022-09-29 LAB — ANTI-PARIETAL ANTIBODY: Parietal Cell Antibody-IgG: 153.8 Units — ABNORMAL HIGH (ref 0.0–20.0)

## 2022-09-29 LAB — CELIAC DISEASE PANEL
Endomysial Ab, IgA: POSITIVE — AB
IgA: 190 mg/dL (ref 87–352)
Tissue Transglutaminase Ab, IgA: 28 U/mL — ABNORMAL HIGH (ref 0–3)

## 2022-09-29 LAB — INTRINSIC FACTOR ANTIBODIES: Intrinsic Factor: 1.1 AU/mL (ref 0.0–1.1)

## 2022-10-05 ENCOUNTER — Ambulatory Visit: Payer: Managed Care, Other (non HMO)

## 2022-10-06 ENCOUNTER — Inpatient Hospital Stay (HOSPITAL_BASED_OUTPATIENT_CLINIC_OR_DEPARTMENT_OTHER): Payer: Managed Care, Other (non HMO) | Admitting: Nurse Practitioner

## 2022-10-06 ENCOUNTER — Inpatient Hospital Stay: Payer: Managed Care, Other (non HMO) | Attending: Internal Medicine

## 2022-10-06 VITALS — BP 99/62 | HR 76 | Temp 97.3°F | Resp 16

## 2022-10-06 DIAGNOSIS — D509 Iron deficiency anemia, unspecified: Secondary | ICD-10-CM | POA: Diagnosis present

## 2022-10-06 DIAGNOSIS — R790 Abnormal level of blood mineral: Secondary | ICD-10-CM

## 2022-10-06 DIAGNOSIS — L299 Pruritus, unspecified: Secondary | ICD-10-CM

## 2022-10-06 DIAGNOSIS — T50905A Adverse effect of unspecified drugs, medicaments and biological substances, initial encounter: Secondary | ICD-10-CM

## 2022-10-06 MED ORDER — SODIUM CHLORIDE 0.9 % IV SOLN
Freq: Once | INTRAVENOUS | Status: DC | PRN
  Filled 2022-10-06: qty 250

## 2022-10-06 MED ORDER — HYDROXYZINE HCL 10 MG PO TABS
10.0000 mg | ORAL_TABLET | Freq: Three times a day (TID) | ORAL | 0 refills | Status: DC | PRN
Start: 2022-10-06 — End: 2023-11-30

## 2022-10-06 MED ORDER — SODIUM CHLORIDE 0.9 % IV SOLN
Freq: Once | INTRAVENOUS | Status: AC
  Filled 2022-10-06: qty 250

## 2022-10-06 MED ORDER — SODIUM CHLORIDE 0.9 % IV SOLN
200.0000 mg | Freq: Once | INTRAVENOUS | Status: AC
  Administered 2022-10-06: 200 mg via INTRAVENOUS
  Filled 2022-10-06: qty 200

## 2022-10-06 MED ORDER — FAMOTIDINE IN NACL 20-0.9 MG/50ML-% IV SOLN
20.0000 mg | Freq: Once | INTRAVENOUS | Status: AC | PRN
  Administered 2022-10-06: 20 mg via INTRAVENOUS

## 2022-10-06 NOTE — Progress Notes (Signed)
Hypersensitivity Reaction note  Date of event: 10/06/22 Time of event: 0935 Generic name of drug involved: Iron Sucrose Name of provider notified of the hypersensitivity reaction: Consuello Masse, NP Was agent that likely caused hypersensitivity reaction added to Allergies List within EMR? Yes  Chain of events including reaction signs/symptoms, treatment administered, and outcome (e.g., drug resumed; drug discontinued; sent to Emergency Department; etc.)   Patient received first dose of Iron Sucrose (Venofer) without incident. In the 30 minutes post infusion observation period, patient began itching all over, but no rash. NP came to chairside. Pepcid IV and IV given per NP order. Patient observed for 60 minutes. Itching continued, but no rash noted. VSS. Patient instructed to take an antihistamine at home prior to next iron infusion, per Consuello Masse, NP. Patient instructed on the signs and symptoms of allergic reaction and when to call 911, or go to the ED. Patient verbalized understanding. Patient discharged in ambulatory condition.  Adora Fridge, RN 10/06/2022 12:08 PM

## 2022-10-06 NOTE — Progress Notes (Signed)
[facility]pt[facility]  [facility] A Department of the [place]. [facility] [address], [address] [place], [place] [address] [phone] (phone) [phone] (fax)  Patient Care Team: Margaretann Loveless, PA-C as PCP - General (Family Medicine) Rosezetta Schlatter Alessandra Bevels, PA-C as Physician Assistant (Family Medicine) Lemar Livings, Merrily Pew, MD as Consulting Physician (General Surgery) Michaelyn Barter, MD as Consulting Physician (Oncology)   Name of the patient: Debra Hernandez  [id number]  July 25, 1985   Date of visit: 10/06/22  Chief complaint/ Reason for visit- medication reaction  History of Presenting Illness- Patient completed her venofer infusion and reported itching all over. Symptoms started spontaneously. No wheezing, cough, or shortness of breath. No rash, palpitations.   Review of systems- Review of Systems  HENT:  Negative for sore throat.   Respiratory:  Negative for cough, shortness of breath, wheezing and stridor.   Cardiovascular:  Negative for chest pain and palpitations.  Gastrointestinal:  Negative for abdominal pain and nausea.  Skin:  Positive for itching. Negative for rash.  Neurological:  Negative for dizziness, sensory change, speech change, focal weakness and headaches.     Allergies  Allergen Reactions   Atomoxetine Other (See Comments)   Monistat [Miconazole] Swelling    Swelling shut in the vaginal area per patient   Strattera [Atomoxetine Hcl] Nausea And Vomiting and Other (See Comments)    insomnia   Wound Dressing Adhesive Rash   Past Medical History:  Diagnosis Date   Allergies    Anemia    H/O. no current issues   Anxiety    Blood transfusion 5/11   2 units bld transfused at [facility]   BRCA negative 2015   Depression    Eczema    History of seasonal allergies    IBS (irritable bowel syndrome)    Kidney problem    Kidney stone    passsed stone - no surgery required    Melanoma (HCC) 2003   on back - used local to removed   Polycystic ovary syndrome    PONV (postoperative nausea and vomiting)    Swallowing difficulty    Wears contact lenses     Family History  Problem Relation Age of Onset   Cancer Mother 29       Ovarian   Parkinson's disease Mother    Mental illness Mother    Depression Mother    Thyroid disease Mother    Anxiety disorder Mother    Cancer Maternal Grandmother        Ovarian   Parkinson's disease Maternal Grandmother    Diabetes Maternal Grandfather    Cancer Father 59       prostate   Hypertension Father    Hyperlipidemia Father    Cancer Paternal Aunt 46       Breast   Anesthesia problems Neg Hx    Hypotension Neg Hx    Malignant hyperthermia Neg Hx    Pseudochol deficiency Neg Hx     Current Outpatient Medications:    hydrOXYzine (ATARAX) 10 MG tablet, Take 1 tablet (10 mg total) by mouth 3 (three) times daily as needed for itching. May cause drowsiness., Disp: 20 tablet, Rfl: 0   BIOTIN PO, Take by mouth., Disp: , Rfl:    clobetasol (OLUX) 0.05 % topical foam, Apply topically 2 (two) times daily. To affected areas at scalp. Avoid applying to face, groin, and axilla. Use as directed. Risk of skin atrophy with long-term use reviewed.,  Disp: 50 g, Rfl: 3   COLLAGEN PO, Take by mouth., Disp: , Rfl:    cyanocobalamin (,VITAMIN B-12,) 1000 MCG/ML injection, Inject weekly x 4 weeks, then every 30 days after, Disp: 10 mL, Rfl: 0   escitalopram (LEXAPRO) 20 MG tablet, Take 1 tablet (20 mg total) by mouth daily., Disp: 90 tablet, Rfl: 3   Fe Bisgly-Vit C-Vit B12-FA (GENTLE IRON PO), , Disp: , Rfl:    hydroxychloroquine (PLAQUENIL) 200 MG tablet, Take 200 mg by mouth daily., Disp: , Rfl:    Lactobacillus-Inulin (CULTURELLE DIGESTIVE DAILY PO), Take 1 tablet by mouth daily., Disp: , Rfl:    levocetirizine (XYZAL) 5 MG tablet, Take 5 mg by mouth every evening., Disp: , Rfl:    Lisdexamfetamine Dimesylate (VYVANSE) 60 MG CHEW,  Chew 60 mg by mouth daily at 12 noon., Disp: 30 tablet, Rfl: 0   mometasone (ELOCON) 0.1 % cream, Apply twice daily to hands as needed for eczema flares. Avoid applying to face, groin, and axilla. Use as directed. Risk of skin atrophy with long-term use reviewed., Disp: 45 g, Rfl: 2   mupirocin ointment (BACTROBAN) 2 %, Apply to scalp tid, Disp: 22 g, Rfl: 2   ondansetron (ZOFRAN-ODT) 4 MG disintegrating tablet, Take 1 tablet (4 mg total) by mouth every 8 (eight) hours as needed for nausea or vomiting., Disp: 20 tablet, Rfl: 2   polyvinyl alcohol (LIQUIFILM TEARS) 1.4 % ophthalmic solution, Place 1 drop into both eyes as needed for dry eyes. Reported on 06/18/2015, Disp: , Rfl:    propranolol (INDERAL) 20 MG tablet, Take 20 mg by mouth 3 (three) times daily., Disp: , Rfl:    Semaglutide,0.25 or 0.5MG /DOS, (OZEMPIC, 0.25 OR 0.5 MG/DOSE,) 2 MG/1.5ML SOPN, Inject 0.5 mg into the skin once a week., Disp: 4.5 mL, Rfl: 0   SYRINGE-NEEDLE, DISP, 3 ML 25G X 1" 3 ML MISC, Use to give b12, Disp: 100 each, Rfl: 0   tirzepatide (MOUNJARO) 5 MG/0.5ML Pen, Inject 5 mg into the skin once a week., Disp: , Rfl:    valACYclovir (VALTREX) 1000 MG tablet, TAKE 2 TABLETS BY MOUTH TWICE A DAY FOR 1 DAY AT ONSET OF COLD SORE. MAY REPEAT AS NECESSARY, Disp: 120 tablet, Rfl: 1   Vitamin D, Ergocalciferol, (DRISDOL) 1.25 MG (50000 UNIT) CAPS capsule, TAKE 1 CAPSULE BY MOUTH EVERY 7 DAYS, Disp: 12 capsule, Rfl: 0 No current facility-administered medications for this visit.  Facility-Administered Medications Ordered in Other Visits:    0.9 %  sodium chloride infusion, , Intravenous, Once PRN, Michaelyn Barter, MD, Stopped at 10/06/22 1050  Physical exam: There were no vitals filed for this visit. Physical Exam Constitutional:      Appearance: She is not ill-appearing.  Cardiovascular:     Rate and Rhythm: Normal rate and regular rhythm.  Pulmonary:     Effort: Pulmonary effort is normal. No respiratory distress.      Breath sounds: Normal breath sounds.  Skin:    Coloration: Skin is not pale.     Findings: No rash.     Comments: Pruritus  Neurological:     Mental Status: She is alert and oriented to person, place, and time.  Psychiatric:        Mood and Affect: Mood normal.        Behavior: Behavior normal.     No results found.  Assessment and plan- Patient is a 37 y.o. female diagnosed with iron deficiency, currently receiving IV Venofer for first time who experienced  itching post infusion:   Medication reaction- clinically consistent with grade 1 reaction. Likely hypersensitivity as opposed to allergic reaction. No hives, bronchospasm, angioedema. BP soft but consistent with baseline. No rash but is posiitve for pruritus. Grade 1. She has completed venofer infusion. She was given IV fluids and pepcid 20 mg IV. She has taken oral antihistamine/xyzal at home this morning for seasonal allergies. Symptoms resolved. I will send prescription for atarax which I reviewed is antihistamine which may help itching. May also cause drowsiness/sleep. If symptoms worsen, would recommend steroids. For now, we can plan to premedicate her with antihistamine at home (allegra/claritin/benadryl) prior to her next venofer infusion. If symptoms recur, would recommend premed with prednisone or dex.  Patient in agreement. Follow up as scheduled.    Visit Diagnosis 1. Medication reaction, initial encounter    Patient expressed understanding and was in agreement with this plan. She also understands that She can call clinic at any time with any questions, concerns, or complaints.   Thank you for allowing me to participate in the care of this very pleasant patient.   Consuello Masse, DNP, AGNP-C, AOCNP [facility] [phone]  CC: Dr Alena Bills

## 2022-10-11 ENCOUNTER — Inpatient Hospital Stay: Payer: Managed Care, Other (non HMO)

## 2022-10-11 VITALS — BP 98/60 | HR 73 | Temp 97.3°F | Resp 16

## 2022-10-11 DIAGNOSIS — R790 Abnormal level of blood mineral: Secondary | ICD-10-CM

## 2022-10-11 DIAGNOSIS — D509 Iron deficiency anemia, unspecified: Secondary | ICD-10-CM | POA: Diagnosis not present

## 2022-10-11 MED ORDER — SODIUM CHLORIDE 0.9 % IV SOLN
200.0000 mg | Freq: Once | INTRAVENOUS | Status: AC
  Administered 2022-10-11: 200 mg via INTRAVENOUS
  Filled 2022-10-11: qty 200

## 2022-10-11 MED ORDER — SODIUM CHLORIDE 0.9 % IV SOLN
Freq: Once | INTRAVENOUS | Status: AC
  Filled 2022-10-11: qty 250

## 2022-10-11 NOTE — Patient Instructions (Signed)
Iron Sucrose Injection What is this medication? IRON SUCROSE (EYE ern SOO krose) treats low levels of iron (iron deficiency anemia) in people with kidney disease. Iron is a mineral that plays an important role in making red blood cells, which carry oxygen from your lungs to the rest of your body. This medicine may be used for other purposes; ask your health care provider or pharmacist if you have questions. COMMON BRAND NAME(S): Venofer What should I tell my care team before I take this medication? They need to know if you have any of these conditions: Anemia not caused by low iron levels Heart disease High levels of iron in the blood Kidney disease Liver disease An unusual or allergic reaction to iron, other medications, foods, dyes, or preservatives Pregnant or trying to get pregnant Breastfeeding How should I use this medication? This medication is for infusion into a vein. It is given in a hospital or clinic setting. Talk to your care team about the use of this medication in children. While this medication may be prescribed for children as young as 2 years for selected conditions, precautions do apply. Overdosage: If you think you have taken too much of this medicine contact a poison control center or emergency room at once. NOTE: This medicine is only for you. Do not share this medicine with others. What if I miss a dose? Keep appointments for follow-up doses. It is important not to miss your dose. Call your care team if you are unable to keep an appointment. What may interact with this medication? Do not take this medication with any of the following: Deferoxamine Dimercaprol Other iron products This medication may also interact with the following: Chloramphenicol Deferasirox This list may not describe all possible interactions. Give your health care provider a list of all the medicines, herbs, non-prescription drugs, or dietary supplements you use. Also tell them if you smoke,  drink alcohol, or use illegal drugs. Some items may interact with your medicine. What should I watch for while using this medication? Visit your care team regularly. Tell your care team if your symptoms do not start to get better or if they get worse. You may need blood work done while you are taking this medication. You may need to follow a special diet. Talk to your care team. Foods that contain iron include: whole grains/cereals, dried fruits, beans, or peas, leafy green vegetables, and organ meats (liver, kidney). What side effects may I notice from receiving this medication? Side effects that you should report to your care team as soon as possible: Allergic reactions--skin rash, itching, hives, swelling of the face, lips, tongue, or throat Low blood pressure--dizziness, feeling faint or lightheaded, blurry vision Shortness of breath Side effects that usually do not require medical attention (report to your care team if they continue or are bothersome): Flushing Headache Joint pain Muscle pain Nausea Pain, redness, or irritation at injection site This list may not describe all possible side effects. Call your doctor for medical advice about side effects. You may report side effects to FDA at 1-800-FDA-1088. Where should I keep my medication? This medication is given in a hospital or clinic. It will not be stored at home. NOTE: This sheet is a summary. It may not cover all possible information. If you have questions about this medicine, talk to your doctor, pharmacist, or health care provider.  2024 Elsevier/Gold Standard (2022-05-28 00:00:00)

## 2022-10-19 ENCOUNTER — Inpatient Hospital Stay: Payer: Managed Care, Other (non HMO)

## 2022-10-19 VITALS — BP 101/66 | HR 76 | Temp 96.0°F | Resp 16

## 2022-10-19 DIAGNOSIS — R790 Abnormal level of blood mineral: Secondary | ICD-10-CM

## 2022-10-19 DIAGNOSIS — D509 Iron deficiency anemia, unspecified: Secondary | ICD-10-CM | POA: Diagnosis not present

## 2022-10-19 MED ORDER — SODIUM CHLORIDE 0.9 % IV SOLN
200.0000 mg | Freq: Once | INTRAVENOUS | Status: AC
  Administered 2022-10-19: 200 mg via INTRAVENOUS
  Filled 2022-10-19: qty 200
  Filled 2022-10-19: qty 10

## 2022-10-19 MED ORDER — SODIUM CHLORIDE 0.9 % IV SOLN
Freq: Once | INTRAVENOUS | Status: AC
  Filled 2022-10-19: qty 250

## 2022-10-19 NOTE — Progress Notes (Signed)
Declined 30 minute post-observation. Aware of risks. Vitals stable at discharge.

## 2022-10-19 NOTE — Patient Instructions (Signed)
Iron Sucrose Injection What is this medication? IRON SUCROSE (EYE ern SOO krose) treats low levels of iron (iron deficiency anemia) in people with kidney disease. Iron is a mineral that plays an important role in making red blood cells, which carry oxygen from your lungs to the rest of your body. This medicine may be used for other purposes; ask your health care provider or pharmacist if you have questions. COMMON BRAND NAME(S): Venofer What should I tell my care team before I take this medication? They need to know if you have any of these conditions: Anemia not caused by low iron levels Heart disease High levels of iron in the blood Kidney disease Liver disease An unusual or allergic reaction to iron, other medications, foods, dyes, or preservatives Pregnant or trying to get pregnant Breastfeeding How should I use this medication? This medication is for infusion into a vein. It is given in a hospital or clinic setting. Talk to your care team about the use of this medication in children. While this medication may be prescribed for children as young as 2 years for selected conditions, precautions do apply. Overdosage: If you think you have taken too much of this medicine contact a poison control center or emergency room at once. NOTE: This medicine is only for you. Do not share this medicine with others. What if I miss a dose? Keep appointments for follow-up doses. It is important not to miss your dose. Call your care team if you are unable to keep an appointment. What may interact with this medication? Do not take this medication with any of the following: Deferoxamine Dimercaprol Other iron products This medication may also interact with the following: Chloramphenicol Deferasirox This list may not describe all possible interactions. Give your health care provider a list of all the medicines, herbs, non-prescription drugs, or dietary supplements you use. Also tell them if you smoke,  drink alcohol, or use illegal drugs. Some items may interact with your medicine. What should I watch for while using this medication? Visit your care team regularly. Tell your care team if your symptoms do not start to get better or if they get worse. You may need blood work done while you are taking this medication. You may need to follow a special diet. Talk to your care team. Foods that contain iron include: whole grains/cereals, dried fruits, beans, or peas, leafy green vegetables, and organ meats (liver, kidney). What side effects may I notice from receiving this medication? Side effects that you should report to your care team as soon as possible: Allergic reactions--skin rash, itching, hives, swelling of the face, lips, tongue, or throat Low blood pressure--dizziness, feeling faint or lightheaded, blurry vision Shortness of breath Side effects that usually do not require medical attention (report to your care team if they continue or are bothersome): Flushing Headache Joint pain Muscle pain Nausea Pain, redness, or irritation at injection site This list may not describe all possible side effects. Call your doctor for medical advice about side effects. You may report side effects to FDA at 1-800-FDA-1088. Where should I keep my medication? This medication is given in a hospital or clinic. It will not be stored at home. NOTE: This sheet is a summary. It may not cover all possible information. If you have questions about this medicine, talk to your doctor, pharmacist, or health care provider.  2024 Elsevier/Gold Standard (2022-05-28 00:00:00)

## 2022-10-26 ENCOUNTER — Inpatient Hospital Stay: Payer: Managed Care, Other (non HMO)

## 2022-10-26 VITALS — BP 95/65 | HR 71 | Temp 96.9°F | Resp 18

## 2022-10-26 DIAGNOSIS — D509 Iron deficiency anemia, unspecified: Secondary | ICD-10-CM | POA: Diagnosis not present

## 2022-10-26 DIAGNOSIS — R790 Abnormal level of blood mineral: Secondary | ICD-10-CM

## 2022-10-26 MED ORDER — SODIUM CHLORIDE 0.9 % IV SOLN
200.0000 mg | Freq: Once | INTRAVENOUS | Status: AC
  Administered 2022-10-26: 200 mg via INTRAVENOUS
  Filled 2022-10-26: qty 200

## 2022-10-26 MED ORDER — SODIUM CHLORIDE 0.9% FLUSH
10.0000 mL | Freq: Once | INTRAVENOUS | Status: DC | PRN
Start: 2022-10-26 — End: 2022-10-26
  Filled 2022-10-26: qty 10

## 2022-10-26 MED ORDER — SODIUM CHLORIDE 0.9 % IV SOLN
Freq: Once | INTRAVENOUS | Status: AC
  Filled 2022-10-26: qty 250

## 2022-10-26 NOTE — Progress Notes (Signed)
During 30 minute observation patient started itching. Notified Dr Alena Bills. Darden Amber: We can discharge her after 30 minutes postobservation. Yes please give her instructions on to watch out for symptoms that needs ER evaluation such as shortness of breath, tongue swelling, lip swelling, chest pain. She is received 4 infusions so far. We can cancel the next week infusion. I will just see her in December as scheduled. Thank you   Patient aware and verbalizes understanding. Patient sent home. Vital signs stable.

## 2022-11-02 ENCOUNTER — Inpatient Hospital Stay: Payer: Managed Care, Other (non HMO)

## 2022-11-22 ENCOUNTER — Ambulatory Visit: Payer: Managed Care, Other (non HMO) | Admitting: Dermatology

## 2022-11-29 ENCOUNTER — Telehealth: Payer: Self-pay

## 2022-11-29 DIAGNOSIS — L301 Dyshidrosis [pompholyx]: Secondary | ICD-10-CM

## 2022-11-29 MED ORDER — MOMETASONE FUROATE 0.1 % EX CREA: TOPICAL_CREAM | CUTANEOUS | 2 refills | Status: AC

## 2022-11-29 NOTE — Telephone Encounter (Signed)
Pt called asking what the name of the medication was that Dr. Neale Burly prescribed for her hand dermatitis so her PCP could fill it for her. I gave her the name, and told her either our office could prescribe it or her PCP could, but if we prescribed it she would need to be seen once a year. Pt scheduled with Dr. Roseanne Reno in April 2025, and prescription sent to pharmacy.

## 2022-12-21 ENCOUNTER — Inpatient Hospital Stay: Payer: Managed Care, Other (non HMO)

## 2022-12-21 ENCOUNTER — Inpatient Hospital Stay: Payer: Managed Care, Other (non HMO) | Admitting: Internal Medicine

## 2023-03-16 ENCOUNTER — Encounter: Payer: Self-pay | Admitting: Internal Medicine

## 2023-03-16 ENCOUNTER — Ambulatory Visit (INDEPENDENT_AMBULATORY_CARE_PROVIDER_SITE_OTHER): Payer: Self-pay

## 2023-03-16 DIAGNOSIS — D509 Iron deficiency anemia, unspecified: Secondary | ICD-10-CM | POA: Diagnosis not present

## 2023-03-16 DIAGNOSIS — R894 Abnormal immunological findings in specimens from other organs, systems and tissues: Secondary | ICD-10-CM | POA: Diagnosis not present

## 2023-03-16 DIAGNOSIS — E538 Deficiency of other specified B group vitamins: Secondary | ICD-10-CM | POA: Diagnosis not present

## 2023-03-16 DIAGNOSIS — K3189 Other diseases of stomach and duodenum: Secondary | ICD-10-CM | POA: Diagnosis not present

## 2023-03-16 DIAGNOSIS — K2980 Duodenitis without bleeding: Secondary | ICD-10-CM | POA: Diagnosis not present

## 2023-03-16 DIAGNOSIS — K2950 Unspecified chronic gastritis without bleeding: Secondary | ICD-10-CM | POA: Diagnosis not present

## 2023-03-16 DIAGNOSIS — R197 Diarrhea, unspecified: Secondary | ICD-10-CM | POA: Diagnosis present

## 2023-04-11 ENCOUNTER — Ambulatory Visit: Payer: Managed Care, Other (non HMO) | Admitting: Dermatology

## 2023-09-07 ENCOUNTER — Encounter: Payer: Self-pay | Admitting: Internal Medicine

## 2023-09-23 ENCOUNTER — Ambulatory Visit

## 2023-11-30 ENCOUNTER — Encounter: Payer: Self-pay | Admitting: Internal Medicine

## 2023-11-30 ENCOUNTER — Ambulatory Visit: Admitting: Family Medicine

## 2023-11-30 VITALS — BP 96/62 | HR 90 | Ht 67.0 in | Wt 138.6 lb

## 2023-11-30 DIAGNOSIS — M359 Systemic involvement of connective tissue, unspecified: Secondary | ICD-10-CM

## 2023-11-30 DIAGNOSIS — Z8639 Personal history of other endocrine, nutritional and metabolic disease: Secondary | ICD-10-CM

## 2023-11-30 DIAGNOSIS — E538 Deficiency of other specified B group vitamins: Secondary | ICD-10-CM

## 2023-11-30 DIAGNOSIS — F909 Attention-deficit hyperactivity disorder, unspecified type: Secondary | ICD-10-CM

## 2023-11-30 DIAGNOSIS — Z90710 Acquired absence of both cervix and uterus: Secondary | ICD-10-CM

## 2023-11-30 DIAGNOSIS — F418 Other specified anxiety disorders: Secondary | ICD-10-CM

## 2023-11-30 DIAGNOSIS — G4719 Other hypersomnia: Secondary | ICD-10-CM

## 2023-11-30 DIAGNOSIS — Z6821 Body mass index (BMI) 21.0-21.9, adult: Secondary | ICD-10-CM

## 2023-11-30 DIAGNOSIS — E611 Iron deficiency: Secondary | ICD-10-CM

## 2023-11-30 DIAGNOSIS — M357 Hypermobility syndrome: Secondary | ICD-10-CM

## 2023-11-30 NOTE — Progress Notes (Unsigned)
 1. Excessive daytime sleepiness   2. Attention deficit hyperactivity disorder (ADHD), other type, with BED   3. Situational anxiety   4. Undifferentiated connective tissue disease, Dx 2024 [id number] ANA IFA, anti-chromatin 30, noted positive anti-[id number] of 41   5. B12 deficiency, Rx IM B12   6. History of hysterectomy, 2015   7. Iron  deficiency, req iron  infusions, followed by Hematology   8. Musculoskeletal hypermobility   9. Hx of obesity   10. Normal weight with body mass index (BMI) of 21.0 to 21.9 in adult    Meds ordered this encounter  Medications   lisdexamfetamine (VYVANSE ) 70 MG capsule    Sig: Take 1 capsule (70 mg total) by mouth every morning.    Dispense:  30 capsule    Refill:  0   escitalopram  (LEXAPRO ) 20 MG tablet    Sig: Take 1 tablet (20 mg total) by mouth daily.    Dispense:  90 tablet    Refill:  3   Orders Placed This Encounter  Procedures   CBC With Diff/Platelet   Comprehensive metabolic panel with GFR   TSH   Lipid panel   Assessment and Plan Assessment & Plan Attention-deficit hyperactivity disorder (ADHD) ADHD managed with Vyvanse . Medication lapse led to increased fatigue, agitation, and difficulty focusing. Symptoms improved upon resuming Vyvanse . - Refilled Vyvanse  prescription.  Chronic fatigue Chronic fatigue persists, worsened by office work. Fatigue impacts daily functioning and work performance. Zio patch and Dexcom monitors were negative for acute concerns.  - Consider sleep study to evaluate for narcolepsy or other sleep abnormality.  - Draft letter for potential work-from-home waiver.  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 Previous labs for Ehlers-Danlos syndrome negative. Rheumatologist has not provided a definitive diagnosis. Potential involvement of dysautonomia and hormonal factors. - Consider consult with Dr. Joane [facility])  for further evaluation and potential diagnosis.  Orthostatic hypotension Episodes of dizziness and near-syncope upon standing, with significant blood pressure drop. Symptoms suggestive of dysautonomia, possibly related to Ehlers-Danlos syndrome. Zio patch evaluation without arrhythmias.  - Continue to monitor blood pressure and symptoms.  Polycystic ovary syndrome (PCOS) status post hysterectomy PCOS with severe menstrual bleeding prior to hysterectomy. Current symptoms include changes in vaginal discharge. Hormonal changes suspected to contribute to fatigue and other symptoms. - Continue to monitor symptoms and consider evaluation if symptoms persist.  General Health Maintenance Routine health maintenance discussed, including vaccinations and screenings. No recent flu shot administered. - Offered flu shot.  Geni Shutter, DO, MS, FAAFP, Dipl. [facility] [address] [place], [address] Dept: [phone] Dept Fax: [phone]  Subjective:   Patient is well known to me from my previous clinic and is now establishing care with me as their primary care provider.  Discussed the use of AI scribe software for clinical note transcription with the patient, who gave verbal consent to proceed. History of Present Illness WILLINE Hernandez is a 38 year old female with undifferentiated connective tissue disease who presents with chronic fatigue and dizziness.  Fatigue and somnolence - Chronic fatigue with episodes of extreme exhaustion - Frequently sleeps over 13 hours on weekends without feeling rested - Physical activity exacerbates fatigue - Working from home is less tiring than other activities - Increased fatigue during three weeks without Vyvanse  due to recall - Vyvanse  improves wakefulness  Orthostatic dizziness and hypotension - Dizziness when transitioning from sitting or lying down  to standing - Associated with visual  disturbances and sensation of being 'almost drunk' - Low blood pressure during positional changes  Appetite and weight changes - Decreased appetite - Uses protein shakes for nutrition - Weight increased from 126 lbs to the 130s, she is happy at this weight  Neurodevelopmental symptoms - ADHD symptoms improved with Vyvanse  - Increased agitation and fatigue during period without Vyvanse   Connective tissue and joint hypermobility - Undifferentiated connective tissue disease - Joint hypermobility present - Negative testing for Ehlers-Danlos syndrome, but meets many clinical criteria  Past medical history, surgical history, allergies, family history, immunizations andmedications were updated in the EMR today and reviewed under the history and medication portions of their EMR. Review of Systems: Negative, with the exception of above mentioned in HPI.  Current Outpatient Medications:    BIOTIN PO, Take by mouth., Disp: , Rfl:    cyanocobalamin  (,VITAMIN B-12,) 1000 MCG/ML injection, Inject weekly x 4 weeks, then every 30 days after, Disp: 10 mL, Rfl: 0   Lactobacillus-Inulin (CULTURELLE DIGESTIVE DAILY PO), Take 1 tablet by mouth daily., Disp: , Rfl:    levocetirizine (XYZAL) 5 MG tablet, Take 5 mg by mouth every evening., Disp: , Rfl:    mometasone  (ELOCON ) 0.1 % cream, Apply twice daily to hands as needed for eczema flares. Avoid applying to face, groin, and axilla. Use as directed. Risk of skin atrophy with long-term use reviewed., Disp: 45 g, Rfl: 2   ondansetron  (ZOFRAN -ODT) 4 MG disintegrating tablet, Take 1 tablet (4 mg total) by mouth every 8 (eight) hours as needed for nausea or vomiting., Disp: 20 tablet, Rfl: 2   polyvinyl alcohol (LIQUIFILM TEARS) 1.4 % ophthalmic solution, Place 1 drop into both eyes as needed for dry eyes. Reported on 06/18/2015, Disp: , Rfl:    propranolol (INDERAL) 20 MG tablet, Take 20 mg by mouth 3 (three) times daily., Disp: , Rfl:    SYRINGE-NEEDLE, DISP, 3 ML  25G X 1 3 ML MISC, Use to give b12, Disp: 100 each, Rfl: 0   tirzepatide (MOUNJARO) 2.5 MG/0.5ML Pen, Inject 2.5 mg into the skin once a week., Disp: , Rfl:    valACYclovir  (VALTREX ) 1000 MG tablet, TAKE 2 TABLETS BY MOUTH TWICE A DAY FOR 1 DAY AT ONSET OF COLD SORE. MAY REPEAT AS NECESSARY, Disp: 120 tablet, Rfl: 1   Vitamin D , Ergocalciferol , (DRISDOL ) 1.25 MG (50000 UNIT) CAPS capsule, TAKE 1 CAPSULE BY MOUTH EVERY 7 DAYS, Disp: 12 capsule, Rfl: 0   escitalopram  (LEXAPRO ) 20 MG tablet, Take 1 tablet (20 mg total) by mouth daily., Disp: 90 tablet, Rfl: 3   lisdexamfetamine (VYVANSE ) 70 MG capsule, Take 1 capsule (70 mg total) by mouth every morning., Disp: 30 capsule, Rfl: 0   Objective:   BP 96/62 (BP Location: Right Arm, Cuff Size: Normal)   Pulse 90   Ht 5' 7 (1.702 m)   Wt 138 lb 9.6 oz (62.9 kg)   LMP 03/23/2013 Comment: DUB  SpO2 98%   BMI 21.71 kg/m   Wt Readings from Last 3 Encounters:  11/30/23 138 lb 9.6 oz (62.9 kg)  09/28/22 163 lb (73.9 kg)  03/10/21 179 lb (81.2 kg)   Physical Exam Vitals and nursing note reviewed.  HENT:     Head: Normocephalic and atraumatic.  Eyes:     Pupils: Pupils are equal, round, and reactive to light.  Cardiovascular:     Rate and Rhythm: Normal rate and regular rhythm.     Heart sounds: Normal  heart sounds.  Pulmonary:     Effort: Pulmonary effort is normal.  Abdominal:     Palpations: Abdomen is soft.  Musculoskeletal:     Cervical back: Normal range of motion and neck supple.  Skin:    General: Skin is warm.  Psychiatric:        Behavior: Behavior normal.

## 2023-12-01 DIAGNOSIS — F909 Attention-deficit hyperactivity disorder, unspecified type: Secondary | ICD-10-CM | POA: Insufficient documentation

## 2023-12-01 DIAGNOSIS — Z8639 Personal history of other endocrine, nutritional and metabolic disease: Secondary | ICD-10-CM | POA: Insufficient documentation

## 2023-12-01 DIAGNOSIS — M357 Hypermobility syndrome: Secondary | ICD-10-CM | POA: Insufficient documentation

## 2023-12-01 DIAGNOSIS — E611 Iron deficiency: Secondary | ICD-10-CM | POA: Insufficient documentation

## 2023-12-01 DIAGNOSIS — G4719 Other hypersomnia: Secondary | ICD-10-CM | POA: Insufficient documentation

## 2023-12-01 DIAGNOSIS — D531 Other megaloblastic anemias, not elsewhere classified: Secondary | ICD-10-CM | POA: Insufficient documentation

## 2023-12-01 DIAGNOSIS — Z90710 Acquired absence of both cervix and uterus: Secondary | ICD-10-CM | POA: Insufficient documentation

## 2023-12-01 DIAGNOSIS — M359 Systemic involvement of connective tissue, unspecified: Secondary | ICD-10-CM | POA: Insufficient documentation

## 2023-12-01 DIAGNOSIS — Z6820 Body mass index (BMI) 20.0-20.9, adult: Secondary | ICD-10-CM | POA: Insufficient documentation

## 2023-12-01 DIAGNOSIS — Z8349 Family history of other endocrine, nutritional and metabolic diseases: Secondary | ICD-10-CM | POA: Insufficient documentation

## 2023-12-01 DIAGNOSIS — Z6821 Body mass index (BMI) 21.0-21.9, adult: Secondary | ICD-10-CM | POA: Insufficient documentation

## 2023-12-01 MED ORDER — LISDEXAMFETAMINE DIMESYLATE 70 MG PO CAPS: 70.0000 mg | ORAL_CAPSULE | Freq: Every morning | ORAL | 0 refills | Status: DC

## 2023-12-01 MED ORDER — ESCITALOPRAM OXALATE 20 MG PO TABS: 20.0000 mg | ORAL_TABLET | Freq: Every day | ORAL | 3 refills | Status: AC

## 2023-12-07 ENCOUNTER — Ambulatory Visit: Admitting: Family Medicine

## 2024-01-11 ENCOUNTER — Telehealth: Payer: Self-pay | Admitting: Family Medicine

## 2024-01-11 ENCOUNTER — Ambulatory Visit: Admitting: Family Medicine

## 2024-01-11 DIAGNOSIS — F909 Attention-deficit hyperactivity disorder, unspecified type: Secondary | ICD-10-CM

## 2024-01-11 NOTE — Telephone Encounter (Signed)
 Refill request received for Vyvanse  70mg  FOV: 01/17/2024 LOV: 11/30/2023 Last refill: 12/01/2023 Medication is pending your approval.

## 2024-01-11 NOTE — Telephone Encounter (Signed)
 Pt has had to reschedule for pcp. She will be out of her lisdexamfetamine  (VYVANSE ) 70 MG capsule [[id number]] and would like to know if she can get a refill until Tuesday 13th.  [id number] [place], [place] - [address] AT [facility] [address] [facility] [address] [place], [place] [address] Phone: [phone]  Fax: [phone]

## 2024-01-13 MED ORDER — LISDEXAMFETAMINE DIMESYLATE 70 MG PO CAPS: 70.0000 mg | ORAL_CAPSULE | Freq: Every morning | ORAL | 0 refills | Status: AC

## 2024-01-17 ENCOUNTER — Ambulatory Visit: Admitting: Family Medicine

## 2024-01-18 ENCOUNTER — Ambulatory Visit: Admitting: Family Medicine

## 2024-01-18 ENCOUNTER — Encounter: Payer: Self-pay | Admitting: Family Medicine

## 2024-01-18 VITALS — BP 96/60 | HR 73 | Ht 67.0 in | Wt 129.8 lb

## 2024-01-18 DIAGNOSIS — M359 Systemic involvement of connective tissue, unspecified: Secondary | ICD-10-CM

## 2024-01-18 DIAGNOSIS — Z6820 Body mass index (BMI) 20.0-20.9, adult: Secondary | ICD-10-CM

## 2024-01-18 DIAGNOSIS — Z8639 Personal history of other endocrine, nutritional and metabolic disease: Secondary | ICD-10-CM

## 2024-01-18 DIAGNOSIS — G9332 Myalgic encephalomyelitis/chronic fatigue syndrome: Secondary | ICD-10-CM

## 2024-01-18 DIAGNOSIS — I951 Orthostatic hypotension: Secondary | ICD-10-CM

## 2024-01-18 DIAGNOSIS — E611 Iron deficiency: Secondary | ICD-10-CM

## 2024-01-18 DIAGNOSIS — L509 Urticaria, unspecified: Secondary | ICD-10-CM

## 2024-01-18 DIAGNOSIS — F909 Attention-deficit hyperactivity disorder, unspecified type: Secondary | ICD-10-CM

## 2024-01-18 DIAGNOSIS — Q796 Ehlers-Danlos syndrome, unspecified: Secondary | ICD-10-CM

## 2024-01-18 NOTE — Progress Notes (Signed)
 "    Patient Care Team: Prentiss Frieze, DO as PCP - General (Family Medicine) Vivienne Delon HERO, PA-C as Physician Assistant (Family Medicine) Dessa Reyes ORN, MD as Consulting Physician (General Surgery) Agrawal, Kavita, MD as Consulting Physician (Oncology)  Diagnoses and Orders:   1. EDS (Ehlers-Danlos syndrome), hypermobility type (presumptive)   2. Attention deficit hyperactivity disorder (ADHD), other type, with BED   3. Undifferentiated connective tissue disease, Dx 2024 [id number] ANA IFA, anti-chromatin 30, noted positive anti-DFS70 of 41   4. Iron  deficiency, req iron  infusions, followed by Hematology   5. Myalgic encephalomyelitis/chronic fatigue syndrome (ME/CFS)   6. Hives   7. Orthostatic hypotension   8. Hx of obesity   9. Normal weight with body mass index (BMI) of 20.0 to 20.9 in adult    Orders Placed This Encounter  Procedures   Ambulatory referral to Allergy   Assessment & Plan:   Assessment & Plan Ehlers-Danlos syndrome, hypermobility type (presumptive) Presumptive diagnosis based on clinical evaluation. Negative testing reduces likelihood of vascular complications. - Documented EDS with presumptive diagnosis based clinically. - Ensured emergency information is available for hospital situations.  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  - Continue antihistamines and Singulair.  Chronic fatigue Significant fatigue with improvement after naps.  - Encouraged regular naps for energy management. - Consider sleep study if fatigue persists.  Attention deficit hyperactivity disorder (ADHD), other type, with binge eating disorder (BED) ADHD managed with Vyvanse . Binge eating disorder noted. - Continue Vyvanse  70 mg daily.  Orthostatic hypotension Symptoms include dizziness and racing heart upon standing.  - Continue to monitor symptoms and manage conservatively.  Polycystic ovary syndrome  status post hysterectomy PCOS with hysterectomy due to severe endometriosis and bleeding. - Continue monitoring for any related symptoms.  Subjective:   History of Present Illness Debra Hernandez is a 39 year old female with hypermobility syndrome who presents for evaluation of symptoms related to Ehlers-Danlos Syndrome (EDS) and associated conditions.  Joint hypermobility and chronic pain - Hypermobility syndrome with chronic joint pain under evaluation for Ehlers-Danlos Syndrome (EDS) - Concern for vascular complications, though prior vascular testing was negative - Requests documentation of EDS in chart to guide emergency care  Orthostatic intolerance and fatigue - Orthostatic intolerance with tachycardia upon standing - Significant fatigue requiring daytime naps, which provide temporary relief  Allergic symptoms and mast cell activation concerns - Hives and flushing with exertion - Uses Xyzal for allergies - Has prescriptions for stronger antihistamines - No formal evaluation for mast cell activation syndrome  Gynecologic history - Endometriosis status post hysterectomy for severe pain and bleeding - Polycystic ovary syndrome (PCOS)  Nutritional status and weight changes - Difficulty maintaining muscle mass and weight, BMI 20 - Poor appetite since COVID infection, limiting nutrition - Attempting to increase protein intake  Attention deficit hyperactivity disorder (adhd) - Vyvanse  70 mg daily improves wakefulness  Gastrointestinal symptoms related to medication - Uses Mounjaro at lowest dose every ten days - Experiences stomach upset as she approaches next dose  Review of Systems: Negative, with the exception of above mentioned in HPI.  History:   Reviewed by clinician on day of visit: allergies, medications, problem list, medical history, surgical history, family history, social history, and previous encounter notes.  Medications:   Show/hide medication  list[1] Allergies[2]  Objective:   BP 96/60 (BP Location: Left Arm, Cuff Size: Normal)   Pulse 73   Ht 5' 7 (1.702 m)   Wt  129 lb 12.8 oz (58.9 kg)   LMP 03/23/2013 Comment: DUB  SpO2 99%   BMI 20.33 kg/m   Physical Exam Constitutional:      General: She is not in acute distress.    Appearance: She is well-developed.  HENT:     Head: Normocephalic and atraumatic.  Eyes:     Conjunctiva/sclera: Conjunctivae normal.  Cardiovascular:     Rate and Rhythm: Normal rate and regular rhythm.     Heart sounds: Normal heart sounds.  Pulmonary:     Effort: Pulmonary effort is normal.     Breath sounds: Normal breath sounds.  Neurological:     General: No focal deficit present.     Mental Status: She is alert.  Psychiatric:        Behavior: Behavior normal.    Geni Shutter, DO, MS (Nutrition), FAAFP, Dipl. ABOM Fellow, American Academy of [employer] Physicians Diplomate, American Board of Obesity Medicine [facility] [address] [place], [place] [address] Dept: [phone] Fax: [phone]    [1]  Outpatient Medications Prior to Visit  Medication Sig   BIOTIN PO Take by mouth.   cyanocobalamin  (,VITAMIN B-12,) 1000 MCG/ML injection Inject weekly x 4 weeks, then every 30 days after   escitalopram  (LEXAPRO ) 20 MG tablet Take 1 tablet (20 mg total) by mouth daily.   Lactobacillus-Inulin (CULTURELLE DIGESTIVE DAILY PO) Take 1 tablet by mouth daily.   levocetirizine (XYZAL) 5 MG tablet Take 5 mg by mouth every evening.   lisdexamfetamine  (VYVANSE ) 70 MG capsule Take 1 capsule (70 mg total) by mouth every morning.   mometasone  (ELOCON ) 0.1 % cream Apply twice daily to hands as needed for eczema flares. Avoid applying to face, groin, and axilla. Use as directed. Risk of skin atrophy with long-term use reviewed.   ondansetron  (ZOFRAN -ODT) 4 MG disintegrating tablet Take 1 tablet (4 mg total) by mouth every 8 (eight) hours as  needed for nausea or vomiting.   polyvinyl alcohol (LIQUIFILM TEARS) 1.4 % ophthalmic solution Place 1 drop into both eyes as needed for dry eyes. Reported on 06/18/2015   propranolol (INDERAL) 20 MG tablet Take 20 mg by mouth 3 (three) times daily.   SYRINGE-NEEDLE, DISP, 3 ML 25G X 1 3 ML MISC Use to give b12   tirzepatide [facility]) 2.5 MG/0.5ML Pen Inject 2.5 mg into the skin once a week.   valACYclovir  (VALTREX ) 1000 MG tablet TAKE 2 TABLETS BY MOUTH TWICE A DAY FOR 1 DAY AT ONSET OF COLD SORE. MAY REPEAT AS NECESSARY   Vitamin D , Ergocalciferol , (DRISDOL ) 1.25 MG (50000 UNIT) CAPS capsule TAKE 1 CAPSULE BY MOUTH EVERY 7 DAYS   No facility-administered medications prior to visit.  [2]  Allergies Allergen Reactions   Atomoxetine Other (See Comments)   Monistat [Miconazole] Swelling    Swelling shut in the vaginal area per patient   Strattera [Atomoxetine Hcl] Nausea And Vomiting and Other (See Comments)    insomnia   Venofer  [Iron  Sucrose] Itching    Pruritus after venofer  infusion on 10/06/22. Plan to proceed with antihistamine as pre-med.    Wound Dressing Adhesive Rash   "

## 2024-01-31 DIAGNOSIS — Q796 Ehlers-Danlos syndrome, unspecified: Secondary | ICD-10-CM | POA: Insufficient documentation

## 2024-01-31 DIAGNOSIS — I951 Orthostatic hypotension: Secondary | ICD-10-CM | POA: Insufficient documentation

## 2024-03-13 ENCOUNTER — Ambulatory Visit: Admitting: Family Medicine
